# Patient Record
Sex: Female | Born: 1965 | Race: Black or African American | Hispanic: No | Marital: Married | Smoking: Never smoker
Health system: Southern US, Community
[De-identification: ages and names within clinical notes are randomized; demographics above are authoritative.]

## PROBLEM LIST (undated history)

## (undated) DIAGNOSIS — R042 Hemoptysis: Secondary | ICD-10-CM

## (undated) DIAGNOSIS — M109 Gout, unspecified: Secondary | ICD-10-CM

## (undated) DIAGNOSIS — I10 Essential (primary) hypertension: Secondary | ICD-10-CM

## (undated) DIAGNOSIS — E785 Hyperlipidemia, unspecified: Secondary | ICD-10-CM

## (undated) DIAGNOSIS — N12 Tubulo-interstitial nephritis, not specified as acute or chronic: Secondary | ICD-10-CM

## (undated) DIAGNOSIS — A419 Sepsis, unspecified organism: Secondary | ICD-10-CM

## (undated) DIAGNOSIS — E119 Type 2 diabetes mellitus without complications: Secondary | ICD-10-CM

## (undated) DIAGNOSIS — E669 Obesity, unspecified: Secondary | ICD-10-CM

## (undated) DIAGNOSIS — D573 Sickle-cell trait: Secondary | ICD-10-CM

## (undated) HISTORY — DX: Gout, unspecified: M10.9

## (undated) HISTORY — DX: Essential (primary) hypertension: I10

## (undated) HISTORY — DX: Obesity, unspecified: E66.9

---

## 2000-10-15 ENCOUNTER — Encounter: Admission: RE | Admit: 2000-10-15 | Discharge: 2000-10-15 | Payer: Self-pay | Admitting: Obstetrics

## 2000-10-23 ENCOUNTER — Ambulatory Visit (HOSPITAL_COMMUNITY): Admission: RE | Admit: 2000-10-23 | Discharge: 2000-10-23 | Payer: Self-pay | Admitting: Obstetrics

## 2000-10-29 ENCOUNTER — Encounter: Admission: RE | Admit: 2000-10-29 | Discharge: 2000-10-29 | Payer: Self-pay | Admitting: Obstetrics

## 2000-11-12 ENCOUNTER — Inpatient Hospital Stay (HOSPITAL_COMMUNITY): Admission: AD | Admit: 2000-11-12 | Discharge: 2000-11-12 | Payer: Self-pay | Admitting: Obstetrics & Gynecology

## 2000-11-19 ENCOUNTER — Encounter: Admission: RE | Admit: 2000-11-19 | Discharge: 2000-11-19 | Payer: Self-pay | Admitting: Obstetrics

## 2000-12-17 ENCOUNTER — Encounter: Admission: RE | Admit: 2000-12-17 | Discharge: 2000-12-17 | Payer: Self-pay | Admitting: Obstetrics

## 2000-12-31 ENCOUNTER — Encounter: Admission: RE | Admit: 2000-12-31 | Discharge: 2000-12-31 | Payer: Self-pay | Admitting: Obstetrics

## 2001-01-12 ENCOUNTER — Ambulatory Visit (HOSPITAL_COMMUNITY): Admission: RE | Admit: 2001-01-12 | Discharge: 2001-01-12 | Payer: Self-pay | Admitting: Obstetrics & Gynecology

## 2001-01-14 ENCOUNTER — Encounter: Admission: RE | Admit: 2001-01-14 | Discharge: 2001-01-14 | Payer: Self-pay | Admitting: Obstetrics

## 2001-01-19 ENCOUNTER — Encounter: Admission: RE | Admit: 2001-01-19 | Discharge: 2001-01-19 | Payer: Self-pay | Admitting: Internal Medicine

## 2001-01-28 ENCOUNTER — Encounter: Admission: RE | Admit: 2001-01-28 | Discharge: 2001-04-28 | Payer: Self-pay | Admitting: Obstetrics & Gynecology

## 2001-01-28 ENCOUNTER — Encounter (HOSPITAL_COMMUNITY): Admission: RE | Admit: 2001-01-28 | Discharge: 2001-02-27 | Payer: Self-pay | Admitting: Obstetrics

## 2001-01-28 ENCOUNTER — Encounter: Admission: RE | Admit: 2001-01-28 | Discharge: 2001-01-28 | Payer: Self-pay | Admitting: Obstetrics

## 2001-02-03 ENCOUNTER — Encounter: Admission: RE | Admit: 2001-02-03 | Discharge: 2001-02-03 | Payer: Self-pay | Admitting: Obstetrics & Gynecology

## 2001-02-05 ENCOUNTER — Inpatient Hospital Stay (HOSPITAL_COMMUNITY): Admission: AD | Admit: 2001-02-05 | Discharge: 2001-02-08 | Payer: Self-pay | Admitting: *Deleted

## 2001-02-08 ENCOUNTER — Encounter: Payer: Self-pay | Admitting: *Deleted

## 2001-02-10 ENCOUNTER — Encounter: Admission: RE | Admit: 2001-02-10 | Discharge: 2001-02-10 | Payer: Self-pay | Admitting: Obstetrics & Gynecology

## 2001-02-17 ENCOUNTER — Encounter: Admission: RE | Admit: 2001-02-17 | Discharge: 2001-02-17 | Payer: Self-pay | Admitting: Obstetrics & Gynecology

## 2001-02-24 ENCOUNTER — Encounter: Admission: RE | Admit: 2001-02-24 | Discharge: 2001-02-24 | Payer: Self-pay | Admitting: Obstetrics & Gynecology

## 2001-03-03 ENCOUNTER — Encounter: Admission: RE | Admit: 2001-03-03 | Discharge: 2001-03-03 | Payer: Self-pay | Admitting: Obstetrics & Gynecology

## 2001-03-04 ENCOUNTER — Inpatient Hospital Stay (HOSPITAL_COMMUNITY): Admission: AD | Admit: 2001-03-04 | Discharge: 2001-03-07 | Payer: Self-pay | Admitting: *Deleted

## 2004-08-18 HISTORY — PX: TUBAL LIGATION: SHX77

## 2004-12-25 ENCOUNTER — Ambulatory Visit: Payer: Self-pay | Admitting: *Deleted

## 2005-01-01 ENCOUNTER — Ambulatory Visit: Payer: Self-pay | Admitting: Family Medicine

## 2005-01-03 ENCOUNTER — Ambulatory Visit: Payer: Self-pay | Admitting: *Deleted

## 2005-01-08 ENCOUNTER — Ambulatory Visit: Payer: Self-pay | Admitting: Family Medicine

## 2005-01-15 ENCOUNTER — Ambulatory Visit: Payer: Self-pay | Admitting: *Deleted

## 2005-01-22 ENCOUNTER — Ambulatory Visit: Payer: Self-pay | Admitting: *Deleted

## 2005-01-29 ENCOUNTER — Ambulatory Visit: Payer: Self-pay | Admitting: Obstetrics & Gynecology

## 2005-02-04 ENCOUNTER — Ambulatory Visit (HOSPITAL_COMMUNITY): Admission: RE | Admit: 2005-02-04 | Discharge: 2005-02-04 | Payer: Self-pay | Admitting: *Deleted

## 2005-02-05 ENCOUNTER — Ambulatory Visit: Payer: Self-pay | Admitting: *Deleted

## 2005-02-19 ENCOUNTER — Ambulatory Visit: Payer: Self-pay | Admitting: *Deleted

## 2005-03-05 ENCOUNTER — Ambulatory Visit: Payer: Self-pay | Admitting: *Deleted

## 2005-03-12 ENCOUNTER — Ambulatory Visit: Payer: Self-pay | Admitting: Obstetrics & Gynecology

## 2005-03-19 ENCOUNTER — Ambulatory Visit: Payer: Self-pay | Admitting: Obstetrics & Gynecology

## 2005-03-26 ENCOUNTER — Ambulatory Visit: Payer: Self-pay | Admitting: Obstetrics & Gynecology

## 2005-04-02 ENCOUNTER — Ambulatory Visit: Payer: Self-pay | Admitting: Family Medicine

## 2005-04-07 ENCOUNTER — Ambulatory Visit (HOSPITAL_COMMUNITY): Admission: RE | Admit: 2005-04-07 | Discharge: 2005-04-07 | Payer: Self-pay | Admitting: *Deleted

## 2005-04-09 ENCOUNTER — Ambulatory Visit: Payer: Self-pay | Admitting: *Deleted

## 2005-04-11 ENCOUNTER — Ambulatory Visit: Payer: Self-pay | Admitting: *Deleted

## 2005-04-14 ENCOUNTER — Ambulatory Visit (HOSPITAL_COMMUNITY): Admission: RE | Admit: 2005-04-14 | Discharge: 2005-04-14 | Payer: Self-pay | Admitting: *Deleted

## 2005-04-16 ENCOUNTER — Ambulatory Visit: Payer: Self-pay | Admitting: *Deleted

## 2005-04-18 ENCOUNTER — Ambulatory Visit: Payer: Self-pay | Admitting: *Deleted

## 2005-04-18 ENCOUNTER — Ambulatory Visit (HOSPITAL_COMMUNITY): Admission: RE | Admit: 2005-04-18 | Discharge: 2005-04-18 | Payer: Self-pay | Admitting: *Deleted

## 2005-04-23 ENCOUNTER — Ambulatory Visit: Payer: Self-pay | Admitting: *Deleted

## 2005-04-23 ENCOUNTER — Ambulatory Visit (HOSPITAL_COMMUNITY): Admission: RE | Admit: 2005-04-23 | Discharge: 2005-04-23 | Payer: Self-pay | Admitting: *Deleted

## 2005-04-25 ENCOUNTER — Ambulatory Visit (HOSPITAL_COMMUNITY): Admission: RE | Admit: 2005-04-25 | Discharge: 2005-04-25 | Payer: Self-pay | Admitting: *Deleted

## 2005-04-25 ENCOUNTER — Ambulatory Visit: Payer: Self-pay | Admitting: *Deleted

## 2005-04-30 ENCOUNTER — Ambulatory Visit: Payer: Self-pay | Admitting: *Deleted

## 2005-05-02 ENCOUNTER — Ambulatory Visit: Payer: Self-pay | Admitting: *Deleted

## 2005-05-02 ENCOUNTER — Ambulatory Visit (HOSPITAL_COMMUNITY): Admission: RE | Admit: 2005-05-02 | Discharge: 2005-05-02 | Payer: Self-pay | Admitting: *Deleted

## 2005-05-07 ENCOUNTER — Ambulatory Visit: Payer: Self-pay | Admitting: Family Medicine

## 2005-05-09 ENCOUNTER — Ambulatory Visit: Payer: Self-pay | Admitting: *Deleted

## 2005-05-09 ENCOUNTER — Ambulatory Visit (HOSPITAL_COMMUNITY): Admission: RE | Admit: 2005-05-09 | Discharge: 2005-05-09 | Payer: Self-pay | Admitting: *Deleted

## 2005-05-14 ENCOUNTER — Ambulatory Visit: Payer: Self-pay | Admitting: *Deleted

## 2005-05-16 ENCOUNTER — Ambulatory Visit: Payer: Self-pay | Admitting: *Deleted

## 2005-05-16 ENCOUNTER — Ambulatory Visit (HOSPITAL_COMMUNITY): Admission: RE | Admit: 2005-05-16 | Discharge: 2005-05-16 | Payer: Self-pay | Admitting: Family Medicine

## 2005-05-18 ENCOUNTER — Emergency Department (HOSPITAL_COMMUNITY): Admission: EM | Admit: 2005-05-18 | Discharge: 2005-05-18 | Payer: Self-pay | Admitting: *Deleted

## 2005-05-21 ENCOUNTER — Ambulatory Visit: Payer: Self-pay | Admitting: Family Medicine

## 2005-05-21 ENCOUNTER — Inpatient Hospital Stay (HOSPITAL_COMMUNITY): Admission: AD | Admit: 2005-05-21 | Discharge: 2005-05-25 | Payer: Self-pay | Admitting: Family Medicine

## 2005-05-21 ENCOUNTER — Ambulatory Visit: Payer: Self-pay | Admitting: *Deleted

## 2005-05-21 ENCOUNTER — Encounter: Payer: Self-pay | Admitting: Orthopedic Surgery

## 2005-05-24 ENCOUNTER — Encounter (INDEPENDENT_AMBULATORY_CARE_PROVIDER_SITE_OTHER): Payer: Self-pay | Admitting: Specialist
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
## 2007-07-05 ENCOUNTER — Emergency Department (HOSPITAL_COMMUNITY): Admission: EM | Admit: 2007-07-05 | Discharge: 2007-07-05 | Payer: Self-pay | Admitting: *Deleted

## 2007-07-05 ENCOUNTER — Other Ambulatory Visit: Payer: Self-pay | Admitting: Obstetrics and Gynecology

## 2010-06-28 ENCOUNTER — Emergency Department (HOSPITAL_COMMUNITY): Admission: EM | Admit: 2010-06-28 | Discharge: 2010-06-28 | Payer: Self-pay | Admitting: Emergency Medicine

## 2010-09-07 ENCOUNTER — Encounter: Payer: Self-pay | Admitting: *Deleted

## 2010-09-08 ENCOUNTER — Encounter: Payer: Self-pay | Admitting: *Deleted

## 2010-11-29 ENCOUNTER — Emergency Department (INDEPENDENT_AMBULATORY_CARE_PROVIDER_SITE_OTHER): Payer: Self-pay

## 2010-11-29 ENCOUNTER — Emergency Department (HOSPITAL_BASED_OUTPATIENT_CLINIC_OR_DEPARTMENT_OTHER)
Admission: EM | Admit: 2010-11-29 | Discharge: 2010-11-29 | Disposition: A | Discharge status: Home / Self Care | Payer: Self-pay | Attending: Emergency Medicine | Admitting: Emergency Medicine

## 2010-11-29 DIAGNOSIS — R05 Cough: Secondary | ICD-10-CM

## 2010-11-29 DIAGNOSIS — E119 Type 2 diabetes mellitus without complications: Secondary | ICD-10-CM | POA: Insufficient documentation

## 2010-11-29 DIAGNOSIS — R059 Cough, unspecified: Secondary | ICD-10-CM | POA: Insufficient documentation

## 2010-11-29 DIAGNOSIS — R0989 Other specified symptoms and signs involving the circulatory and respiratory systems: Secondary | ICD-10-CM

## 2010-11-29 DIAGNOSIS — R509 Fever, unspecified: Secondary | ICD-10-CM

## 2010-11-29 DIAGNOSIS — I10 Essential (primary) hypertension: Secondary | ICD-10-CM | POA: Insufficient documentation

## 2010-11-29 DIAGNOSIS — B9789 Other viral agents as the cause of diseases classified elsewhere: Secondary | ICD-10-CM | POA: Insufficient documentation

## 2010-11-29 LAB — GLUCOSE, CAPILLARY: Glucose-Capillary: 158 mg/dL — ABNORMAL HIGH (ref 70–99)

## 2010-11-29 LAB — URINALYSIS, ROUTINE W REFLEX MICROSCOPIC
Hgb urine dipstick: NEGATIVE
Nitrite: NEGATIVE

## 2010-12-01 LAB — URINE CULTURE
Colony Count: >=100000
Culture  Setup Time: 201204140603

## 2010-12-09 ENCOUNTER — Encounter: Payer: Self-pay | Admitting: Family Medicine

## 2010-12-09 ENCOUNTER — Ambulatory Visit (INDEPENDENT_AMBULATORY_CARE_PROVIDER_SITE_OTHER): Payer: Self-pay | Admitting: Family Medicine

## 2010-12-09 DIAGNOSIS — R05 Cough: Secondary | ICD-10-CM

## 2010-12-09 DIAGNOSIS — E119 Type 2 diabetes mellitus without complications: Secondary | ICD-10-CM | POA: Insufficient documentation

## 2010-12-09 DIAGNOSIS — E669 Obesity, unspecified: Secondary | ICD-10-CM

## 2010-12-09 HISTORY — DX: Obesity, unspecified: E66.9

## 2010-12-09 NOTE — Patient Instructions (Signed)
It was nice meeting you today.  I am checking your A1C to see how well your diabetes is controlled today.  I would like for you to check your sugars if you can at home.  I would like for you to make an appointment for a physical and pap smear within the next couple of months.  Once you have the orange card please return and have your labs done, we will review them at your physical.

## 2010-12-10 ENCOUNTER — Encounter: Payer: Self-pay | Admitting: Family Medicine

## 2010-12-12 ENCOUNTER — Encounter: Payer: Self-pay | Admitting: Family Medicine

## 2010-12-12 DIAGNOSIS — R05 Cough: Secondary | ICD-10-CM | POA: Insufficient documentation

## 2010-12-12 DIAGNOSIS — R059 Cough, unspecified: Secondary | ICD-10-CM | POA: Insufficient documentation

## 2010-12-12 NOTE — Progress Notes (Signed)
  Subjective:    Patient ID: Brandy Lynn, female    DOB: July 17, 1966, 45 y.o.   MRN: [id number]  HPI Here as new patient to be seen about her diabetes.  Her husband comes to this practice which is why she decided to come here.  Takes metformin for her diabetes.  Is supposed to be taking glipizide as well but states this makes her sick so she stopped it.  She does not check her blood sugars at home because she says that her strips are too expensive.  She is in the process of getting the orange card but needed to be seen here first.  Unsure when her last lab work was.   Also has had cough since last Friday, over the past two days she has had a small amount of blood that she coughs up in the morning. Blood is bright red when she has it. Denies chest pian, sob, n/v, night sweats.  Feels like she may have had fever for one day when cough first started.  Review of Systems See HPI    Objective:   Physical Exam  Constitutional: She appears well-developed and well-nourished. No distress.  HENT:  Head: Normocephalic and atraumatic.  Eyes: EOM are normal. Pupils are equal, round, and reactive to light. Left eye exhibits no discharge.  Neck: Neck supple. No thyromegaly present.  Cardiovascular: Normal rate, regular rhythm, normal heart sounds and intact distal pulses.   Pulmonary/Chest: Effort normal and breath sounds normal.  Abdominal: Soft. Bowel sounds are normal.  Skin: Skin is warm and dry.  Sensory exam of the foot is normal, tested with the monofilament. Good pulses, no lesions or ulcers, good peripheral pulses.         Assessment & Plan:

## 2010-12-12 NOTE — Assessment & Plan Note (Signed)
Encouraged to check glucose if she could.  WIll check A1C today, suspect will be high since she is not taking one of her medications.  Will need to come up with treatment plan for her if this high.  Could consider change to glimeperide to see if she tolerates this better.  Labs to look at kidney function while continuing metformin and lisinopril

## 2010-12-12 NOTE — Assessment & Plan Note (Signed)
Will check CMP and lipid panel once she has orange card, orders entered as future.  Counseled on importance of exercise and dietary changes.

## 2010-12-12 NOTE — Assessment & Plan Note (Signed)
Improving, coughs up small amount of blood in am.  May have some nasal or pharyngeal irritation.  This may be 2/2 to allergies.  No current signs of infection, afebrile, no chest pain, shortness of breath.  Patient does not want anything for this at this time.

## 2010-12-23 ENCOUNTER — Other Ambulatory Visit: Payer: Self-pay

## 2010-12-23 DIAGNOSIS — E119 Type 2 diabetes mellitus without complications: Secondary | ICD-10-CM

## 2010-12-23 DIAGNOSIS — E669 Obesity, unspecified: Secondary | ICD-10-CM

## 2010-12-23 LAB — COMPREHENSIVE METABOLIC PANEL
ALT: 14 U/L (ref 0–35)
AST: 12 U/L (ref 0–37)
Alkaline Phosphatase: 41 U/L (ref 39–117)
BUN: 8 mg/dL (ref 6–23)
Calcium: 9.1 mg/dL (ref 8.4–10.5)
Chloride: 103 mEq/L (ref 96–112)
Creat: 0.61 mg/dL (ref 0.40–1.20)
Potassium: 4.2 mEq/L (ref 3.5–5.3)

## 2010-12-23 LAB — LIPID PANEL
Triglycerides: 67 mg/dL (ref <150)
VLDL: 13 mg/dL (ref 0–40)

## 2010-12-23 NOTE — Progress Notes (Signed)
cmp and flp Hever Castilleja

## 2010-12-30 ENCOUNTER — Encounter: Payer: Self-pay | Admitting: Family Medicine

## 2010-12-30 ENCOUNTER — Other Ambulatory Visit (HOSPITAL_COMMUNITY): Payer: Self-pay | Admitting: Emergency Medicine

## 2010-12-30 ENCOUNTER — Ambulatory Visit (INDEPENDENT_AMBULATORY_CARE_PROVIDER_SITE_OTHER): Payer: Self-pay | Admitting: Family Medicine

## 2010-12-30 VITALS — BP 120/83 | HR 89 | Temp 98.2°F | Ht 64.0 in | Wt 194.0 lb

## 2010-12-30 DIAGNOSIS — E119 Type 2 diabetes mellitus without complications: Secondary | ICD-10-CM

## 2010-12-30 DIAGNOSIS — Z1231 Encounter for screening mammogram for malignant neoplasm of breast: Secondary | ICD-10-CM

## 2010-12-30 NOTE — Patient Instructions (Signed)
It was nice seeing you again today.  If you feel like your sugar is dropping in the morning with the glipizide you can try taking it with your lunch.  Also I would like for you to make an appointment for a complete physical with pap in the next couple of months.  [facility].  I would like to see you back in 3 months to see how your sugars are doing as well.
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
## 2011-01-03 NOTE — Op Note (Signed)
NAMEAVYNN, KLASSEN NO.:  [id number]   MEDICAL RECORD NO.:  [id number]          PATIENT TYPE:  INP   LOCATION:  103                           FACILITY:  [facility]   PHYSICIAN:  Conni Elliot, M.D.DATE OF BIRTH:  1966-04-11   DATE OF PROCEDURE:  05/24/2005  DATE OF DISCHARGE:                                 OPERATIVE REPORT   PREOPERATIVE DIAGNOSIS:  Desire for surgical sterilization.   POSTOPERATIVE DIAGNOSIS:  Desire for surgical sterilization.   OPERATION:  Modified bilateral Pomeroy tubal ligation.   SURGEON:  Conni Elliot, M.D.   FINDINGS:  Uterus and tubes were normal for post gravid state.   DESCRIPTION OF PROCEDURE:  After placing the patient under epidural  anesthetic, patient supine, abdomen was prepped and draped in a sterile  fashion. A paraumbilical incision was made. The incision was made through  the skin and fascia, peritoneal cavity entered. The fallopian tube was  identified, grasped with Babcock clamps, followed to the fimbriated end. A  segment of the tube was brought onto the operative field, doubly suture  ligated. Approximately a __________ segment was excised, hemostasis  adequate. A similar procedure was done on the opposite side, hemostasis  again adequate. The anterior peritoneum, fascia, and subcutaneous skin were  closed in an adequate fashion. Estimated blood loss minimal.           ______________________________  Conni Elliot, M.D.     ASG/MEDQ  D:  05/24/2005  T:  05/24/2005  Job:  [id number]

## 2011-01-09 ENCOUNTER — Ambulatory Visit (HOSPITAL_COMMUNITY)
Admission: RE | Admit: 2011-01-09 | Discharge: 2011-01-09 | Disposition: A | Discharge status: Home / Self Care | Payer: Self-pay | Source: Ambulatory Visit | Attending: Emergency Medicine | Admitting: Emergency Medicine

## 2011-01-09 DIAGNOSIS — Z1231 Encounter for screening mammogram for malignant neoplasm of breast: Secondary | ICD-10-CM | POA: Insufficient documentation

## 2011-01-12 NOTE — Assessment & Plan Note (Signed)
Hgb A1c of 8.4 shows fairly poor glycemic control.  Will have her restart glipizide.  I have doubts that she is truly becoming hypoglycemic especially given her A1c.  Encouraged to get a few strips so that she can at least check her blood glucose when she feel like her sugar is low.  Told her if she does not have a meal in the am can try taking her glipizide closer to lunch time, but may need to take her night time dose later as well.  Will have her follow up in 3 months, and plan to do CPE with pap in the near future.  Given referral slip to have mammogram done.

## 2011-01-12 NOTE — Progress Notes (Signed)
  Subjective:    Patient ID: Brandy Lynn, female    DOB: May 18, 1966, 45 y.o.   MRN: [id number]  HPI Here to discuss 1.Diabetes:  Last A1c showed glycemic control is not as good as it should be.  Currently only taking metformin but supposed to be taking glipizide as well.  Not taking because she feels like her blood sugar drops too low.  Not checking her blood sugar at home when she has what she feels are lows because she states she can not afford the testing strips.  Many times in the morning she was taking her glipizide without eating in the am.  When she feels the lows she says she feels shaky. 2.Recent Labs:  Wanted to know results of most recent labs.  Explained that her cholesterol looked ok and explained what goals she may want to consider being diabetic.    Review of Systems   Denies nausea, vomiting, diarrhea, chest pain, headache, cough, abdominal pain Objective:   Physical Exam  Constitutional: She appears well-developed and well-nourished. No distress.  Cardiovascular: Normal rate and normal heart sounds.   Skin: Skin is warm and dry.          Assessment & Plan:

## 2011-01-21 ENCOUNTER — Encounter: Payer: Self-pay | Admitting: Family Medicine

## 2011-04-02 ENCOUNTER — Encounter: Payer: Self-pay | Admitting: Family Medicine

## 2011-04-02 ENCOUNTER — Ambulatory Visit (INDEPENDENT_AMBULATORY_CARE_PROVIDER_SITE_OTHER): Payer: Self-pay | Admitting: Family Medicine

## 2011-04-02 DIAGNOSIS — E119 Type 2 diabetes mellitus without complications: Secondary | ICD-10-CM

## 2011-04-02 NOTE — Progress Notes (Signed)
  Subjective:    Patient ID: Brandy Lynn, female    DOB: 1966/03/20, 45 y.o.   MRN: [id number]  HPI 1. Diabetes:  Unsure what her blood glucose is running at home as she does not have a meter to check her blood sugars.  She has also not been taking her medications in the morning because she has currently been fasting due to Ramadan.  This is further complicated by her binge eating at night.  She is currently trying to find an eye doctor for he diabetic visio exam.  She denies chest pain, nausea, polyuria, polydipsia, vision change, headache.   Review of Systems Per HPI    Objective:   Physical Exam  Constitutional:       Obese female, NAD  Cardiovascular: Normal rate, regular rhythm and normal heart sounds.  Exam reveals no gallop and no friction rub.   No murmur heard. Pulmonary/Chest: Effort normal and breath sounds normal.          Assessment & Plan:

## 2011-04-02 NOTE — Assessment & Plan Note (Signed)
HgbA1C of 9.2 showing poor glycemic control.  Explained to her that she should at least try to take her metformin in the morning even though she is fasting.  She states that she could eat during the day if it was not healthy for her to fast, I explained to her that this would be better for her in controlling her diabetes and would allow for her to get all of her medicines in during the day.  Patient given meter today to monitor glucose.  Will f/u in 3 months.

## 2011-04-03 ENCOUNTER — Other Ambulatory Visit: Payer: Self-pay | Admitting: Family Medicine

## 2011-04-03 MED ORDER — GLIPIZIDE 5 MG PO TABS: 5.0000 mg | ORAL_TABLET | Freq: Two times a day (BID) | ORAL | Status: DC

## 2011-04-03 MED ORDER — LISINOPRIL 5 MG PO TABS: 5.0000 mg | ORAL_TABLET | Freq: Every day | ORAL | Status: DC

## 2011-04-03 MED ORDER — METFORMIN HCL 1000 MG PO TABS: 1000.0000 mg | ORAL_TABLET | Freq: Two times a day (BID) | ORAL | Status: DC

## 2011-04-03 NOTE — Telephone Encounter (Signed)
Completed.

## 2011-04-03 NOTE — Telephone Encounter (Signed)
Was in yesterday and Glipizide, Metformin and Lisinopril were to be sent to her Pharmacy, but they have not been received yet.  Please call her when this has been done.

## 2011-04-03 NOTE — Telephone Encounter (Signed)
Will forward to Dr Matthews 

## 2011-05-09 ENCOUNTER — Ambulatory Visit (INDEPENDENT_AMBULATORY_CARE_PROVIDER_SITE_OTHER): Payer: Self-pay | Admitting: Family Medicine

## 2011-05-09 ENCOUNTER — Encounter: Payer: Self-pay | Admitting: Family Medicine

## 2011-05-09 ENCOUNTER — Other Ambulatory Visit (HOSPITAL_COMMUNITY)
Admission: RE | Admit: 2011-05-09 | Discharge: 2011-05-09 | Disposition: A | Discharge status: Home / Self Care | Payer: Self-pay | Source: Ambulatory Visit | Attending: Family Medicine | Admitting: Family Medicine

## 2011-05-09 VITALS — BP 117/78 | HR 85 | Temp 98.1°F | Wt 195.0 lb

## 2011-05-09 DIAGNOSIS — Z124 Encounter for screening for malignant neoplasm of cervix: Secondary | ICD-10-CM

## 2011-05-09 DIAGNOSIS — Z01419 Encounter for gynecological examination (general) (routine) without abnormal findings: Secondary | ICD-10-CM | POA: Insufficient documentation

## 2011-05-09 NOTE — Patient Instructions (Signed)
It was good seeing you today.  I will let you know the results of your pap when it returns.  Please follow up in two months for your diabetes. I hope you have a nice day.

## 2011-05-11 NOTE — Progress Notes (Signed)
  Subjective:     Brandy Lynn is a 45 y.o. female and is here for a comprehensive physical exam. -Diabetes:  Sugars doing better since she has stopped fasting during the day.  She is still only taking her glipizide in the evening.  She also reports some bloating after eating and some nausea when she first wakes in the morning.  Her sugars were running in the 200's a few weeks ago, but most recently she has been getting more numbers in the 100's, unable to recall exact numbers however.   History   Social History  . Marital Status: Married    Spouse Name: N/A    Number of Children: N/A  . Years of Education: N/A   Occupational History  . Not on file.   Social History Main Topics  . Smoking status: Never Smoker   . Smokeless tobacco: Not on file  . Alcohol Use: No  . Drug Use: No  . Sexually Active: Not on file   Other Topics Concern  . Not on file   Social History Narrative   Originally from [place].  Married.  No substance abuse.  Does not exercise regularly. Employed as Producer, television/film/video.   Health Maintenance  Topic Date Due  . Tetanus/tdap  02/18/1985  . Pap Smear  12/11/2012    The following portions of the patient's history were reviewed and updated as appropriate: allergies, current medications, past family history, past medical history, past social history, past surgical history and problem list.  Review of Systems A comprehensive review of systems was negative.   Objective:    BP 117/78  Pulse 85  Temp(Src) 98.1 F (36.7 C) (Oral)  Wt 195 lb (88.451 kg)  LMP 04/08/2011 General appearance: alert, cooperative, no distress and moderately obese Head: Normocephalic, without obvious abnormality, atraumatic Eyes: conjunctivae/corneas clear. PERRL, EOM's intact. Fundi benign. Ears: normal TM's and external ear canals both ears Nose: Nares normal. Septum midline. Mucosa normal. No drainage or sinus tenderness. Throat: lips, mucosa, and tongue normal; teeth and gums  normal Neck: no adenopathy, no carotid bruit, no JVD, supple, symmetrical, trachea midline and thyroid not enlarged, symmetric, no tenderness/mass/nodules Lungs: clear to auscultation bilaterally Heart: regular rate and rhythm, S1, S2 normal, no murmur, click, rub or gallop Abdomen: normal findings: bowel sounds normal, liver span normal to percussion and no organomegaly and abnormal findings:  mild tenderness in the epigastrium Pelvic: cervix normal in appearance, external genitalia normal, no adnexal masses or tenderness, no cervical motion tenderness, rectovaginal septum normal, uterus normal size, shape, and consistency and vagina normal without discharge Extremities: extremities normal, atraumatic, no cyanosis or edema Pulses: 2+ and symmetric Skin: Skin color, texture, turgor normal. No rashes or lesions Lymph nodes: Cervical, supraclavicular, and axillary nodes normal. Neurologic: Grossly normal    Assessment:    Healthy female exam.      Plan:     See After Visit Summary for Counseling Recommendations  Pap done. Diabetic foot exam done Follow up in two months for her diabetes and HTN, BP looks good today.  Recheck A1C at that time.

## 2011-05-20 ENCOUNTER — Encounter: Payer: Self-pay | Admitting: Family Medicine

## 2011-05-27 LAB — URINALYSIS, ROUTINE W REFLEX MICROSCOPIC
Glucose, UA: >1000 — AB
Glucose, UA: >1000 — AB
Hgb urine dipstick: NEGATIVE
Ketones, ur: NEGATIVE
Leukocytes, UA: NEGATIVE
Protein, ur: NEGATIVE
Protein, ur: NEGATIVE
Specific Gravity, Urine: 1.01

## 2011-05-27 LAB — CBC
HCT: 38.3
Hemoglobin: 13.5
WBC: 7.4

## 2011-05-27 LAB — BASIC METABOLIC PANEL
GFR calc Af Amer: >60
GFR calc non Af Amer: >60
Potassium: 3.4 — ABNORMAL LOW

## 2011-05-27 LAB — DIFFERENTIAL
Eosinophils Relative: 2
Lymphocytes Relative: 35
Lymphs Abs: 2.6
Monocytes Absolute: 0.4

## 2011-05-27 LAB — URINE MICROSCOPIC-ADD ON

## 2011-05-27 LAB — PREGNANCY, URINE: Preg Test, Ur: NEGATIVE

## 2011-07-23 ENCOUNTER — Other Ambulatory Visit: Payer: Self-pay | Admitting: Family Medicine

## 2011-07-23 NOTE — Telephone Encounter (Signed)
Refill request

## 2011-08-07 ENCOUNTER — Other Ambulatory Visit: Payer: Self-pay | Admitting: Family Medicine

## 2011-08-07 NOTE — Telephone Encounter (Signed)
Refill request

## 2011-09-17 ENCOUNTER — Ambulatory Visit: Payer: Self-pay | Admitting: Family Medicine

## 2011-09-18 ENCOUNTER — Ambulatory Visit (INDEPENDENT_AMBULATORY_CARE_PROVIDER_SITE_OTHER): Payer: Self-pay | Admitting: Family Medicine

## 2011-09-18 ENCOUNTER — Encounter: Payer: Self-pay | Admitting: Family Medicine

## 2011-09-18 VITALS — BP 146/80 | HR 84 | Temp 97.8°F | Ht 64.0 in | Wt 197.4 lb

## 2011-09-18 DIAGNOSIS — E119 Type 2 diabetes mellitus without complications: Secondary | ICD-10-CM

## 2011-09-18 NOTE — Patient Instructions (Signed)
Thank you for coming in today, it was good to see you Your A1c is 8.8 today which is a little better than before I would like for you to take the glipzide two times per day with a meal I would like for you to record your blood sugars daily for the next two weeks and bring them in for [place] to review.  We still have some room to increase the glipizide if we need to. If we can't control your sugar with metformin/glipizide we may need to talk about starting insulin.

## 2011-09-29 NOTE — Assessment & Plan Note (Signed)
A1C mildly improved form previous.  Instructed to start taking glipizide twice daily.  Continue with diet and exercise changes, limit carbohydrates.

## 2011-09-29 NOTE — Progress Notes (Signed)
  Subjective:    Patient ID: Brandy Lynn, female    DOB: 09/27/1965, 46 y.o.   MRN: [id number]  HPI 1. DM:  Concerned that blood sugar had been running higher over the past few weeks.  Getting readings of >250 when she checks.  Has not checked CBG today  Typically <200.  She is taking metformin tiwce daily bu only taking glipizide at night.  She denies any increased thirst or urination.  Has had headache.  She has changed her diet and is trying to eat more salads but she still eats a lot of bread.  She has recently bought a Photographer and has went once so far, but plans to go more often   Review of Systems     Objective:   Physical Exam  Constitutional: She is oriented to person, place, and time. She appears well-nourished. No distress.  Cardiovascular: Normal rate, regular rhythm and normal heart sounds.   Pulmonary/Chest: Effort normal and breath sounds normal. No respiratory distress.  Musculoskeletal: She exhibits no edema.  Neurological: She is alert and oriented to person, place, and time.          Assessment & Plan:

## 2011-09-30 ENCOUNTER — Other Ambulatory Visit: Payer: Self-pay | Admitting: Family Medicine

## 2011-09-30 NOTE — Telephone Encounter (Signed)
Refill request

## 2011-10-01 ENCOUNTER — Ambulatory Visit (INDEPENDENT_AMBULATORY_CARE_PROVIDER_SITE_OTHER): Payer: Self-pay | Admitting: Family Medicine

## 2011-10-01 VITALS — BP 144/82 | HR 84 | Temp 99.0°F | Ht 64.0 in | Wt 198.0 lb

## 2011-10-01 DIAGNOSIS — J020 Streptococcal pharyngitis: Secondary | ICD-10-CM

## 2011-10-01 DIAGNOSIS — E119 Type 2 diabetes mellitus without complications: Secondary | ICD-10-CM

## 2011-10-01 DIAGNOSIS — J029 Acute pharyngitis, unspecified: Secondary | ICD-10-CM

## 2011-10-01 MED ORDER — IBUPROFEN 800 MG PO TABS: 800.0000 mg | ORAL_TABLET | Freq: Three times a day (TID) | ORAL | Status: DC | PRN

## 2011-10-01 MED ORDER — PENICILLIN G BENZATHINE 1200000 UNIT/2ML IM SUSP
1.2000 10*6.[IU] | Freq: Once | INTRAMUSCULAR | Status: AC
  Administered 2011-10-01: 1.2 10*6.[IU] via INTRAMUSCULAR

## 2011-10-06 ENCOUNTER — Encounter: Payer: Self-pay | Admitting: Family Medicine

## 2011-10-06 ENCOUNTER — Ambulatory Visit (INDEPENDENT_AMBULATORY_CARE_PROVIDER_SITE_OTHER): Payer: Self-pay | Admitting: Family Medicine

## 2011-10-06 VITALS — BP 147/84 | HR 101 | Temp 98.4°F | Ht 64.0 in | Wt 197.0 lb

## 2011-10-06 DIAGNOSIS — L03019 Cellulitis of unspecified finger: Secondary | ICD-10-CM

## 2011-10-06 DIAGNOSIS — IMO0001 Reserved for inherently not codable concepts without codable children: Secondary | ICD-10-CM

## 2011-10-06 MED ORDER — SULFAMETHOXAZOLE-TRIMETHOPRIM 800-160 MG PO TABS: 1.0000 | ORAL_TABLET | Freq: Two times a day (BID) | ORAL | Status: AC

## 2011-10-06 NOTE — Patient Instructions (Signed)
I am sorry that you were in so much pain today. I am sending you home with antibiotics. Please come back on Friday if your finger is not better.

## 2011-10-06 NOTE — Assessment & Plan Note (Signed)
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  Tried to drain with the 11 blade. No pus drained. Will give antibiotic x5 days. Patient to return on Friday if finger has swollen or if not improved.

## 2011-10-06 NOTE — Progress Notes (Signed)
  Subjective:    Patient ID: Brandy Lynn, female    DOB: Jan 13, 1966, 47 y.o.   MRN: [id number]  HPI Patient presents with 3-4 days of finger swelling and pain. 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 She does not bite her nails. She has relatively controlled diabetes last A1c of 8.8. She has not had any drainage from the finger. She denies any fevers or chills.   Review of Systems    see above Objective:   Physical Exam Vital signs reviewed General appearance - alert, well appearing, and in no distress and oriented to person, place, and time Finger- 2nd finger left hand is very painful to first knuckle.  Mildly swollen but not erythematous. Pt was consented for procedure including risks of bleeding and infection.  The area was cleaned with alcohol.  The I and D was done with an 11 blade needle. No pus is draining        Assessment & Plan:

## 2011-10-09 ENCOUNTER — Encounter (HOSPITAL_BASED_OUTPATIENT_CLINIC_OR_DEPARTMENT_OTHER): Payer: Self-pay | Admitting: *Deleted

## 2011-10-09 ENCOUNTER — Emergency Department (HOSPITAL_BASED_OUTPATIENT_CLINIC_OR_DEPARTMENT_OTHER)
Admission: EM | Admit: 2011-10-09 | Discharge: 2011-10-10 | Disposition: A | Discharge status: Home / Self Care | Payer: Self-pay | Attending: Emergency Medicine | Admitting: Emergency Medicine

## 2011-10-09 DIAGNOSIS — R1031 Right lower quadrant pain: Secondary | ICD-10-CM | POA: Insufficient documentation

## 2011-10-09 DIAGNOSIS — E119 Type 2 diabetes mellitus without complications: Secondary | ICD-10-CM | POA: Insufficient documentation

## 2011-10-09 DIAGNOSIS — Z9851 Tubal ligation status: Secondary | ICD-10-CM | POA: Insufficient documentation

## 2011-10-09 DIAGNOSIS — R10819 Abdominal tenderness, unspecified site: Secondary | ICD-10-CM | POA: Insufficient documentation

## 2011-10-09 LAB — CBC
Hemoglobin: 11 g/dL — ABNORMAL LOW (ref 12.0–15.0)
RBC: 3.78 MIL/uL — ABNORMAL LOW (ref 3.87–5.11)

## 2011-10-09 LAB — PREGNANCY, URINE: Preg Test, Ur: NEGATIVE

## 2011-10-09 NOTE — Assessment & Plan Note (Addendum)
Reports improved glucose.  Asked her to log sugars to bring with her to her next appointment.  F/u in 3 months.

## 2011-10-09 NOTE — Assessment & Plan Note (Signed)
Strep +, will treat with bicillin injection and ibuprofen for pain control

## 2011-10-09 NOTE — Progress Notes (Signed)
  Subjective:    Patient ID: Brandy Lynn, female    DOB: 11/17/1965, 46 y.o.   MRN: [id number]  HPI  1. Sore throat:  Has had sore throat x3 days.  Also has had fever and felt more tired than usual.  Endorses cough as well as bodyaches.  Her daugter was recently dx with strep throat.  She denies any headache, shortness of breath, nausea or vomiting  2. DM:  States sugars are better since taking glipizide twice per day.  She has not taken in two days however because she has not been eating much 2/2 to illness.  When she has checked her sugars on the medication it has been in the mid to low 100's.  Review of Systems     Objective:   Physical Exam  Constitutional: No distress.       Ill appearing  HENT:  Head: Normocephalic.  Right Ear: Tympanic membrane and ear canal normal.  Left Ear: Tympanic membrane and ear canal normal.  Nose: No rhinorrhea.  Mouth/Throat: Posterior oropharyngeal edema and posterior oropharyngeal erythema present. No oropharyngeal exudate.  Cardiovascular: Normal rate and normal heart sounds.   Pulmonary/Chest: Effort normal and breath sounds normal.  Lymphadenopathy:    She has cervical adenopathy.          Assessment & Plan:

## 2011-10-09 NOTE — ED Notes (Signed)
Pt with right LQ pain since last PM pt describes pain as sharp and stabbing denies urinary sx, fever or N/V  LNBM this am last MP last month

## 2011-10-10 ENCOUNTER — Emergency Department (INDEPENDENT_AMBULATORY_CARE_PROVIDER_SITE_OTHER): Payer: Self-pay

## 2011-10-10 DIAGNOSIS — K5730 Diverticulosis of large intestine without perforation or abscess without bleeding: Secondary | ICD-10-CM

## 2011-10-10 DIAGNOSIS — R112 Nausea with vomiting, unspecified: Secondary | ICD-10-CM

## 2011-10-10 DIAGNOSIS — R1031 Right lower quadrant pain: Secondary | ICD-10-CM

## 2011-10-10 LAB — URINALYSIS, ROUTINE W REFLEX MICROSCOPIC
Glucose, UA: >1000 mg/dL — AB
Ketones, ur: NEGATIVE mg/dL
Leukocytes, UA: NEGATIVE
pH: 6.5 (ref 5.0–8.0)

## 2011-10-10 LAB — COMPREHENSIVE METABOLIC PANEL
ALT: 17 U/L (ref 0–35)
AST: 14 U/L (ref 0–37)
CO2: 29 mEq/L (ref 19–32)
Calcium: 9.3 mg/dL (ref 8.4–10.5)
Chloride: 101 mEq/L (ref 96–112)
GFR calc non Af Amer: 88 mL/min — ABNORMAL LOW (ref >90)
Sodium: 138 mEq/L (ref 135–145)
Total Bilirubin: 0.1 mg/dL — ABNORMAL LOW (ref 0.3–1.2)

## 2011-10-10 LAB — DIFFERENTIAL
Basophils Relative: 0 % (ref 0–1)
Eosinophils Relative: 2 % (ref 0–5)
Lymphocytes Relative: 40 % (ref 12–46)
Neutrophils Relative %: 49 % (ref 43–77)

## 2011-10-10 LAB — URINE MICROSCOPIC-ADD ON: RBC / HPF: NONE SEEN RBC/hpf (ref <3)

## 2011-10-10 MED ORDER — SODIUM CHLORIDE 0.9 % IV BOLUS (SEPSIS)
1000.0000 mL | Freq: Once | INTRAVENOUS | Status: AC
  Administered 2011-10-10: 1000 mL via INTRAVENOUS

## 2011-10-10 MED ORDER — ONDANSETRON HCL 4 MG/2ML IJ SOLN
4.0000 mg | Freq: Once | INTRAMUSCULAR | Status: AC
  Administered 2011-10-10: 4 mg via INTRAVENOUS
  Filled 2011-10-10: qty 2

## 2011-10-10 MED ORDER — CEPHALEXIN 500 MG PO CAPS: 500.0000 mg | ORAL_CAPSULE | Freq: Four times a day (QID) | ORAL | Status: AC

## 2011-10-10 MED ORDER — MORPHINE SULFATE 4 MG/ML IJ SOLN
4.0000 mg | Freq: Once | INTRAMUSCULAR | Status: AC
  Administered 2011-10-10: 4 mg via INTRAVENOUS
  Filled 2011-10-10: qty 1

## 2011-10-10 MED ORDER — IOHEXOL 300 MG/ML  SOLN
100.0000 mL | Freq: Once | INTRAMUSCULAR | Status: AC | PRN
  Administered 2011-10-10: 100 mL via INTRAVENOUS

## 2011-10-10 MED ORDER — OXYCODONE-ACETAMINOPHEN 5-325 MG PO TABS: 1.0000 | ORAL_TABLET | ORAL | Status: AC | PRN

## 2011-10-10 MED ORDER — IOHEXOL 300 MG/ML  SOLN
20.0000 mL | Freq: Once | INTRAMUSCULAR | Status: AC | PRN
  Administered 2011-10-10: 20 mL via ORAL

## 2011-10-10 NOTE — ED Provider Notes (Signed)
History     CSN: [id number]  Arrival date & time 10/09/11  2307   First MD Initiated Contact with Patient 10/10/11 0053      Chief Complaint  Patient presents with  . Abdominal Pain    (Consider location/radiation/quality/duration/timing/severity/associated sxs/prior treatment) HPI  C/o RLQ stabbing pain x 2 days. Took motrin without relief. Worse with movement of RLE. Denies N/V/constip/diarrhea/bloody stool. Denies fever. Denies hematuria/dysuria/freq/urgency. Denies vaginal discharge. Normal appetite. Tolerating PO without difficulty. Pain 8/10 at this time. No h/o STI.  No h/o abdominal surgeries except BTL   Past Medical History  Diagnosis Date  . T2DM (type 2 diabetes mellitus)     Past Surgical History  Procedure Date  . Tubal ligation     Family History  Problem Relation Age of Onset  . Diabetes Father   . Diabetes Sister   . Diabetes Brother   . Breast cancer      History  Substance Use Topics  . Smoking status: Never Smoker   . Smokeless tobacco: Not on file  . Alcohol Use: No    OB History    Grav Para Term Preterm Abortions TAB SAB Ect Mult Living                  Review of Systems  All other systems reviewed and are negative.   except as noted HPI   Allergies  Review of patient's allergies indicates no known allergies.  Home Medications   Current Outpatient Rx  Name Route Sig Dispense Refill  . CEPHALEXIN 500 MG PO CAPS Oral Take 1 capsule (500 mg total) by mouth 4 (four) times daily. 40 capsule 0  . VITAMIN D3 1000 UNITS PO CAPS Oral Take 1,000 mg by mouth daily.      Marland Kitchen FERROUS GLUCONATE 325 MG PO TABS Oral Take 325 mg by mouth daily with breakfast.      . GLIPIZIDE 5 MG PO TABS  TAKE ONE TABLET BY MOUTH TWICE DAILY BEFORE MEALS 60 tablet 3  . IBUPROFEN 800 MG PO TABS Oral Take 1 tablet (800 mg total) by mouth every 8 (eight) hours as needed for pain. 20 tablet 0  . LISINOPRIL 5 MG PO TABS  TAKE ONE TABLET BY MOUTH EVERY DAY 30  tablet 3  . METFORMIN HCL 1000 MG PO TABS  TAKE ONE TABLET BY MOUTH TWICE DAILY WITH FOOD 60 tablet 2  . OXYCODONE-ACETAMINOPHEN 5-325 MG PO TABS Oral Take 1 tablet by mouth every 4 (four) hours as needed for pain. 15 tablet 0  . SULFAMETHOXAZOLE-TRIMETHOPRIM 800-160 MG PO TABS Oral Take 1 tablet by mouth 2 (two) times daily. 10 tablet 0    BP 136/83  Pulse 78  Temp(Src) 98.1 F (36.7 C) (Oral)  Resp 18  SpO2 100%  LMP 09/29/2011  Physical Exam  Nursing note and vitals reviewed. Constitutional: She is oriented to person, place, and time. She appears well-developed.  HENT:  Head: Atraumatic.  Mouth/Throat: Oropharynx is clear and moist.  Eyes: Conjunctivae and EOM are normal. Pupils are equal, round, and reactive to light.  Neck: Normal range of motion. Neck supple.  Cardiovascular: Normal rate, regular rhythm, normal heart sounds and intact distal pulses.   Pulmonary/Chest: Effort normal and breath sounds normal. No respiratory distress. She has no wheezes. She has no rales.  Abdominal: Soft. She exhibits no distension. There is tenderness. There is no rebound and no guarding.       +RLQ pain, r/g +r groin/R mons pubis  ttp, no induration or erythema Perineum unremarkable  Genitourinary: No vaginal discharge found.       No CMT No R/L adnexal ttp  Musculoskeletal: Normal range of motion.  Neurological: She is alert and oriented to person, place, and time.  Skin: Skin is warm and dry. No rash noted.  Psychiatric: She has a normal mood and affect.    ED Course  Procedures (including critical care time)  Labs Reviewed  URINALYSIS, ROUTINE W REFLEX MICROSCOPIC - Abnormal; Notable for the following:    Glucose, UA >1000 (*)    All other components within normal limits  CBC - Abnormal; Notable for the following:    RBC 3.78 (*)    Hemoglobin 11.0 (*)    HCT 31.7 (*)    All other components within normal limits  COMPREHENSIVE METABOLIC PANEL - Abnormal; Notable for the  following:    Glucose, Bld 238 (*)    Total Bilirubin 0.1 (*)    GFR calc non Af Amer 88 (*)    All other components within normal limits  URINE MICROSCOPIC-ADD ON - Abnormal; Notable for the following:    Squamous Epithelial / LPF FEW (*)    All other components within normal limits  PREGNANCY, URINE  DIFFERENTIAL   Ct Abdomen Pelvis W Contrast  10/10/2011  *RADIOLOGY REPORT*  Clinical Data: Right lower quadrant abdominal pain, nausea and vomiting.  CT ABDOMEN AND PELVIS WITH CONTRAST  Technique:  Multidetector CT imaging of the abdomen and pelvis was performed following the standard protocol during bolus administration of intravenous contrast.  Contrast: 100 mL of Omnipaque 300 IV contrast  Comparison: Pelvic ultrasound and MRI of the lumbar spine performed 05/21/2005  Findings: Minimal bibasilar atelectasis is noted.  The liver and spleen are unremarkable in appearance.  The gallbladder is within normal limits.  The pancreas and adrenal glands are unremarkable.  The kidneys are unremarkable in appearance.  There is no evidence of hydronephrosis.  No renal or ureteral stones are seen.  No perinephric stranding is appreciated.  No free fluid is identified.  The small bowel is unremarkable in appearance.  The stomach is within normal limits.  No acute vascular abnormalities are seen.  The appendix is normal in caliber and contains air, without evidence for appendicitis.  No significant soft tissue stranding is seen.  Scattered diverticulosis is noted along the distal descending and proximal sigmoid colon, without evidence of diverticulitis.  The colon is otherwise unremarkable in appearance.  The bladder is moderately distended and grossly unremarkable in appearance.  The uterus is within normal limits.  The ovaries are relatively symmetric; no suspicious adnexal masses are seen.  Numerous large calcifications within the flanks bilaterally may reflect injection granulomata.  Mild focal soft tissue  inflammation is noted at the right inguinal region, with a few scattered small right inguinal nodes, slightly larger than on the left.  No inguinal lymphadenopathy is seen.  No acute osseous abnormalities are identified.  IMPRESSION:  1.  No evidence of appendicitis. 2.  Mild focal soft tissue inflammation at the right inguinal region, with a few scattered small right inguinal nodes; no evidence of inguinal lymphadenopathy.  3.  Scattered diverticulosis along the distal descending and proximal sigmoid colon, without evidence of diverticulitis. 4.  Large calcifications within the flanks bilaterally may reflect injection granulomata.  Original Report Authenticated By: Tonia Ghent, M.D.     1. Abdominal pain     MDM  Small focus of inflammation noted on CT scan as  above. No obvious cellulitis by clinical exam. No fournier's gangrene noted. Pain controlled with morphine given during ED course. Feels comfortable with discharge home, Keflex, pain control,  f/u PMD 3 days (Monday), given strict precautions for return.         Forbes Cellar, MD 10/10/11 [phone]

## 2011-10-10 NOTE — Discharge Instructions (Signed)

## 2011-10-15 ENCOUNTER — Encounter: Payer: Self-pay | Admitting: Family Medicine

## 2011-10-15 ENCOUNTER — Ambulatory Visit (INDEPENDENT_AMBULATORY_CARE_PROVIDER_SITE_OTHER): Payer: Self-pay | Admitting: Family Medicine

## 2011-10-15 DIAGNOSIS — R109 Unspecified abdominal pain: Secondary | ICD-10-CM

## 2011-10-15 NOTE — Patient Instructions (Signed)
Thank you for coming in today, it was good to see you I would like for you to try a medication called omeprazole, you can get this over the counter for around $10-20 per month,  to see if that helps your stomach Complete your course of Keflex that was prescribed to you in the ED If you start to feel worse, have vomiting, increased pain, diarrhea, or blood in your stool come back in to be seen.

## 2011-10-22 DIAGNOSIS — R109 Unspecified abdominal pain: Secondary | ICD-10-CM | POA: Insufficient documentation

## 2011-10-22 NOTE — Assessment & Plan Note (Signed)
Seems to be resolved at this point, advised to complete antibiotics.  Unsure of initial etiology or cause of soft tissue inflammation in inguinal area.  Told to return if worsening or symptoms change.

## 2011-10-22 NOTE — Progress Notes (Signed)
  Subjective:    Patient ID: Brandy Lynn, female    DOB: 06-05-1966, 46 y.o.   MRN: [id number]  HPI 1. F/u Abd pain:  Went to ED on 2/21 with complain of LRQ abdominal pain.  CT scan at that time showed no appendicitis but did have some soft tissue inflammation in the R inguinal region.  There was no evidence of abscess or cellulitis on exam, however she was started on cephalexin at that time. Today her pain is much improved.  She denies nausea, vomiting, diarrhea, or fever.     Review of Systems     Objective:   Physical Exam  Constitutional: She appears well-developed and well-nourished. No distress.  Abdominal: Soft. Bowel sounds are normal. She exhibits no distension. There is no tenderness. There is no rebound and no guarding.  Skin:       No overlying cellulitis or abscess in inguinal region.  No tenderness to palpation in inguinal area.           Assessment & Plan:

## 2011-12-03 ENCOUNTER — Other Ambulatory Visit: Payer: Self-pay | Admitting: Family Medicine

## 2011-12-10 ENCOUNTER — Other Ambulatory Visit (HOSPITAL_COMMUNITY): Payer: Self-pay | Admitting: Emergency Medicine

## 2011-12-10 DIAGNOSIS — Z1231 Encounter for screening mammogram for malignant neoplasm of breast: Secondary | ICD-10-CM

## 2012-01-14 ENCOUNTER — Ambulatory Visit (HOSPITAL_COMMUNITY)
Admission: RE | Admit: 2012-01-14 | Discharge: 2012-01-14 | Disposition: A | Discharge status: Home / Self Care | Payer: Self-pay | Source: Ambulatory Visit | Attending: Emergency Medicine | Admitting: Emergency Medicine

## 2012-01-14 DIAGNOSIS — Z1231 Encounter for screening mammogram for malignant neoplasm of breast: Secondary | ICD-10-CM | POA: Insufficient documentation

## 2012-01-30 ENCOUNTER — Emergency Department (HOSPITAL_BASED_OUTPATIENT_CLINIC_OR_DEPARTMENT_OTHER): Payer: Self-pay

## 2012-01-30 ENCOUNTER — Emergency Department (HOSPITAL_BASED_OUTPATIENT_CLINIC_OR_DEPARTMENT_OTHER)
Admission: EM | Admit: 2012-01-30 | Discharge: 2012-01-30 | Disposition: A | Discharge status: Home / Self Care | Payer: Self-pay | Attending: Emergency Medicine | Admitting: Emergency Medicine

## 2012-01-30 ENCOUNTER — Other Ambulatory Visit: Payer: Self-pay

## 2012-01-30 ENCOUNTER — Ambulatory Visit: Payer: Self-pay | Admitting: Family Medicine

## 2012-01-30 ENCOUNTER — Encounter (HOSPITAL_BASED_OUTPATIENT_CLINIC_OR_DEPARTMENT_OTHER): Payer: Self-pay | Admitting: *Deleted

## 2012-01-30 DIAGNOSIS — E119 Type 2 diabetes mellitus without complications: Secondary | ICD-10-CM | POA: Insufficient documentation

## 2012-01-30 DIAGNOSIS — R079 Chest pain, unspecified: Secondary | ICD-10-CM | POA: Insufficient documentation

## 2012-01-30 LAB — CBC
Hemoglobin: 13.3 g/dL (ref 12.0–15.0)
MCH: 30 pg (ref 26.0–34.0)
RBC: 4.44 MIL/uL (ref 3.87–5.11)

## 2012-01-30 LAB — BASIC METABOLIC PANEL
BUN: 8 mg/dL (ref 6–23)
CO2: 27 mEq/L (ref 19–32)
Chloride: 96 mEq/L (ref 96–112)
GFR calc non Af Amer: >90 mL/min (ref >90)
Glucose, Bld: 178 mg/dL — ABNORMAL HIGH (ref 70–99)
Potassium: 3.8 mEq/L (ref 3.5–5.1)

## 2012-01-30 LAB — DIFFERENTIAL
Eosinophils Absolute: 0.1 10*3/uL (ref 0.0–0.7)
Lymphocytes Relative: 38 % (ref 12–46)
Lymphs Abs: 3.2 10*3/uL (ref 0.7–4.0)
Monocytes Relative: 8 % (ref 3–12)
Neutrophils Relative %: 53 % (ref 43–77)

## 2012-01-30 MED ORDER — ONDANSETRON HCL 4 MG/2ML IJ SOLN
4.0000 mg | Freq: Once | INTRAMUSCULAR | Status: AC
  Administered 2012-01-30: 4 mg via INTRAVENOUS
  Filled 2012-01-30: qty 2

## 2012-01-30 MED ORDER — MORPHINE SULFATE 4 MG/ML IJ SOLN
4.0000 mg | Freq: Once | INTRAMUSCULAR | Status: AC
  Administered 2012-01-30: 4 mg via INTRAVENOUS
  Filled 2012-01-30: qty 1

## 2012-01-30 MED ORDER — HYDROCODONE-ACETAMINOPHEN 5-500 MG PO TABS: 1.0000 | ORAL_TABLET | Freq: Four times a day (QID) | ORAL | Status: AC | PRN

## 2012-01-30 MED ORDER — IOHEXOL 350 MG/ML SOLN
100.0000 mL | Freq: Once | INTRAVENOUS | Status: AC | PRN
  Administered 2012-01-30: 100 mL via INTRAVENOUS

## 2012-01-30 NOTE — ED Notes (Signed)
Called [facility] --paged to [phone]

## 2012-01-30 NOTE — ED Notes (Signed)
Pt c/o SOB and chest pain x 3 days off and on

## 2012-01-30 NOTE — ED Provider Notes (Signed)
History     CSN: [id number]  Arrival date & time 01/30/12  1510   First MD Initiated Contact with Patient 01/30/12 1520      Chief Complaint  Patient presents with  . Chest Pain    (Consider location/radiation/quality/duration/timing/severity/associated sxs/prior treatment) HPI Comments: Pt states that she has been taking zantac and prilosec without relief  Patient is a 46 y.o. female presenting with chest pain. The history is provided by the patient. No language interpreter was used.  Chest Pain The chest pain began 3 - 5 days ago. Chest pain occurs intermittently. The pain is associated with breathing. The severity of the pain is severe. The quality of the pain is described as pleuritic. The pain radiates to the upper back. Chest pain is worsened by certain positions and deep breathing. Pertinent negatives for primary symptoms include no fever, no cough, no palpitations, no abdominal pain, no nausea, no vomiting and no dizziness.  Pertinent negatives for associated symptoms include no diaphoresis, no lower extremity edema, no near-syncope and no weakness. She tried NSAIDs for the symptoms.     Past Medical History  Diagnosis Date  . T2DM (type 2 diabetes mellitus)     Past Surgical History  Procedure Date  . Tubal ligation     Family History  Problem Relation Age of Onset  . Diabetes Father   . Diabetes Sister   . Diabetes Brother   . Breast cancer      History  Substance Use Topics  . Smoking status: Never Smoker   . Smokeless tobacco: Not on file  . Alcohol Use: No    OB History    Grav Para Term Preterm Abortions TAB SAB Ect Mult Living                  Review of Systems  Constitutional: Negative for fever and diaphoresis.  Respiratory: Negative for cough.   Cardiovascular: Positive for chest pain. Negative for palpitations and near-syncope.  Gastrointestinal: Negative for nausea, vomiting and abdominal pain.  Neurological: Negative for dizziness and  weakness.    Allergies  Review of patient's allergies indicates no known allergies.  Home Medications   Current Outpatient Rx  Name Route Sig Dispense Refill  . VITAMIN D3 1000 UNITS PO CAPS Oral Take 1,000 mg by mouth daily.      Marland Kitchen FERROUS GLUCONATE 325 MG PO TABS Oral Take 325 mg by mouth daily with breakfast.      . GLIPIZIDE 5 MG PO TABS  TAKE ONE TABLET BY MOUTH TWICE DAILY BEFORE MEALS 60 tablet 3  . LISINOPRIL 5 MG PO TABS  TAKE ONE TABLET BY MOUTH EVERY DAY 30 tablet 3  . METFORMIN HCL 1000 MG PO TABS  TAKE ONE TABLET BY MOUTH TWICE DAILY WITH FOOD 60 tablet 3    BP 123/67  Pulse 100  Temp 98.2 F (36.8 C) (Oral)  Resp 16  Ht 5\' 3"  (1.6 m)  Wt 190 lb (86.183 kg)  BMI 33.66 kg/m2  SpO2 100%  LMP 12/22/2011  Physical Exam  Nursing note and vitals reviewed. Constitutional: She is oriented to person, place, and time. She appears well-developed and well-nourished.  HENT:  Head: Normocephalic and atraumatic.  Eyes: Conjunctivae and EOM are normal. Pupils are equal, round, and reactive to light.  Neck: Neck supple.  Cardiovascular: Normal rate and regular rhythm.   Pulmonary/Chest: Effort normal and breath sounds normal.       Pt tender to palpation in the substernal chest  and upper back  Abdominal: Soft. Bowel sounds are normal. There is no tenderness.  Musculoskeletal: Normal range of motion.  Neurological: She is alert and oriented to person, place, and time.  Skin: Skin is warm and dry.  Psychiatric: She has a normal mood and affect.    ED Course  Procedures (including critical care time)  Labs Reviewed  CBC - Abnormal; Notable for the following:    MCHC 36.7 (*)     All other components within normal limits  BASIC METABOLIC PANEL - Abnormal; Notable for the following:    Glucose, Bld 178 (*)     All other components within normal limits  TROPONIN I  DIFFERENTIAL   Dg Chest 2 View  01/30/2012  *RADIOLOGY REPORT*  Clinical Data: Mid chest pain, shortness  of breath, diabetes  CHEST - 2 VIEW  Comparison: 11/29/2010  Findings: Normal heart size, mediastinal contours, and pulmonary vascularity. Lungs clear. No pleural effusion or pneumothorax. Multilevel endplate spur formation thoracic spine.  IMPRESSION: No acute abnormalities.  Original Report Authenticated By: Lollie Marrow, M.D.   Ct Angio Chest W/cm &/or Wo Cm  01/30/2012  *RADIOLOGY REPORT*  Clinical Data: Back/chest pain, shortness of breath  CT ANGIOGRAPHY CHEST  Technique:  Multidetector CT imaging of the chest using the standard protocol during bolus administration of intravenous contrast. Multiplanar reconstructed images including MIPs were obtained and reviewed to evaluate the vascular anatomy.  Contrast: OMNIPAQUE IOHEXOL 350 MG/ML SOLN  Comparison: Chest radiographs dated 01/30/2012  Findings: No evidence of pulmonary embolism.  No evidence of thoracic aortic aneurysm or dissection.  The lungs are clear.  No suspicious pulmonary nodules.  No pleural effusion or pneumothorax.  Visualized thyroid is unremarkable.  The heart is normal size.  No pericardial effusion.  Mild atherosclerotic calcifications of the aortic arch.  No suspicious mediastinal, hilar, or axillary lymphadenopathy.  The visualized upper abdomen is unremarkable.  Degenerative changes of the visualized thoracolumbar spine.  IMPRESSION: No evidence of pulmonary embolism.  No evidence of acute cardiopulmonary disease.  Original Report Authenticated By: Charline Bills, M.D.    Date: 01/30/2012  Rate: 101  Rhythm: sinus tachycardia  QRS Axis: normal  Intervals: normal  ST/T Wave abnormalities: normal  Conduction Disutrbances:none  Narrative Interpretation:   Old EKG Reviewed: none available    1. Chest pain       MDM  Pt is having pleuritic cp in nature:pt ct is negative:cardiac enzyme negative:pt has had pain for 3 days and is pain free a this time:spoke with fp and pt will be seen in 2 days for out pt workup  because of diabetes        Teressa Lower, NP 01/30/12 1725  Teressa Lower, NP 01/30/12 1726

## 2012-01-31 NOTE — ED Provider Notes (Signed)
Medical screening examination/treatment/procedure(s) were performed by non-physician practitioner and as supervising physician I was immediately available for consultation/collaboration.    Mikala Podoll L Gerron Guidotti, MD 01/31/12 1542 

## 2012-02-02 ENCOUNTER — Ambulatory Visit: Payer: Self-pay

## 2012-02-04 ENCOUNTER — Other Ambulatory Visit: Payer: Self-pay | Admitting: *Deleted

## 2012-02-05 NOTE — Telephone Encounter (Signed)
Patient is calling because she needs new Rx's for Glipizide and Lisinopril sent to [facility] on [address].  She is completely out of her meds.  She sent the request the day before yesterday, and they told her she needed an new Rx.

## 2012-02-06 MED ORDER — GLIPIZIDE 5 MG PO TABS: 5.0000 mg | ORAL_TABLET | Freq: Two times a day (BID) | ORAL | Status: DC

## 2012-02-06 MED ORDER — LISINOPRIL 5 MG PO TABS: 5.0000 mg | ORAL_TABLET | Freq: Every day | ORAL | Status: DC

## 2012-02-06 NOTE — Telephone Encounter (Signed)
Refilled for one month, needs appt. Prior to additional refills.

## 2012-02-27 ENCOUNTER — Other Ambulatory Visit: Payer: Self-pay | Admitting: *Deleted

## 2012-02-27 MED ORDER — GLIPIZIDE 5 MG PO TABS: 5.0000 mg | ORAL_TABLET | Freq: Two times a day (BID) | ORAL | Status: DC

## 2012-04-06 ENCOUNTER — Encounter: Payer: Self-pay | Admitting: Family Medicine

## 2012-04-06 ENCOUNTER — Ambulatory Visit (INDEPENDENT_AMBULATORY_CARE_PROVIDER_SITE_OTHER): Payer: Self-pay | Admitting: Family Medicine

## 2012-04-06 VITALS — BP 152/94 | HR 79 | Ht 63.0 in | Wt 192.0 lb

## 2012-04-06 DIAGNOSIS — E119 Type 2 diabetes mellitus without complications: Secondary | ICD-10-CM

## 2012-04-06 MED ORDER — LISINOPRIL 5 MG PO TABS: 5.0000 mg | ORAL_TABLET | Freq: Every day | ORAL | Status: DC

## 2012-04-06 MED ORDER — SITAGLIPTIN PHOS-METFORMIN HCL 50-1000 MG PO TABS: 1.0000 | ORAL_TABLET | Freq: Two times a day (BID) | ORAL | Status: DC

## 2012-04-06 NOTE — Patient Instructions (Addendum)
Thank you for coming in today, it was good to see you I have sent in a new medication called janumet, if this is too expensive you can ask the pharmacist how much the two separately would cost Also be sure to use your glipizide two times per day.  If you feel like your sugar is too low check your sugar. Be sure to check your sugar at least once daily, I will see you back in two weeks to see what your glucose is doing.  Marland KitchenJaneece Agee

## 2012-04-14 ENCOUNTER — Other Ambulatory Visit: Payer: Self-pay | Admitting: Family Medicine

## 2012-04-14 MED ORDER — GLIPIZIDE 10 MG PO TABS: 10.0000 mg | ORAL_TABLET | Freq: Two times a day (BID) | ORAL | Status: DC

## 2012-04-14 MED ORDER — METFORMIN HCL 1000 MG PO TABS: 1000.0000 mg | ORAL_TABLET | Freq: Two times a day (BID) | ORAL | Status: DC

## 2012-04-19 NOTE — Progress Notes (Signed)
  Subjective:    Patient ID: Brandy Lynn, female    DOB: 1966/04/30, 46 y.o.   MRN: [id number]  HPI 1.  DM:  Here for f/u on DM.  States that she is mostly compliant with her medications, although she does miss some doses.  She is using glipizide only once per day.  She is not using lisinopril. She does not monitor glucose at home. She states that she feels like her glucose drops low sometimes,she does not check her glucose when she feels this way.  She exercises sometimes, but typically does not have time.  She would like a referral to an eye doctor.  She denies excess thirst or urination, chest pain, vision changes     Review of Systems Per HPI    Objective:   Physical Exam  Constitutional: No distress.       Obese female, nad   Eyes: Conjunctivae are normal.  Cardiovascular: Normal rate and regular rhythm.   Pulmonary/Chest: Effort normal and breath sounds normal.  Musculoskeletal: She exhibits no edema.  Neurological: She is alert.          Assessment & Plan:

## 2012-04-19 NOTE — Assessment & Plan Note (Signed)
Poorly compliant with treatment regimen.  She is resistant to switching to insulin.  A1c is 9.5 today, I doubt she is having lows with this A1c. I asked her to monitor her glucose especially if she thinks it is low.  I instructed her to increase glipizide to bid and will add [place] as she is resistant to changing over to insulin at this point.

## 2012-04-21 ENCOUNTER — Encounter: Payer: Self-pay | Admitting: Family Medicine

## 2012-04-21 ENCOUNTER — Ambulatory Visit (INDEPENDENT_AMBULATORY_CARE_PROVIDER_SITE_OTHER): Payer: Self-pay | Admitting: Family Medicine

## 2012-04-21 VITALS — BP 134/81 | HR 82 | Ht 63.0 in | Wt 192.4 lb

## 2012-04-21 DIAGNOSIS — E119 Type 2 diabetes mellitus without complications: Secondary | ICD-10-CM

## 2012-04-26 NOTE — Assessment & Plan Note (Signed)
Poorly controlled previously. Glipizide increased and changed diet which has helped improved glucose readings.  She is resistant to starting insulin.  Recommended continued dietary changes and exercise.

## 2012-04-26 NOTE — Progress Notes (Signed)
  Subjective:    Patient ID: Brandy Lynn, female    DOB: 03/17/1966, 46 y.o.   MRN: [id number]  HPI  1. DM:  Here for interim visit to f/u on diabetes.  She has been checking her glucose more regularly.  She was unable to afford [place] but decided she would increase glipizide to 10mg  BID.  Her glucose has been in the 120-140 range fasting, with a couple of outliers >200.  She hs found what foods consistently raise her glucose and she has decreased her intake of rice and breads. She denies increased thirst or urination.  She would like a referral to eye doctor to check for diabetic eye changes.   Review of Systems Per HPI    Objective:   Physical Exam  Constitutional: She appears well-nourished. No distress.  HENT:  Head: Normocephalic and atraumatic.  Cardiovascular: Normal rate and regular rhythm.   Pulmonary/Chest: Effort normal and breath sounds normal.  Musculoskeletal: She exhibits no edema.          Assessment & Plan:

## 2012-06-22 ENCOUNTER — Telehealth: Payer: Self-pay | Admitting: Family Medicine

## 2012-06-22 NOTE — Telephone Encounter (Signed)
Called patient, she is requesting referral to ophthalmologist for diabetic eye exam and has the orange card. I told her she has an appointment in a couple weeks with her PCP and could discuss her referral options at that visit.Antwane Grose, Rodena Medin

## 2012-06-22 NOTE — Telephone Encounter (Signed)
Patient has been waiting to hear from someone regarding a referral to an eye doctor.

## 2012-06-28 ENCOUNTER — Encounter: Payer: Self-pay | Admitting: Home Health Services

## 2012-06-29 ENCOUNTER — Encounter: Payer: Self-pay | Admitting: Home Health Services

## 2012-07-06 ENCOUNTER — Ambulatory Visit (INDEPENDENT_AMBULATORY_CARE_PROVIDER_SITE_OTHER): Payer: Self-pay | Admitting: Family Medicine

## 2012-07-06 VITALS — BP 133/87 | HR 88 | Temp 98.7°F | Ht 63.0 in | Wt 192.0 lb

## 2012-07-06 DIAGNOSIS — R0789 Other chest pain: Secondary | ICD-10-CM

## 2012-07-06 DIAGNOSIS — M549 Dorsalgia, unspecified: Secondary | ICD-10-CM

## 2012-07-06 DIAGNOSIS — J020 Streptococcal pharyngitis: Secondary | ICD-10-CM

## 2012-07-06 MED ORDER — CYCLOBENZAPRINE HCL 10 MG PO TABS: 10.0000 mg | ORAL_TABLET | Freq: Three times a day (TID) | ORAL | Status: DC | PRN

## 2012-07-06 MED ORDER — IBUPROFEN 800 MG PO TABS: 800.0000 mg | ORAL_TABLET | Freq: Three times a day (TID) | ORAL | Status: AC | PRN

## 2012-07-06 NOTE — Patient Instructions (Addendum)
Thank you for coming in today, it was good to see you Return in 10-14 days to recheck the area Reschedule your physical with me.

## 2012-07-07 ENCOUNTER — Other Ambulatory Visit: Payer: Self-pay | Admitting: Family Medicine

## 2012-07-11 DIAGNOSIS — R0789 Other chest pain: Secondary | ICD-10-CM | POA: Insufficient documentation

## 2012-07-11 DIAGNOSIS — M549 Dorsalgia, unspecified: Secondary | ICD-10-CM | POA: Insufficient documentation

## 2012-07-11 NOTE — Progress Notes (Signed)
  Subjective:    Patient ID: Brandy Lynn, female    DOB: 10-03-1965, 46 y.o.   MRN: [id number]  HPI 1. Back pain:  Here with complaint of lower back pain x3 days.  Pain described as sharp pain and worsens with different positions.  Has been using ibuprofen 800mg  for the pain and this has helped but it still feels like it "catches" often.  She denies any radiation of pain down the back of her legs, weakness or bowel of bladder incontinence.   2. Chest pain:  C/o R upper chest pain.  Area she points to is more in her R shoulder area.  She states this also started around 3 days ago.  She denies any overuse or trauma.  States that moving her arm and shoulder makes the pain worse. She has some pain when lying on that side as well.  She denies any cough, fever, shortness of breath, abd. Pain of nausea.       Review of Systems Per HPI    Objective:   Physical Exam  Constitutional: She appears well-developed and well-nourished. No distress.  HENT:  Head: Normocephalic and atraumatic.  Neck: Normal range of motion. Neck supple.  Musculoskeletal:       Pain is recreated with palpation of pectoralis, following to insertion into humerus.  She has negative impingement signs and has some pain with resisted adduction.  Low back with limited ROM 2/2 to pain.  She has negative SLR and FABER test.  She does have some spasm of lower lumbar muscles on the L.          Assessment & Plan:

## 2012-07-11 NOTE — Assessment & Plan Note (Signed)
Seems to be musculoskeletal in origin.  Ibuprofen helping, will continue for now. Also given flexeril.  No red flags in history of exam.  Advised weight loss and return if worsening or fails to improve.

## 2012-07-11 NOTE — Assessment & Plan Note (Signed)
Seems to be of MSK origin.  Certainly does not not sound cardiac in nature.  Pain is reproducible with palpation and testing of muscles.  Can use ibuprofen and use daily stretches and strengthening exercises.  Return if fails to improve or worsening.

## 2012-07-21 ENCOUNTER — Ambulatory Visit: Payer: Self-pay | Admitting: Family Medicine

## 2012-08-06 ENCOUNTER — Other Ambulatory Visit: Payer: Self-pay | Admitting: Family Medicine

## 2012-09-03 ENCOUNTER — Ambulatory Visit (INDEPENDENT_AMBULATORY_CARE_PROVIDER_SITE_OTHER): Payer: Self-pay | Admitting: Family Medicine

## 2012-09-03 ENCOUNTER — Encounter: Payer: Self-pay | Admitting: Family Medicine

## 2012-09-03 VITALS — BP 128/84 | HR 102 | Ht 63.0 in | Wt 186.0 lb

## 2012-09-03 DIAGNOSIS — IMO0001 Reserved for inherently not codable concepts without codable children: Secondary | ICD-10-CM

## 2012-09-03 DIAGNOSIS — R1013 Epigastric pain: Secondary | ICD-10-CM

## 2012-09-03 DIAGNOSIS — E1165 Type 2 diabetes mellitus with hyperglycemia: Secondary | ICD-10-CM

## 2012-09-03 NOTE — Patient Instructions (Addendum)
Your A1c is about the same Continue your current medications and be sure to check your blood sugars  Try Maalox for your Stomach symptoms, be sure to use omeprazole every day. I will see you back in 6-8 weeks to see how your sugars are doing, if still high the next step would be insulin

## 2012-09-05 DIAGNOSIS — R1013 Epigastric pain: Secondary | ICD-10-CM | POA: Insufficient documentation

## 2012-09-05 NOTE — Progress Notes (Signed)
  Subjective:    Patient ID: Brandy Lynn, female    DOB: [birth year], 47 y.o.   MRN: [id number]  HPI  1. CHRONIC DIABETES  Disease Monitoring  Blood Sugar Ranges: Does not check glucose  Polyuria: no   Visual problems: yes, blurring of vision often  Medication Compliance: no, skips frequent doses of glipizide  Medication Side Effects  Hypoglycemia: yes, occasionally into the 70s if she doesn't eat.    Preventitive Health Care  Eye Exam: Due  Diet pattern: Generally poor, high starch and rice diet  Exercise: Minimal  2.  Abdominal pain:  Reports abdominal pain in epigastric area and nausea whn waking in the morning.  She sometimes vomits afeter eating.  She is currently using omeprazole and started using ranitidine as well.  Some relief but only minimal.  She denies any blood in her vomit or dark stools.     Review of Systems Per HPI    Objective:   Physical Exam  Constitutional:       Obese female, nad   HENT:  Head: Normocephalic and atraumatic.  Eyes: No scleral icterus.  Neck: Neck supple.  Cardiovascular: Regular rhythm.   Pulmonary/Chest: Effort normal and breath sounds normal.  Abdominal: Bowel sounds are normal. She exhibits no distension. There is tenderness (Mild epigastric tenderness).  Musculoskeletal: She exhibits no edema.  Neurological: She is alert.          Assessment & Plan:

## 2012-09-05 NOTE — Assessment & Plan Note (Signed)
Not well controlled.  Discussed with her the importance of taking medication regularly and checking sugars.  She is very resistant to starting insulin and can not afford other injectables.  She will start checking glucose and follow up in one month.

## 2012-09-05 NOTE — Assessment & Plan Note (Signed)
Likely related to reflux but could also be gastroparesis from poorly controlled DM. Discussed trial of reglan which she declined.  Advised to continue PPI.  Using H2 blocker with PPI not likely to add any additional benefit.  She may get some relief from maalox to neutralize acid.

## 2012-09-07 NOTE — Addendum Note (Signed)
Addended by: Everrett Coombe on: 09/07/2012 08:45 AM   Modules accepted: Orders

## 2012-09-08 ENCOUNTER — Telehealth: Payer: Self-pay | Admitting: *Deleted

## 2012-09-08 NOTE — Telephone Encounter (Signed)
Called and told patient she will need to renew her orange card before we can send her referral through [facility] for ophthalmology. Patient is to call us once she gets the card renewed.Busick, Rodena Medin

## 2012-09-24 ENCOUNTER — Encounter: Payer: Self-pay | Admitting: Family Medicine

## 2012-09-24 ENCOUNTER — Ambulatory Visit (INDEPENDENT_AMBULATORY_CARE_PROVIDER_SITE_OTHER): Payer: Self-pay | Admitting: Family Medicine

## 2012-09-24 VITALS — BP 141/84 | HR 76 | Ht 63.0 in | Wt 187.0 lb

## 2012-09-24 DIAGNOSIS — M109 Gout, unspecified: Secondary | ICD-10-CM

## 2012-09-24 DIAGNOSIS — M79609 Pain in unspecified limb: Secondary | ICD-10-CM

## 2012-09-24 DIAGNOSIS — M79673 Pain in unspecified foot: Secondary | ICD-10-CM

## 2012-09-24 HISTORY — DX: Gout, unspecified: M10.9

## 2012-09-24 LAB — BASIC METABOLIC PANEL
BUN: 9 mg/dL (ref 6–23)
CO2: 31 mEq/L (ref 19–32)
Chloride: 102 mEq/L (ref 96–112)
Creat: 0.59 mg/dL (ref 0.50–1.10)

## 2012-09-24 LAB — CBC WITH DIFFERENTIAL/PLATELET
Basophils Absolute: 0 10*3/uL (ref 0.0–0.1)
Eosinophils Relative: 2 % (ref 0–5)
HCT: 35.4 % — ABNORMAL LOW (ref 36.0–46.0)
Lymphocytes Relative: 38 % (ref 12–46)
MCHC: 34.2 g/dL (ref 30.0–36.0)
MCV: 82.7 fL (ref 78.0–100.0)
Monocytes Absolute: 0.4 10*3/uL (ref 0.1–1.0)
RDW: 14.7 % (ref 11.5–15.5)
WBC: 5.9 10*3/uL (ref 4.0–10.5)

## 2012-09-24 MED ORDER — KETOROLAC TROMETHAMINE 30 MG/ML IJ SOLN
30.0000 mg | Freq: Once | INTRAMUSCULAR | Status: AC
  Administered 2012-09-24: 30 mg via INTRAMUSCULAR

## 2012-09-24 MED ORDER — IBUPROFEN 800 MG PO TABS: 800.0000 mg | ORAL_TABLET | Freq: Three times a day (TID) | ORAL | Status: DC | PRN

## 2012-09-24 NOTE — Patient Instructions (Addendum)
It was a pleasure to meet you today.  I believe your right great toe pain is from gout.  We are giving you an injection of an antiinflammatory medicine called Toradol in the office.   Please fill the prescription for ibuprofen 800mg , but do not begin taking until this evening (same family as the injection you are getting in the office).    Low-protein diets can help reduce flare-ups.   PLEASE CALL BACK for re-evaluation if the pain does not improve or gets worse, if you have fevers, if you cannot bear weight on the right foot, if the pain/redness extends beyond the base of the right great toe, or with other questions or concerns.   Gout Gout is an inflammatory condition (arthritis) caused by a buildup of uric acid crystals in the joints. Uric acid is a chemical that is normally present in the blood. Under some circumstances, uric acid can form into crystals in your joints. This causes joint redness, soreness, and swelling (inflammation). Repeat attacks are common. Over time, uric acid crystals can form into masses (tophi) near a joint, causing disfigurement. Gout is treatable and often preventable. CAUSES  The disease begins with elevated levels of uric acid in the blood. Uric acid is produced by your body when it breaks down a naturally found substance called purines. This also happens when you eat certain foods such as meats and fish. Causes of an elevated uric acid level include:  Being passed down from parent to child (heredity).  Diseases that cause increased uric acid production (obesity, psoriasis, some cancers).  Excessive alcohol use.  Diet, especially diets rich in meat and seafood.  Medicines, including certain cancer-fighting drugs (chemotherapy), diuretics, and aspirin.  Chronic kidney disease. The kidneys are no longer able to remove uric acid well.  Problems with metabolism. Conditions strongly associated with gout include:  Obesity.  High blood pressure.  High  cholesterol.  Diabetes. Not everyone with elevated uric acid levels gets gout. It is not understood why some people get gout and others do not. Surgery, joint injury, and eating too much of certain foods are some of the factors that can lead to gout. SYMPTOMS   An attack of gout comes on quickly. It causes intense pain with redness, swelling, and warmth in a joint.  Fever can occur.  Often, only one joint is involved. Certain joints are more commonly involved:  Base of the big toe.  Knee.  Ankle.  Wrist.  Finger. Without treatment, an attack usually goes away in a few days to weeks. Between attacks, you usually will not have symptoms, which is different from many other forms of arthritis. DIAGNOSIS  Your caregiver will suspect gout based on your symptoms and exam. Removal of fluid from the joint (arthrocentesis) is done to check for uric acid crystals. Your caregiver will give you a medicine that numbs the area (local anesthetic) and use a needle to remove joint fluid for exam. Gout is confirmed when uric acid crystals are seen in joint fluid, using a special microscope. Sometimes, blood, urine, and X-ray tests are also used. TREATMENT  There are 2 phases to gout treatment: treating the sudden onset (acute) attack and preventing attacks (prophylaxis). Treatment of an Acute Attack  Medicines are used. These include anti-inflammatory medicines or steroid medicines.  An injection of steroid medicine into the affected joint is sometimes necessary.  The painful joint is rested. Movement can worsen the arthritis.  You may use warm or cold treatments on painful joints,  depending which works best for you.  Discuss the use of coffee, vitamin C, or cherries with your caregiver. These may be helpful treatment options. Treatment to Prevent Attacks After the acute attack subsides, your caregiver may advise prophylactic medicine. These medicines either help your kidneys eliminate uric acid  from your body or decrease your uric acid production. You may need to stay on these medicines for a very long time. The early phase of treatment with prophylactic medicine can be associated with an increase in acute gout attacks. For this reason, during the first few months of treatment, your caregiver may also advise you to take medicines usually used for acute gout treatment. Be sure you understand your caregiver's directions. You should also discuss dietary treatment with your caregiver. Certain foods such as meats and fish can increase uric acid levels. Other foods such as dairy can decrease levels. Your caregiver can give you a list of foods to avoid. HOME CARE INSTRUCTIONS   Do not take aspirin to relieve pain. This raises uric acid levels.  Only take over-the-counter or prescription medicines for pain, discomfort, or fever as directed by your caregiver.  Rest the joint as much as possible. When in bed, keep sheets and blankets off painful areas.  Keep the affected joint raised (elevated).  Use crutches if the painful joint is in your leg.  Drink enough water and fluids to keep your urine clear or pale yellow. This helps your body get rid of uric acid. Do not drink alcoholic beverages. They slow the passage of uric acid.  Follow your caregiver's dietary instructions. Pay careful attention to the amount of protein you eat. Your daily diet should emphasize fruits, vegetables, whole grains, and fat-free or low-fat milk products.  Maintain a healthy body weight. SEEK MEDICAL CARE IF:   You have an oral temperature above 102 F (38.9 C).  You develop diarrhea, vomiting, or any side effects from medicines.  You do not feel better in 24 hours, or you are getting worse. SEEK IMMEDIATE MEDICAL CARE IF:   Your joint becomes suddenly more tender and you have:  Chills.  An oral temperature above 102 F (38.9 C), not controlled by medicine. MAKE SURE YOU:   Understand these  instructions.  Will watch your condition.  Will get help right away if you are not doing well or get worse. Document Released: 08/01/2000 Document Revised: 10/27/2011 Document Reviewed: 11/12/2009 [facility] Patient Information 2013 [place], [place].

## 2012-09-24 NOTE — Assessment & Plan Note (Signed)
Right great toe MTP joint tenderness and redness, no skin breakdown, exam most consistent with podagra. NSAID and low-protein diet. Uric acid and CBC today.

## 2012-09-24 NOTE — Progress Notes (Signed)
  Subjective:    Patient ID: Brandy Lynn, female    DOB: 12/10/1965, 47 y.o.   MRN: [id number]  HPI  Patient seen for same-day visit for pain acute onset to the R great toe MTP joint with warmth and swelling.  Has been able to bear weight but with pain.  Came on 2 days prior to presentation, not associated with trauma or injury.  No fevers or chills, no nausea or vomiting.  Is taking her prescribed medications.  Reports her diet to be fairly high in protein but does not drink alcohol.  Unknown family history of gout, patient is native of [place].    Reviewed medication list; patient taking as prescribed, including daily ASA treatment.  DM: last A1C 09/03/2012 was 9.8%.  SHe does not check daily CBGs.  On 01/30/12 her BUN/Cr were 8/0.70.  Review of Systems See above.     Objective:   Physical Exam Well appearing, no apparent distress. With flip-flops. FEET: Base of right great toe with tumor/rubor/dolor/calor.  No break in skin.  Able to move albeit with pain. Not affecting other MTP joints.  Sensation in distal toes is grossly unremarkable.  Palpable dp pulses in both feet. R ankle uninvolved, full active ROM against resistance.  Stable ankle joint.  L foot unremarkable, palpable dp pulse and grossly intact sensation, no skin breakdown.  No calf tenderness or redness/swelling.        Assessment & Plan:

## 2012-09-27 ENCOUNTER — Telehealth: Payer: Self-pay | Admitting: Family Medicine

## 2012-09-27 NOTE — Telephone Encounter (Signed)
Called patient back, she reports that her toe is much improved.  Still some mild redness but not painful.  Discussed lab results and the fact that her uric acid level in the normal range does not preclude the possibility of a gouty attack.  Plan for follow up with her primary care physician, Dr Ashley Royalty, in the coming 2 weeks.  Paula Compton, MD

## 2012-09-29 ENCOUNTER — Ambulatory Visit (INDEPENDENT_AMBULATORY_CARE_PROVIDER_SITE_OTHER): Payer: PRIVATE HEALTH INSURANCE | Admitting: Family Medicine

## 2012-09-29 ENCOUNTER — Encounter: Payer: Self-pay | Admitting: Family Medicine

## 2012-09-29 ENCOUNTER — Ambulatory Visit (HOSPITAL_COMMUNITY)
Admission: RE | Admit: 2012-09-29 | Discharge: 2012-09-29 | Disposition: A | Discharge status: Home / Self Care | Payer: PRIVATE HEALTH INSURANCE | Source: Ambulatory Visit | Attending: Family Medicine | Admitting: Family Medicine

## 2012-09-29 VITALS — BP 156/84 | HR 85 | Temp 98.3°F | Ht 63.0 in | Wt 188.5 lb

## 2012-09-29 DIAGNOSIS — M7989 Other specified soft tissue disorders: Secondary | ICD-10-CM | POA: Insufficient documentation

## 2012-09-29 DIAGNOSIS — M109 Gout, unspecified: Secondary | ICD-10-CM

## 2012-09-29 DIAGNOSIS — M7730 Calcaneal spur, unspecified foot: Secondary | ICD-10-CM | POA: Insufficient documentation

## 2012-09-29 DIAGNOSIS — M79609 Pain in unspecified limb: Secondary | ICD-10-CM | POA: Insufficient documentation

## 2012-09-29 MED ORDER — TRAMADOL HCL 50 MG PO TABS: 50.0000 mg | ORAL_TABLET | Freq: Three times a day (TID) | ORAL | Status: DC | PRN

## 2012-09-29 MED ORDER — AMOXICILLIN-POT CLAVULANATE 875-125 MG PO TABS: 1.0000 | ORAL_TABLET | Freq: Two times a day (BID) | ORAL | Status: DC

## 2012-09-29 NOTE — Assessment & Plan Note (Signed)
Puzzling.  By exam, I would have thought gout.  Uric acid rules out.  Lack of response to NSAIDs suggest not pseudogout.  Given at risk because of DM, will Rx with augmentin as if infection.  Also get x rays.  She should have felt trauma given no documented neuropathy, but feel needed due to continued symptoms and lack of dx.

## 2012-09-29 NOTE — Progress Notes (Signed)
  Subjective:    Patient ID: Brandy Lynn, female    DOB: 03-16-1966, 47 y.o.   MRN: [id number]  HPI Patient with continued right foot pain.  Slightly worse than when seen last Friday.  No fever.  No trauma.  Patient is diabetic.  States normal skin sensation.  Reviewed labs - Nl WBC and Uric acid less than five so gout very unlikely.    Review of Systems     Objective:   Physical Exam  Swelling/pain right foot worse at 1st mtp joint.  Swelling on dorsum, mild redness, no warmth.  No entrance wound.       Assessment & Plan:

## 2012-09-29 NOTE — Patient Instructions (Addendum)
I am honestly not sure why your foot is hurting. I am going to treat you for infection because as a diabetic, you are prone to foot infections.  The prescription was sent to [facility]  I will get x rays of the foot and call with results. I also sent pain medicine to [facility]

## 2012-10-11 ENCOUNTER — Other Ambulatory Visit: Payer: Self-pay | Admitting: *Deleted

## 2012-10-11 MED ORDER — GLIPIZIDE 10 MG PO TABS: 10.0000 mg | ORAL_TABLET | Freq: Two times a day (BID) | ORAL | Status: DC

## 2012-11-02 ENCOUNTER — Encounter: Payer: Self-pay | Admitting: Home Health Services

## 2012-11-12 ENCOUNTER — Ambulatory Visit (INDEPENDENT_AMBULATORY_CARE_PROVIDER_SITE_OTHER): Payer: Self-pay | Admitting: Family Medicine

## 2012-11-12 ENCOUNTER — Encounter: Payer: Self-pay | Admitting: Family Medicine

## 2012-11-12 VITALS — BP 164/97 | HR 83 | Ht 63.5 in | Wt 188.0 lb

## 2012-11-12 DIAGNOSIS — I10 Essential (primary) hypertension: Secondary | ICD-10-CM | POA: Insufficient documentation

## 2012-11-12 DIAGNOSIS — E1165 Type 2 diabetes mellitus with hyperglycemia: Secondary | ICD-10-CM

## 2012-11-12 DIAGNOSIS — R030 Elevated blood-pressure reading, without diagnosis of hypertension: Secondary | ICD-10-CM

## 2012-11-12 DIAGNOSIS — Z789 Other specified health status: Secondary | ICD-10-CM | POA: Insufficient documentation

## 2012-11-12 HISTORY — DX: Essential (primary) hypertension: I10

## 2012-11-12 MED ORDER — LISINOPRIL 5 MG PO TABS: 5.0000 mg | ORAL_TABLET | Freq: Every day | ORAL | Status: DC

## 2012-11-12 MED ORDER — GLIPIZIDE 10 MG PO TABS: 10.0000 mg | ORAL_TABLET | Freq: Two times a day (BID) | ORAL | Status: DC

## 2012-11-12 MED ORDER — METFORMIN HCL 1000 MG PO TABS: 1000.0000 mg | ORAL_TABLET | Freq: Two times a day (BID) | ORAL | Status: DC

## 2012-11-12 MED ORDER — DOXYCYCLINE HYCLATE 100 MG PO TABS: 100.0000 mg | ORAL_TABLET | Freq: Every day | ORAL | Status: DC

## 2012-11-12 NOTE — Progress Notes (Signed)
  Subjective:    Patient ID: Brandy Lynn, female    DOB: 08/04/1966, 47 y.o.   MRN: [id number]  Diabetes   Here for follow up before traveling to [place] for the next month. She is leaving tomorrow for her trip.    1. CHRONIC DIABETES  Disease Monitoring  Blood Sugar Ranges: Does not check at home  Polyuria: no   Visual problems: yes   Medication Compliance: yes  Medication Side Effects  Hypoglycemia: no   Preventitive Health Care  Eye Exam: Was referred previously but now has no insurance coverage.  Still needs to have this done  Diet pattern: Reports that this has improved, eating less rice and starchy foods  Exercise: minimal   2.  Elevated blood pressure:  BP elevated today, has not been too much of an issue for her in the past.  She is using her 5mg  of lisinopril each night.  She has had increased stress in preparing to travel to [place]  She does not monitor her blood pressure at home.  She denies headache, chest pain, palpitations.      Review of Systems Per HPI    Objective:   Physical Exam  Constitutional:  Obese female, nad   Cardiovascular: Normal rate and regular rhythm.   Pulmonary/Chest: Effort normal and breath sounds normal. No respiratory distress.  Musculoskeletal: She exhibits no edema.  Neurological: She is alert.          Assessment & Plan:

## 2012-11-12 NOTE — Assessment & Plan Note (Signed)
Traveling to [place] tomorrow.  Desire malaria ppx, will start doxycycline 100mg  daily.

## 2012-11-12 NOTE — Assessment & Plan Note (Signed)
DM remains poorly controlled.  She is not monitoring her blood glucose so she unable to tell me when she may be running high.  I explained to her that I think she will need to be started on a long acting insulin to help control her diabetes better.  I will not start this now with her leaving the country tomorrow because I want to follow her fairly closely initially.  I instructed her to make an appointment with our pharmacy clinic.

## 2012-11-12 NOTE — Patient Instructions (Addendum)
  General Instructions: Thank you for coming in today, it was good to see you Your A1c is 9.1  You should check your sugars at least once daily and record them I would like for you to make an appointment with our pharamacy clinic when you return from [place] to discuss starting insulin Also follow up with me when you return so that we can recheck your blood pressure.    Treatment Goals:  Goals (1 Years of Data) as of 11/12/12         As of Today 09/29/12 09/24/12 09/03/12 07/06/12     Blood Pressure    . Blood Pressure < 130/80  164/97 156/84 141/84 128/84 133/87     Result Component    . HEMOGLOBIN A1C < 8.0  9.1   9.8     . LDL CALC < 100            Progress Toward Treatment Goals:  Treatment Goal 11/12/2012  Hemoglobin A1C improved    Self Care Goals & Plans:  Self Care Goal 11/12/2012  Manage my medications take my medicines as prescribed; bring my medications to every visit  Monitor my health keep track of my blood glucose; bring my glucose meter and log to each visit  Eat healthy foods drink diet soda or water instead of juice or soda; eat more vegetables; eat fruit for snacks and desserts  Be physically active take a walk every day; find an activity I enjoy    Home Blood Glucose Monitoring 11/12/2012  Check my blood sugar once a day  When to check my blood sugar before breakfast     Care Management & Community Referrals:  Referral 11/12/2012  Referrals made for care management support pharmacy clinic

## 2012-11-12 NOTE — Assessment & Plan Note (Signed)
BP elevated, if remains elevated at follow up would consider this hypertension and continue to titrate up lisinopril or potentially add HCTZ to her current regimen.

## 2012-12-30 ENCOUNTER — Encounter (HOSPITAL_BASED_OUTPATIENT_CLINIC_OR_DEPARTMENT_OTHER): Payer: Self-pay | Admitting: *Deleted

## 2012-12-30 ENCOUNTER — Inpatient Hospital Stay (HOSPITAL_BASED_OUTPATIENT_CLINIC_OR_DEPARTMENT_OTHER)
Admission: EM | Admit: 2012-12-30 | Discharge: 2012-12-31 | DRG: 195 | Disposition: A | Discharge status: Home / Self Care | Payer: Medicaid Other | Attending: Family Medicine | Admitting: Family Medicine

## 2012-12-30 ENCOUNTER — Emergency Department (HOSPITAL_BASED_OUTPATIENT_CLINIC_OR_DEPARTMENT_OTHER): Payer: Medicaid Other

## 2012-12-30 DIAGNOSIS — IMO0001 Reserved for inherently not codable concepts without codable children: Secondary | ICD-10-CM

## 2012-12-30 DIAGNOSIS — IMO0002 Reserved for concepts with insufficient information to code with codable children: Secondary | ICD-10-CM

## 2012-12-30 DIAGNOSIS — E669 Obesity, unspecified: Secondary | ICD-10-CM | POA: Diagnosis present

## 2012-12-30 DIAGNOSIS — M109 Gout, unspecified: Secondary | ICD-10-CM | POA: Diagnosis present

## 2012-12-30 DIAGNOSIS — Z6830 Body mass index (BMI) 30.0-30.9, adult: Secondary | ICD-10-CM

## 2012-12-30 DIAGNOSIS — Z7982 Long term (current) use of aspirin: Secondary | ICD-10-CM

## 2012-12-30 DIAGNOSIS — E119 Type 2 diabetes mellitus without complications: Secondary | ICD-10-CM | POA: Diagnosis present

## 2012-12-30 DIAGNOSIS — E1165 Type 2 diabetes mellitus with hyperglycemia: Secondary | ICD-10-CM

## 2012-12-30 DIAGNOSIS — J189 Pneumonia, unspecified organism: Principal | ICD-10-CM

## 2012-12-30 DIAGNOSIS — Z789 Other specified health status: Secondary | ICD-10-CM

## 2012-12-30 DIAGNOSIS — Z9119 Patient's noncompliance with other medical treatment and regimen: Secondary | ICD-10-CM

## 2012-12-30 DIAGNOSIS — Z794 Long term (current) use of insulin: Secondary | ICD-10-CM

## 2012-12-30 DIAGNOSIS — I10 Essential (primary) hypertension: Secondary | ICD-10-CM | POA: Diagnosis present

## 2012-12-30 DIAGNOSIS — R059 Cough, unspecified: Secondary | ICD-10-CM

## 2012-12-30 DIAGNOSIS — R030 Elevated blood-pressure reading, without diagnosis of hypertension: Secondary | ICD-10-CM

## 2012-12-30 DIAGNOSIS — Z91199 Patient's noncompliance with other medical treatment and regimen due to unspecified reason: Secondary | ICD-10-CM

## 2012-12-30 DIAGNOSIS — R05 Cough: Secondary | ICD-10-CM

## 2012-12-30 DIAGNOSIS — R0902 Hypoxemia: Secondary | ICD-10-CM

## 2012-12-30 LAB — CBC WITH DIFFERENTIAL/PLATELET
Basophils Absolute: 0 10*3/uL (ref 0.0–0.1)
Eosinophils Absolute: 0.2 10*3/uL (ref 0.0–0.7)
Eosinophils Relative: 3 % (ref 0–5)
MCH: 28.4 pg (ref 26.0–34.0)
MCHC: 34.8 g/dL (ref 30.0–36.0)
MCV: 81.7 fL (ref 78.0–100.0)
Platelets: 212 10*3/uL (ref 150–400)
RDW: 13.2 % (ref 11.5–15.5)

## 2012-12-30 LAB — CBC
HCT: 33.6 % — ABNORMAL LOW (ref 36.0–46.0)
Hemoglobin: 11.6 g/dL — ABNORMAL LOW (ref 12.0–15.0)
MCH: 27.8 pg (ref 26.0–34.0)
MCHC: 34.5 g/dL (ref 30.0–36.0)
RBC: 4.17 MIL/uL (ref 3.87–5.11)

## 2012-12-30 LAB — PREGNANCY, URINE: Preg Test, Ur: NEGATIVE

## 2012-12-30 LAB — GLUCOSE, CAPILLARY
Glucose-Capillary: 182 mg/dL — ABNORMAL HIGH (ref 70–99)
Glucose-Capillary: 145 mg/dL — ABNORMAL HIGH (ref 70–99)

## 2012-12-30 LAB — URINALYSIS, ROUTINE W REFLEX MICROSCOPIC
Bilirubin Urine: NEGATIVE
Hgb urine dipstick: NEGATIVE
Specific Gravity, Urine: 1.017 (ref 1.005–1.030)
Urobilinogen, UA: 0.2 mg/dL (ref 0.0–1.0)
pH: 5 (ref 5.0–8.0)

## 2012-12-30 LAB — BASIC METABOLIC PANEL
BUN: 10 mg/dL (ref 6–23)
Calcium: 9.9 mg/dL (ref 8.4–10.5)
Creatinine, Ser: 0.6 mg/dL (ref 0.50–1.10)
GFR calc non Af Amer: >90 mL/min (ref >90)
Glucose, Bld: 238 mg/dL — ABNORMAL HIGH (ref 70–99)
Sodium: 141 mEq/L (ref 135–145)

## 2012-12-30 LAB — HIV ANTIBODY (ROUTINE TESTING W REFLEX): HIV: NONREACTIVE

## 2012-12-30 LAB — URINE MICROSCOPIC-ADD ON

## 2012-12-30 LAB — PROTIME-INR: INR: 0.92 (ref 0.00–1.49)

## 2012-12-30 LAB — HEMOGLOBIN A1C: Mean Plasma Glucose: 180 mg/dL — ABNORMAL HIGH (ref <117)

## 2012-12-30 LAB — CREATININE, SERUM
Creatinine, Ser: 0.54 mg/dL (ref 0.50–1.10)
GFR calc non Af Amer: >90 mL/min (ref >90)

## 2012-12-30 MED ORDER — LISINOPRIL 5 MG PO TABS
5.0000 mg | ORAL_TABLET | Freq: Every day | ORAL | Status: DC
  Administered 2012-12-30 – 2012-12-31 (×2): 5 mg via ORAL
  Filled 2012-12-30 (×3): qty 1

## 2012-12-30 MED ORDER — DEXTROSE 5 % IV SOLN
2.0000 g | Freq: Once | INTRAVENOUS | Status: AC
  Administered 2012-12-30: 2 g via INTRAVENOUS
  Filled 2012-12-30: qty 2

## 2012-12-30 MED ORDER — GUAIFENESIN-CODEINE 100-10 MG/5ML PO SOLN
5.0000 mL | Freq: Once | ORAL | Status: AC
  Administered 2012-12-30: 5 mL via ORAL
  Filled 2012-12-30: qty 5

## 2012-12-30 MED ORDER — BENZONATATE 100 MG PO CAPS
100.0000 mg | ORAL_CAPSULE | Freq: Once | ORAL | Status: AC
  Administered 2012-12-30: 100 mg via ORAL
  Filled 2012-12-30: qty 1

## 2012-12-30 MED ORDER — ALBUTEROL SULFATE (5 MG/ML) 0.5% IN NEBU
2.5000 mg | INHALATION_SOLUTION | RESPIRATORY_TRACT | Status: DC
  Administered 2012-12-30 (×3): 2.5 mg via RESPIRATORY_TRACT
  Filled 2012-12-30 (×5): qty 0.5

## 2012-12-30 MED ORDER — DEXTROSE 5 % IV SOLN: 1.0000 g | INTRAVENOUS | Status: DC

## 2012-12-30 MED ORDER — ASPIRIN 81 MG PO CHEW
81.0000 mg | CHEWABLE_TABLET | Freq: Every day | ORAL | Status: DC
  Administered 2012-12-30 – 2012-12-31 (×2): 81 mg via ORAL
  Filled 2012-12-30 (×2): qty 1

## 2012-12-30 MED ORDER — IPRATROPIUM BROMIDE 0.02 % IN SOLN
0.5000 mg | RESPIRATORY_TRACT | Status: DC
  Administered 2012-12-30 (×3): 0.5 mg via RESPIRATORY_TRACT
  Filled 2012-12-30 (×5): qty 2.5

## 2012-12-30 MED ORDER — DEXTROSE 5 % IV SOLN
500.0000 mg | INTRAVENOUS | Status: DC
  Filled 2012-12-30: qty 500

## 2012-12-30 MED ORDER — IBUPROFEN 800 MG PO TABS
800.0000 mg | ORAL_TABLET | Freq: Once | ORAL | Status: AC
  Administered 2012-12-30: 800 mg via ORAL
  Filled 2012-12-30: qty 1

## 2012-12-30 MED ORDER — AZITHROMYCIN 500 MG PO TABS
500.0000 mg | ORAL_TABLET | Freq: Every day | ORAL | Status: DC
  Administered 2012-12-30: 500 mg via ORAL
  Filled 2012-12-30 (×2): qty 1

## 2012-12-30 MED ORDER — HEPARIN SODIUM (PORCINE) 5000 UNIT/ML IJ SOLN: 5000.0000 [IU] | Freq: Three times a day (TID) | INTRAMUSCULAR | Status: DC

## 2012-12-30 MED ORDER — INSULIN ASPART 100 UNIT/ML ~~LOC~~ SOLN
0.0000 [IU] | Freq: Three times a day (TID) | SUBCUTANEOUS | Status: DC
  Administered 2012-12-30: 2 [IU] via SUBCUTANEOUS
  Administered 2012-12-30 (×2): 3 [IU] via SUBCUTANEOUS
  Administered 2012-12-31: 5 [IU] via SUBCUTANEOUS
  Administered 2012-12-31: 3 [IU] via SUBCUTANEOUS

## 2012-12-30 MED ORDER — ASPIRIN 81 MG PO TABS: 81.0000 mg | ORAL_TABLET | Freq: Every day | ORAL | Status: DC

## 2012-12-30 MED ORDER — HYDROCODONE-ACETAMINOPHEN 5-325 MG PO TABS
1.0000 | ORAL_TABLET | Freq: Once | ORAL | Status: AC
  Administered 2012-12-30: 1 via ORAL
  Filled 2012-12-30: qty 1

## 2012-12-30 MED ORDER — DEXTROSE 5 % IV SOLN
1.0000 g | INTRAVENOUS | Status: DC
  Administered 2012-12-30: 1 g via INTRAVENOUS
  Filled 2012-12-30 (×2): qty 10

## 2012-12-30 MED ORDER — DEXTROSE 5 % IV SOLN: 500.0000 mg | INTRAVENOUS | Status: DC

## 2012-12-30 MED ORDER — AZITHROMYCIN 250 MG PO TABS
500.0000 mg | ORAL_TABLET | Freq: Once | ORAL | Status: AC
  Administered 2012-12-30: 500 mg via ORAL
  Filled 2012-12-30: qty 2

## 2012-12-30 MED ORDER — HEPARIN SODIUM (PORCINE) 5000 UNIT/ML IJ SOLN
5000.0000 [IU] | Freq: Three times a day (TID) | INTRAMUSCULAR | Status: DC
  Administered 2012-12-30 – 2012-12-31 (×3): 5000 [IU] via SUBCUTANEOUS
  Filled 2012-12-30 (×6): qty 1

## 2012-12-30 MED ORDER — SODIUM CHLORIDE 0.9 % IV SOLN
INTRAVENOUS | Status: DC
  Administered 2012-12-30 – 2012-12-31 (×3): via INTRAVENOUS

## 2012-12-30 MED ORDER — PNEUMOCOCCAL VAC POLYVALENT 25 MCG/0.5ML IJ INJ: 0.5000 mL | INJECTION | INTRAMUSCULAR | Status: DC | PRN

## 2012-12-30 NOTE — ED Notes (Signed)
Pt c/o chest pain and SOB with bloody sputum x 30 mins

## 2012-12-30 NOTE — H&P (Addendum)
Family Medicine Teaching [facility] Admission History and Physical Service Pager: [phone]  Patient name: Brandy Lynn  Medical record number: [id number]  Date of birth: 1965/10/30 Age: 47 y.o.    Primary Care Provider: MATTHEWS,CODY, DO  Chief Complaint: Cough and shortness of breath  History of Present Illness: Brandy Lynn is a 47 y.o.  AAF that re-visited [place] and just returned last week ([place], [place], [place]) . Starting yesterday, she started  Experiencing chest pain with breathing, cough that was initially bloody and now pink tinged. She endorses fatigue and back pain. She denies headache or vision changes, fever, chills, nausea, vomit, diarrhea, or rash. She was prescribed doxycycline for preventive measures against Malaria, however she reports it was too expensive and she did not purchase it. She reports she waited until she arrived at the village and then took a medication that is used to treat malaria, it was a one time dose, and she is uncertain of the medication. Of note her daughter is sick, but it is with "a virus."  Patient Active Problem List   Diagnosis Date Noted  . Elevated blood pressure 11/12/2012  . Foreign travel 11/12/2012  . 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 Epigastric pain 09/05/2012  . Back pain 07/11/2012  . DM (diabetes mellitus), type 2, uncontrolled 12/09/2010  . Obesity 12/09/2010    Past Medical History  Diagnosis Date  . T2DM (type 2 diabetes mellitus)     Past Surgical History  Procedure Laterality Date  . Tubal ligation      History   Social History  . Marital Status: Married    Spouse Name: N/A    Number of Children: N/A  . Years of Education: N/A   Occupational History  . Not on file.   Social History Main Topics  . Smoking status: Never Smoker   . Smokeless tobacco: Not on file  . Alcohol Use: No  . Drug Use: No  . Sexually Active: No   Other Topics Concern  . Not on file   Social History Narrative   Originally  from [place].  Married.  No substance abuse.  Does not exercise regularly. Employed as Producer, television/film/video.    Family History  Problem Relation Age of Onset  . Diabetes Father   . Diabetes Sister   . Diabetes Brother   . Breast cancer      Review Of Systems: Per HPI  Otherwise 12 point review of systems was performed and was unremarkable.  Physical Exam: BP 147/79  Pulse 80  Temp(Src) 98 F (36.7 C) (Oral)  Resp 18  Ht 5\' 7"  (1.702 m)  Wt 196 lb (88.905 kg)  BMI 30.69 kg/m2  SpO2 100%  Exam: General: Appear ill and weak. NAD. Lying in bed.  HEENT: AT.[place]. PERLA. EOMi. Bilateral eyes without icterus or injections, No drainage from eyes or nares. No exudates or erythema of the throat. Mildly dry mucous membranes. Cardiovascular: RRR.1/6 SM. No rubs.  Respiratory: CTAB.  Abdomen: Soft. NTND. BS present. No organomegaly. Extremities: No erythema or edema. Full ROM. 5/5 muscle strength.  Skin: No rashes noted.  Neuro: PERLA. EOMi. CN Grossly intact, no focal deficits.   Labs and Imaging: Dg Chest 2 View  12/30/2012   *RADIOLOGY REPORT*  Clinical Data: Shortness of breath, chest pain  CHEST - 2 VIEW  Comparison: 01/30/2012  Findings: Mild increased perihilar markings. No pleural effusion or pneumothorax. The cardiomediastinal contours are within normal limits. The visualized bones and soft tissues are without significant appreciable abnormality. Multilevel  degenerative changes.  IMPRESSION: Increased perihilar markings may reflect bronchitis or developing infiltrate.   Original Report Authenticated By: Jearld Lesch, M.D.    Results for orders placed during the hospital encounter of 12/30/12 (from the past 24 hour(s))  PREGNANCY, URINE     Status: None   Collection Time    12/30/12 12:40 AM      Result Value Range   Preg Test, Ur NEGATIVE  NEGATIVE  BASIC METABOLIC PANEL     Status: Abnormal   Collection Time    12/30/12  1:20 AM      Result Value Range   Sodium 141  135 - 145  mEq/L   Potassium 4.2  3.5 - 5.1 mEq/L   Chloride 101  96 - 112 mEq/L   CO2 31  19 - 32 mEq/L   Glucose, Bld 238 (*) 70 - 99 mg/dL   BUN 10  6 - 23 mg/dL   Creatinine, Ser 1.61  0.50 - 1.10 mg/dL   Calcium 9.9  8.4 - 09.6 mg/dL   GFR calc non Af Amer >90  >90 mL/min   GFR calc Af Amer >90  >90 mL/min  D-DIMER, QUANTITATIVE     Status: None   Collection Time    12/30/12  1:20 AM      Result Value Range   D-Dimer, Quant <0.27  0.00 - 0.48 ug/mL-FEU  CBC WITH DIFFERENTIAL     Status: Abnormal   Collection Time    12/30/12  1:20 AM      Result Value Range   WBC 6.2  4.0 - 10.5 K/uL   RBC 4.26  3.87 - 5.11 MIL/uL   Hemoglobin 12.1  12.0 - 15.0 g/dL   HCT 04.5 (*) 40.9 - 81.1 %   MCV 81.7  78.0 - 100.0 fL   MCH 28.4  26.0 - 34.0 pg   MCHC 34.8  30.0 - 36.0 g/dL   RDW 91.4  78.2 - 95.6 %   Platelets 212  150 - 400 K/uL   Neutrophils Relative % 47  43 - 77 %   Neutro Abs 2.9  1.7 - 7.7 K/uL   Lymphocytes Relative 43  12 - 46 %   Lymphs Abs 2.6  0.7 - 4.0 K/uL   Monocytes Relative 8  3 - 12 %   Monocytes Absolute 0.5  0.1 - 1.0 K/uL   Eosinophils Relative 3  0 - 5 %   Eosinophils Absolute 0.2  0.0 - 0.7 K/uL   Basophils Relative 0  0 - 1 %   Basophils Absolute 0.0  0.0 - 0.1 K/uL  TROPONIN I     Status: None   Collection Time    12/30/12  1:20 AM      Result Value Range   Troponin I <0.30  <0.30 ng/mL     Assessment and Plan: Brandy Lynn is 47 y.o. AAF that re-visited [place] and just returned last week ([place], [place], [place]) with a one day history of chest pain with breathing, cough that was initially bloody and now pink tinged and fatigue. D-Dimer was 0.27 on admission ruling out PE, which was a concern.   PNA: Treating for CAP with azith/ceftriaxone. CXR suggestive of possible infiltrate. CBC normal, not likely Malaria without WBC elevation or fever. - Will continue to monitor and treat as CAP.  - Currently requiring 2LNC to maintain 02 >90% - Blood culture and  sputum cultures pending.  - Legionella, strep pneumo pending. - HIV  pending - albuterol Q4PRN - trop cycled and negative x1 - EKG: NSR  HTN: Continued home medications, lisinopril DM: History of non-compliant diabetic. Last A1c 9.1 in March. Patient is on metformin and glipizide at home.  - Will SSI-moderate here. Will likely add Lantus prior to discharge  FEN/GI: Carb modified diet Prophylaxis: Hep sq Disposition: Pending clinical improvement and lab results.  Code Status: Full   PGY-3 Addendum/PCP note  I have seen and evaluated the patient along with Dr. Claiborne Billings and agree with above note with the following additions.  Recently returned from trip to her home country of [place].  Also traveled to [place] and [place].  Did not take doxycycline malaria ppx prior to travel because she could not afford.  She did take one dose of some medication upon arriving in [place].  CXR suggestive of developing PNA but reported hemoptysis is concerning.  Will culture sputum, check strep pneumo and legionella.  Malaria smear obtained, will await results no other systemic symptoms however.  D-Dimer negative, so PE very unlikely.  Other issue is her diabetes, she has been poorly controlled and resistant to starting insulin, which I think she needs to be on. Will d/c oral hypoglycemics for now and switch to sliding scale and add on lantus.  I think she should be d/c on lantus. With close f/u with me.    Everrett Coombe, DO.    FMTS Attending Admission Note: Renold Don MD Personal pager:  [phone] FPTS Service Pager:  [phone]  I  have seen and examined this patient, reviewed their chart. I have discussed this patient with the resident. I agree with the resident's findings, assessment and care plan.  Additionally:  As above.  47 yo female born in [place] but who has spent much of her adult life in [place].  Returned to various parts of [place] for past month, returned on Monday.  Began having cough  productive of blood-tinged sputum, fevers, chills, pleuritic chest pain on Wednesday night.  As this persisted, she presented to ED and was admitted with concern for PNA.  Did not take malaria prophylaxis.  History of night sweats for months (prior to trip) but no fluctuating fevers, weight loss.    Exam: Gen:  Female lying in bed.  NAD.  Not toxic appearing. HEENT:  No scleral icterus noted.  No conjunctival injection nor pallor. Neck:  No LAD Heart:  Grade II/VI SEM noted.  Lungs clear Abd:  Soft/nondistended/nontender.  Good bowel sounds throughout all four quadrants.  No masses noted.  Ext:  No edema or redness:  Imp/Plan: 1.  Cough with fever: - Concern is for CAP.   - Treating appropriately - Much less likely malaria as not anemic and without any current fevers. - Viral disease also possibility.     2.  DM2: - Not well controlled. - Agree with current management.   Tobey Grim, MD 12/30/2012 9:33 AM

## 2012-12-30 NOTE — ED Provider Notes (Signed)
History     CSN: [id number]  Arrival date & time 12/30/12  0019   None     Chief Complaint  Patient presents with  . Shortness of Breath  . Chest Pain    HPI Brandy Lynn is a 47 y.o. female with a history only of poorly controlled diabetes mellitus who presents with centralized moderate sharp chest pain, shortness of breath with some frothy bloody sputum this started about 30 minutes ago. Patient is to lay down to go to bed when she started having coughing, she is coughing up some pink frothy material associated with shortness of breath and cough is associated with some sharp chest pain. No pain without coughing. Patient denies any fevers or chills. Patient recently returned from [place], [place] and [place] where she was for one month. Patient says she was taking doxycycline and was compliant with her malaria medications. She says she was bitten by insects while abroad. No nausea vomiting, diarrhea, stiff neck, headache, abdominal pain.  No gross hemoptysis.   Past Medical History  Diagnosis Date  . T2DM (type 2 diabetes mellitus)     Past Surgical History  Procedure Laterality Date  . Tubal ligation      Family History  Problem Relation Age of Onset  . Diabetes Father   . Diabetes Sister   . Diabetes Brother   . Breast cancer      History  Substance Use Topics  . Smoking status: Never Smoker   . Smokeless tobacco: Not on file  . Alcohol Use: No    OB History   Grav Para Term Preterm Abortions TAB SAB Ect Mult Living                  Review of Systems At least 10pt or greater review of systems completed and are negative except where specified in the HPI.  Allergies  Review of patient's allergies indicates no known allergies.  Home Medications   Current Outpatient Rx  Name  Route  Sig  Dispense  Refill  . aspirin 81 MG tablet   Oral   Take 81 mg by mouth daily.         Marland Kitchen doxycycline (VIBRA-TABS) 100 MG tablet   Oral   Take 1 tablet (100 mg total)  by mouth daily.   45 tablet   0   . glipiZIDE (GLUCOTROL) 10 MG tablet   Oral   Take 1 tablet (10 mg total) by mouth 2 (two) times daily before a meal.   60 tablet   3   . lisinopril (PRINIVIL,ZESTRIL) 5 MG tablet   Oral   Take 1 tablet (5 mg total) by mouth daily.   90 tablet   2   . metFORMIN (GLUCOPHAGE) 1000 MG tablet   Oral   Take 1 tablet (1,000 mg total) by mouth 2 (two) times daily with a meal.   180 tablet   3   . Multiple Vitamin (MULTIVITAMIN) tablet   Oral   Take 1 tablet by mouth daily.           BP 164/86  Pulse 93  Resp 16  Ht 5\' 7"  (1.702 m)  Wt 196 lb (88.905 kg)  BMI 30.69 kg/m2  SpO2 91%  Physical Exam  Nursing notes reviewed.  Electronic medical record reviewed. VITAL SIGNS:   Filed Vitals:   12/30/12 0043 12/30/12 0045 12/30/12 0202 12/30/12 0259  BP:    155/84  Pulse:    82  Temp:  98.6  F (37 C)  98 F (36.7 C)  TempSrc:  Oral  Oral  Resp:    18  Height:      Weight:      SpO2: 94%  94% 100%   CONSTITUTIONAL: Awake, oriented, appears non-toxic HENT: Atraumatic, normocephalic, oral mucosa pink and moist, airway patent. Nares patent without drainage. External ears normal. EYES: Conjunctiva clear, EOMI, PERRLA NECK: Trachea midline, non-tender, supple CARDIOVASCULAR: Normal heart rate, Normal rhythm, No murmurs, rubs, gallops PULMONARY/CHEST: Faint coarse breath sounds especially at the bases. Symmetrical breath sounds. Non-tender. ABDOMINAL: Non-distended, obese, soft, non-tender - no rebound or guarding.  BS normal. NEUROLOGIC: Non-focal, moving all four extremities, no gross sensory or motor deficits. EXTREMITIES: No clubbing, cyanosis, or edema SKIN: Warm, Dry, No erythema, No rash  ED Course  Procedures (including critical care time)  Date: 12/30/2012  Rate: 99  Rhythm: normal sinus rhythm  QRS Axis: Left axis deviation  Intervals: normal  ST/T Wave abnormalities: normal  Conduction Disutrbances: none  Narrative  Interpretation: unremarkable -   Labs Reviewed  BASIC METABOLIC PANEL - Abnormal; Notable for the following:    Glucose, Bld 238 (*)    All other components within normal limits  CBC WITH DIFFERENTIAL - Abnormal; Notable for the following:    HCT 34.8 (*)    All other components within normal limits  CULTURE, BLOOD (ROUTINE X 2)  MALARIA SMEAR  D-DIMER, QUANTITATIVE  TROPONIN I  PREGNANCY, URINE   Dg Chest 2 View  12/30/2012   *RADIOLOGY REPORT*  Clinical Data: Shortness of breath, chest pain  CHEST - 2 VIEW  Comparison: 01/30/2012  Findings: Mild increased perihilar markings. No pleural effusion or pneumothorax. The cardiomediastinal contours are within normal limits. The visualized bones and soft tissues are without significant appreciable abnormality. Multilevel degenerative changes.  IMPRESSION: Increased perihilar markings may reflect bronchitis or developing infiltrate.   Original Report Authenticated By: Jearld Lesch, M.D.     1. Community acquired pneumonia   2. Hypoxia   3. Cough   4. Foreign travel       MDM  Brandy Lynn is a 47 y.o. female presenting with cough, and hypoxia-patient desaturated to the high 80s in the emergency department was placed on 2 L nasal cannula.  Patient was abroad for one month, she says she was compliant with malarial medications. Patient's chest x-ray does show some increased perihilar markings on her some fluid in the fissures, combined with patient's history of coughing up bloody frothy sputum, which I have seen, I'm concerned for some noncardiogenic pulmonary edema. Patient has no history of heart failure only of diabetes. I do not think this is an MI, EKG is unremarkable, patient has no history of heart disease, heart is of normal size on chest x-ray.  Would consider community-acquired pneumonia as well as noncardiogenic pulmonary edema secondary to infectious etiology on differential, potentially malaria.  Obtained labs and blood  cultures times one. Patient has been treated with ceftriaxone, azithromycin.    Patient's labs are unremarkable, BMP is unremarkable, d-dimer is negative, troponin is unremarkable. There is no severe hemolysis, there is no leukocytosis.  Discussed with family practice resident Dr. Claiborne Billings for admission, patient to be transferred to [facility] to admission         Jones Skene, MD 12/30/12 [phone]

## 2012-12-30 NOTE — ED Notes (Signed)
Patient transported to X-ray 

## 2012-12-30 NOTE — Progress Notes (Signed)
Pt received from [facility] to 6N11. Physician made aware of patient's arrival. Call bell explained. No questions at this time. Pt stable. Will continue to monitor.

## 2012-12-31 LAB — LEGIONELLA ANTIGEN, URINE: Legionella Antigen, Urine: NEGATIVE

## 2012-12-31 LAB — GLUCOSE, CAPILLARY: Glucose-Capillary: 191 mg/dL — ABNORMAL HIGH (ref 70–99)

## 2012-12-31 LAB — MALARIA SMEAR

## 2012-12-31 LAB — URINE CULTURE

## 2012-12-31 NOTE — Progress Notes (Signed)
FMTS Attending Daily Note:  Renold Don MD  [phone] pager  Family Practice pager:  [phone] I have seen and examined this patient and have reviewed their chart. I have discussed this patient with the resident. I agree with the resident's findings, assessment and care plan.  Additionally:  Patient much improved, no further with O2 requirement, cough almost completely gone.  Eating, drinking, and ambulating well.  Can DC home.  A1C much better today, defer to PCP regarding initiating insulin.  If so, will be cheaper through clinic than hospital.    Tobey Grim, MD 12/31/2012 12:20 PM

## 2012-12-31 NOTE — Progress Notes (Signed)
Patient has been given discharge instructions and educational handout on pneumonia and antibiotic. Follow up appointment is already in place. Patient wasn't given any prescriptions. Patient verbalizes understanding of the discharge instructions.

## 2012-12-31 NOTE — Progress Notes (Signed)
Family Medicine Teaching Service Daily Progress Note Service Page: [phone]  Patient Assessment: Brandy Lynn is 47 y.o. AAF that re-visited [place] and just returned last week ([place], [place], [place]) with a one day history of chest pain with breathing, cough that was initially bloody and now pink tinged and fatigue.  Subjective: Pt reports feeling much better today. Has not been producing sputum. Amenable to possibly going home today.  Objective: Temp:  [98 F (36.7 C)-98.6 F (37 C)] 98 F (36.7 C) (05/16 0520) Pulse Rate:  [74-85] 75 (05/16 0520) Resp:  [16-18] 16 (05/16 0520) BP: (139-144)/(71-87) 139/76 mmHg (05/16 0520) SpO2:  [100 %] 100 % (05/16 0520) Exam: General: NAD Cardiovascular: RRR Respiratory: normal respiratory effort, off oxygen. Lungs clear to auscultation bilaterally but some diminished breath sounds in R base.  Abdomen: soft, nontender to palpation Extremities: LE nontender to palpation Neuro: grossly nonfocal. Speech intact.  I have reviewed the patient's medications, labs, imaging, and diagnostic testing.  Notable results are summarized below.  Medications:  Scheduled Meds: . ipratropium  0.5 mg Nebulization Q4H   And  . albuterol  2.5 mg Nebulization Q4H  . aspirin  81 mg Oral Daily  . azithromycin  500 mg Oral QHS  . cefTRIAXone (ROCEPHIN)  IV  1 g Intravenous Q24H  . heparin  5,000 Units Subcutaneous Q8H  . insulin aspart  0-15 Units Subcutaneous TID WC  . lisinopril  5 mg Oral Daily   Continuous Infusions: . sodium chloride 100 mL/hr at 12/31/12 0616   PRN Meds:.pneumococcal 23 valent vaccine  Labs:  CBC  Recent Labs Lab 12/30/12 0120 12/30/12 0821  WBC 6.2 5.6  HGB 12.1 11.6*  HCT 34.8* 33.6*  PLT 212 188    BMET  Recent Labs Lab 12/30/12 0120 12/30/12 0821  NA 141  --   K 4.2  --   CL 101  --   CO2 31  --   BUN 10  --   CREATININE 0.60 0.54  GLUCOSE 238*  --   CALCIUM 9.9  --     A1c 7.9 Urine strep  pneumo negative Legionella pending HIV negative  Blood cx NGTD Urine cx pending Malaria smear - preliminary negative on thin smear  Imaging/Diagnostic Tests: CXR 5/15: Increased perihilar markings may reflect bronchitis or developing infiltrate.  Assessment/Plan:  Brandy Lynn is 47 y.o. AAF that re-visited [place] and just returned last week ([place], [place], [place]) with a one day history of chest pain with breathing, cough that was initially bloody and now pink tinged and fatigue. D-Dimer was 0.27 on admission ruling out PE, which was a concern.   PNA: Treating for CAP with azith/ceftriaxone. CXR suggestive of possible infiltrate. CBC normal, not likely Malaria without WBC elevation or fever.  - symptomatically improved, now without oxygen requirement, afebrile. Will switch to PO antibiotics (azithro and PO levaquin) - Blood culture NGTD.  - sputum culture ordered, but unclear if it has been collected (likely because pt has not continued to produce sputum) - Legionella still pending - strep pneumo urine negative, Malaria smear preliminarily negative, HIV negative - albuterol Q4PRN - pt has refused since yesterday morning as she says she has been feeling better - trop negative x1  - EKG: NSR   HTN: Continued home medications: lisinopril   DM: History of non-compliant diabetic. Last A1c 9.1 in March, but is 7.9 now. Patient is on metformin and glipizide at home.  - on SSI (moderate scale) here. CBG's have ranged from 145-196 and  pt has gotten 8 units of SSI in last 24 hours - Will discuss with team today whether to start Lantus upon discharge. Will need low dose if we do start it, based on SSI requirements while hospitalized.  FEN/GI: Carb modified diet  Prophylaxis: Hep sq  Disposition: Likely discharge home today. Code Status: Full   Levert Feinstein, MD [facility] Medicine PGY-1 Service Pager [phone]

## 2013-01-02 NOTE — Discharge Summary (Signed)
Family Medicine Teaching [facility] Discharge Summary  Patient name: Brandy Lynn Medical record number: [id number] Date of birth: 11-24-1965 Age: 47 y.o. Gender: female Date of Admission: 12/30/2012  Date of Discharge: 12/31/12 Admitting Physician: Tobey Grim, MD  Primary Care Provider: MATTHEWS,CODY, DO  Indication for Hospitalization: hemoptysis, shortness of breath  Discharge Diagnoses:  1. Community acquired pneumonia 2. Hypertension 3. Diabetes  Brief Hospital Course:  Brandy Lynn is a 47 y.o. female who was admitted to the hospital after presenting with chest pain and hemoptysis, and had an oxygen requirement. Pt had recently traveled to [place] and had not taken Malaria prophylaxis.  # hemoptysis/chest pain: troponin negative x 1. CXR showed potential pneumonia, so patient was given azithromycin and IV ceftriaxone for CAP. CBC was normal so Malaria was felt to be unlikely without leukocytosis, and patient was afebrile. Blood cultures were obtained and showed NGTD at time of discharge. Urine strep pneumo was negative. Malaria smear was negative preliminarily. HIV was negative. D dimer on admission was 0.27, making PE very unlikely. Pt was given albuterol as needed for shortness of breath. On the day of discharge she reported significant symptomatic improvement, had no oxygen requirement, and was thus deemed stable for discharge. She was discharged with a prescription for azithromycin as it was felt that her pneumonia was most likely viral or atypical in etiology.  # hypertension: continued home lisinopril  # diabetes: A1c was checked and was 7.9. Pt was on metformin and glipizide at home, but was given moderate sliding scale insulin here. Of note we did not discharge patient on insulin, given the relative improvement in her A1c (had previously noted to be 9.1 in March 2014). Will defer starting insulin to PCP.  Consultations: none  Procedures: none  Significant  Labs and Imaging:   CBC  Lab  12/30/12 0120  12/30/12 0821   WBC  6.2  5.6   HGB  12.1  11.6*   HCT  34.8*  33.6*   PLT  212  188    BMET  Lab  12/30/12 0120  12/30/12 0821   NA  141  --   K  4.2  --   CL  101  --   CO2  31  --   BUN  10  --   CREATININE  0.60  0.54   GLUCOSE  238*  --   CALCIUM  9.9  --    A1c 7.9  Urine strep pneumo negative  Legionella pending  HIV negative  Blood cx NGTD  Urine cx pending  Malaria smear - preliminary negative on thin smear   CXR 5/15:  Increased perihilar markings may reflect bronchitis or developing infiltrate.  Discharge Medications:    Medication List    TAKE these medications       aspirin 81 MG tablet  Take 81 mg by mouth daily.     glipiZIDE 10 MG tablet  Commonly known as:  GLUCOTROL  Take 1 tablet (10 mg total) by mouth 2 (two) times daily before a meal.     lisinopril 5 MG tablet  Commonly known as:  PRINIVIL,ZESTRIL  Take 1 tablet (5 mg total) by mouth daily.     metFORMIN 1000 MG tablet  Commonly known as:  GLUCOPHAGE  Take 1 tablet (1,000 mg total) by mouth 2 (two) times daily with a meal.     multivitamin tablet  Take 1 tablet by mouth daily.        Issues for Follow  Up:  -ensure continued symptomatic improvement -we deferred starting insulin as outpatient to pt's PCP -if has continued hemoptysis and/or fever recommend testing for TB  Outstanding Results: urine legionella, blood cultures, malaria thick smear      Follow-up Information   Follow up with MATTHEWS,CODY, DO On 01/07/2013. (at 2:30 pm for hospital follow up)    Contact information:   [address] [place] [address] [phone]       Discharge Condition: stable  Discharge Instructions: Please refer to Patient Instructions section of EMR for full details.  Patient was counseled important signs and symptoms that should prompt return to medical care, changes in medications, dietary instructions, activity  restrictions, and follow up appointments.    Levert Feinstein, MD Family Medicine PGY-1

## 2013-01-03 NOTE — Discharge Summary (Signed)
Family Medicine Teaching Service  Discharge Note : Attending Jeff Juelle Dickmann MD Pager [phone] Inpatient Team Pager:  [phone]  I have reviewed this patient and the patient's chart and have discussed discharge planning with the resident at the time of discharge. I agree with the discharge plan as above.    

## 2013-01-05 LAB — CULTURE, BLOOD (ROUTINE X 2)

## 2013-01-07 ENCOUNTER — Inpatient Hospital Stay: Payer: Self-pay | Admitting: Family Medicine

## 2013-01-13 ENCOUNTER — Ambulatory Visit (INDEPENDENT_AMBULATORY_CARE_PROVIDER_SITE_OTHER): Payer: Self-pay | Admitting: Family Medicine

## 2013-01-13 ENCOUNTER — Encounter: Payer: Self-pay | Admitting: Family Medicine

## 2013-01-13 VITALS — BP 114/62 | HR 84 | Ht 63.0 in | Wt 189.0 lb

## 2013-01-13 DIAGNOSIS — IMO0001 Reserved for inherently not codable concepts without codable children: Secondary | ICD-10-CM

## 2013-01-13 DIAGNOSIS — R030 Elevated blood-pressure reading, without diagnosis of hypertension: Secondary | ICD-10-CM

## 2013-01-13 DIAGNOSIS — E1165 Type 2 diabetes mellitus with hyperglycemia: Secondary | ICD-10-CM

## 2013-01-13 DIAGNOSIS — IMO0002 Reserved for concepts with insufficient information to code with codable children: Secondary | ICD-10-CM

## 2013-01-13 DIAGNOSIS — J189 Pneumonia, unspecified organism: Secondary | ICD-10-CM

## 2013-01-13 MED ORDER — GLIPIZIDE 10 MG PO TABS: 10.0000 mg | ORAL_TABLET | Freq: Two times a day (BID) | ORAL | Status: DC

## 2013-01-13 MED ORDER — METFORMIN HCL 1000 MG PO TABS: 1000.0000 mg | ORAL_TABLET | Freq: Two times a day (BID) | ORAL | Status: DC

## 2013-01-13 NOTE — Assessment & Plan Note (Signed)
Completed antibiotics, symptoms resolved.

## 2013-01-13 NOTE — Assessment & Plan Note (Signed)
Recent A1c indicates improved glycemic control. She is compliant with medications but not with monitoring glucose.  Will hold off on starting insulin given interval improvement in A1c.  Discussed that she check her sugars to have at her return visit.  Increase activity .

## 2013-01-13 NOTE — Assessment & Plan Note (Signed)
Well-controlled, continue current medications

## 2013-01-13 NOTE — Patient Instructions (Addendum)
Thank you for coming in today, it was good to see you I am glad you are feeling better Check your blood sugar daily so that we can get an idea of how your sugars are controlled.  Follow up in three months or sooner as needed

## 2013-01-13 NOTE — Progress Notes (Signed)
  Subjective:    Patient ID: Brandy Lynn, female    DOB: [birth year], 47 y.o.   MRN: [id number]  HPI 1. Hospital f/u: Recently admitted for CAP after travel to [place].  Has completed antibiotics and reports she is doing well.  She has not had any further fevers, cough, hemoptysis or shortness of breath.  2. DM:  Compliant with medications but has not been checking glucose.  Has been resistant to starting insulin and most recent A1c in the hospital shows better control.  She denies polyuria or polydipsia.    3. HTN:  BP has been well controlled.  She is compliant with medications.  She does not monitor BP at home. She denies chest pain, shortness of breath, headache, vision changes.    Past Medical History  Diagnosis Date  . T2DM (type 2 diabetes mellitus)   . HTN (hypertension) 11/12/2012  . Obesity 12/09/2010  . 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 NSAID and low-protein diet. Uric acid and CBC today.      Review of Systems Per HPI    Objective:   Physical Exam  Constitutional: No distress.  HENT:  Head: Atraumatic.  Neck: Neck supple.  Cardiovascular: Normal rate and regular rhythm.   Pulmonary/Chest: Effort normal and breath sounds normal. No respiratory distress. She has no wheezes.  Musculoskeletal: She exhibits no edema.  Neurological: She is alert.          Assessment & Plan:

## 2013-05-11 ENCOUNTER — Ambulatory Visit (INDEPENDENT_AMBULATORY_CARE_PROVIDER_SITE_OTHER): Payer: Self-pay | Admitting: Family Medicine

## 2013-05-11 ENCOUNTER — Encounter: Payer: Self-pay | Admitting: Family Medicine

## 2013-05-11 VITALS — BP 141/83 | HR 69 | Temp 98.5°F | Ht 63.0 in | Wt 189.0 lb

## 2013-05-11 DIAGNOSIS — E1165 Type 2 diabetes mellitus with hyperglycemia: Secondary | ICD-10-CM

## 2013-05-11 DIAGNOSIS — R1906 Epigastric swelling, mass or lump: Secondary | ICD-10-CM

## 2013-05-11 DIAGNOSIS — I10 Essential (primary) hypertension: Secondary | ICD-10-CM

## 2013-05-11 LAB — POCT GLYCOSYLATED HEMOGLOBIN (HGB A1C): Hemoglobin A1C: 9.3

## 2013-05-11 NOTE — Patient Instructions (Addendum)
It was great to see you again today!  For diabetes, I want you to come see Dr. Raymondo Band to discuss starting insulin therapy. You can make this appointment on the way out today.  On your way out, schedule an appointment one morning to come back for fasting labs. Do not eat or drink anything other than water the morning of your lab appointment until after your labs are drawn.   I am ordering an ultrasound of your abdomen for the fullness you are experiencing.   See the handout on how to schedule your mammogram. This is an important test to screen for breast cancer.   I recommend you have a flu shot because I feel it will help prevent the flu and other complications.   If you decide to have it please call to schedule a time.     Come back to see me in 2-3 weeks to recheck your blood pressure and follow up on how your abdomen is doing.  Be well, Dr. Pollie Meyer

## 2013-05-11 NOTE — Progress Notes (Signed)
Patient ID: Brandy Lynn, female   DOB: 21-Dec-1965, 47 y.o.   MRN: [id number]  HPI:  Hypertension: Patient denies taking her blood pressure at home. He is currently taking lisinopril for renal protection, however is not on any other antihypertensives.  Diabetes: Patient is currently taking metformin 1000 mg twice a day and glipizide 10 mg twice daily. She has never been on insulin. She denies chest pain, shortness of breath, polyuria, polydipsia, vision changes, headache, or lower extremity swelling. She does not check her blood sugar at home. She is willing to start insulin if it is necessary. She has not had a night exam recently.  Abdominal fullness: Patient reports feeling intermittently as though her abdomen is swollen. It is not necessarily painful but just feels full. She otherwise feels well. She is not able to provide much more history.  ROS: See HPI  PMFSH: History of uncontrolled diabetes  PHYSICAL EXAM: BP 141/83  Pulse 69  Temp(Src) 98.5 F (36.9 C) (Oral)  Ht 5\' 3"  (1.6 m)  Wt 189 lb (85.73 kg)  BMI 33.49 kg/m2 Gen: NAD, pleasant and cooperative Heart: RRR Lungs: CTAB, NWOB Abd: soft, nontender to palpation. There is fullness in the epigastric area but no discrete palpable mass. Neuro: grossly nonfocal, speech intact Ext: diabetic foot exam WNL (2+ DP pulses bilaterally, normal monofilament test)  ASSESSMENT/PLAN:  # Health maintenance:  -Offered flu shot to pt today, she prefers to wait on getting flu shot. -Given handout on how to schedule mammogram -Patient will return for fasting lipids  # Epigastric fullness: Endorse on history, and appreciated on exam. Will start gentle workup with abdominal ultrasound. Precepted with Dr. Lum Babe who agrees with this plan.  See problem based charting for additional assessment/plan.  FOLLOW UP: F/u in pharmacy clinic with Dr. Raymondo Band to discuss starting insulin.  Also followup with me in 2-3 weeks for blood pressure recheck  and to followup on abdominal symptoms  Latrelle Dodrill, MD

## 2013-05-12 ENCOUNTER — Ambulatory Visit (HOSPITAL_COMMUNITY)
Admission: RE | Admit: 2013-05-12 | Discharge: 2013-05-12 | Disposition: A | Discharge status: Home / Self Care | Payer: Self-pay | Source: Ambulatory Visit | Attending: Family Medicine | Admitting: Family Medicine

## 2013-05-12 ENCOUNTER — Telehealth: Payer: Self-pay | Admitting: Family Medicine

## 2013-05-12 ENCOUNTER — Other Ambulatory Visit: Payer: Self-pay | Admitting: Family Medicine

## 2013-05-12 DIAGNOSIS — R1906 Epigastric swelling, mass or lump: Secondary | ICD-10-CM | POA: Insufficient documentation

## 2013-05-12 DIAGNOSIS — K760 Fatty (change of) liver, not elsewhere classified: Secondary | ICD-10-CM

## 2013-05-12 DIAGNOSIS — K7689 Other specified diseases of liver: Secondary | ICD-10-CM | POA: Insufficient documentation

## 2013-05-12 NOTE — Telephone Encounter (Signed)
Called pt to discuss results of ultrasound, which showed no abnormalities except diffuse hepatic steatosis. She denies drinking any alcohol at all. Advised her that we should check some blood work due to this finding. She is already planning on returning for a lipid panel so I told her I would add these orders on. Will order iron studies, hepatitis panel, and LFT's to evaluate for other potential causes of steatosis (hemochromotisis, viral hepatitis, etc), although most likely diagnosis is nonalcoholic fatty liver disease. Pt will come in for lab appointment to have these drawn.  Also advised that she call & schedule appointment with Dr. Raymondo Band to discuss starting insulin therapy. Pt acknowledged this recommendation and will call.  Latrelle Dodrill, MD

## 2013-05-18 ENCOUNTER — Other Ambulatory Visit: Payer: Self-pay

## 2013-05-18 DIAGNOSIS — K760 Fatty (change of) liver, not elsewhere classified: Secondary | ICD-10-CM

## 2013-05-18 DIAGNOSIS — E1165 Type 2 diabetes mellitus with hyperglycemia: Secondary | ICD-10-CM

## 2013-05-18 LAB — COMPREHENSIVE METABOLIC PANEL
Alkaline Phosphatase: 56 U/L (ref 39–117)
BUN: 6 mg/dL (ref 6–23)
Creat: 0.61 mg/dL (ref 0.50–1.10)
Glucose, Bld: 220 mg/dL — ABNORMAL HIGH (ref 70–99)
Sodium: 139 mEq/L (ref 135–145)
Total Bilirubin: 0.4 mg/dL (ref 0.3–1.2)
Total Protein: 6.3 g/dL (ref 6.0–8.3)

## 2013-05-18 LAB — LIPID PANEL
Cholesterol: 136 mg/dL (ref 0–200)
Total CHOL/HDL Ratio: 3.2 Ratio
Triglycerides: 94 mg/dL (ref <150)
VLDL: 19 mg/dL (ref 0–40)

## 2013-05-18 LAB — IRON AND TIBC
Iron: 60 ug/dL (ref 42–145)
TIBC: 358 ug/dL (ref 250–470)
UIBC: 298 ug/dL (ref 125–400)

## 2013-05-18 NOTE — Progress Notes (Signed)
Drew:  CMET, LIPID, acute Hepatitis panel, IRON & IBC, Ferritin   BAJORDAN, MLS

## 2013-05-19 LAB — HEPATITIS PANEL, ACUTE: Hep A IgM: NEGATIVE

## 2013-05-23 NOTE — Assessment & Plan Note (Addendum)
Blood pressure remains elevated, even on recheck. She has typically not run this high in her 2 most recent clinic appointments. Will have her return for a visit in 2-3 weeks to recheck the blood pressure. If it is still elevated at that time we will need to increase lisinopril or add on a second medication.

## 2013-05-23 NOTE — Assessment & Plan Note (Signed)
A1c elevated today. She is maxed out on oral medications. I recommended to her that she start insulin. She is willing to do this, and will schedule an appointment with Dr. Raymondo Band in pharmacy clinic to discuss initiating insulin therapy.  Cardiac: on aspirin, not on statin. Will return for fasting lipids Renal: On lisinopril Eye: Recommended eye exam Foot: Foot exam done today, normal

## 2013-05-31 ENCOUNTER — Telehealth: Payer: Self-pay | Admitting: Family Medicine

## 2013-05-31 NOTE — Telephone Encounter (Signed)
Will fwd. To PCP. .Happy Begeman  

## 2013-05-31 NOTE — Telephone Encounter (Signed)
Patient would like labwork results from 10/1. Please inform patient.

## 2013-06-02 NOTE — Telephone Encounter (Signed)
Lab results were normal other than elevated glucose.

## 2013-06-06 ENCOUNTER — Other Ambulatory Visit: Payer: Self-pay | Admitting: Family Medicine

## 2013-06-08 ENCOUNTER — Telehealth: Payer: Self-pay | Admitting: Family Medicine

## 2013-06-08 NOTE — Telephone Encounter (Signed)
To [facility] red team - please call pt and let her know I have refilled one month's worth of her lisinopril but she needs to schedule an appointment to follow up on her blood pressure, and also so we can discuss her cholesterol.   She also was supposed to make an appointment with Dr. Raymondo Band to discuss starting insulin, which she hasn't yet done. Please ask her to do this as well.  Thanks!   Latrelle Dodrill, MD

## 2013-06-09 NOTE — Telephone Encounter (Signed)
Pt notified and pt is agreeable.   Sheyli Horwitz, Darlyne Russian, CMA

## 2013-06-27 ENCOUNTER — Ambulatory Visit: Payer: Self-pay | Admitting: Family Medicine

## 2013-06-28 ENCOUNTER — Encounter: Payer: Self-pay | Admitting: Family Medicine

## 2013-06-28 ENCOUNTER — Ambulatory Visit (INDEPENDENT_AMBULATORY_CARE_PROVIDER_SITE_OTHER): Payer: Self-pay | Admitting: Family Medicine

## 2013-06-28 VITALS — BP 145/78 | HR 83 | Temp 98.2°F | Ht 63.0 in | Wt 192.3 lb

## 2013-06-28 DIAGNOSIS — H409 Unspecified glaucoma: Secondary | ICD-10-CM

## 2013-06-28 DIAGNOSIS — H40219 Acute angle-closure glaucoma, unspecified eye: Secondary | ICD-10-CM

## 2013-06-28 DIAGNOSIS — H5710 Ocular pain, unspecified eye: Secondary | ICD-10-CM | POA: Insufficient documentation

## 2013-06-28 DIAGNOSIS — H5711 Ocular pain, right eye: Secondary | ICD-10-CM

## 2013-06-28 NOTE — Patient Instructions (Signed)
You have an appointment this afternoon with the Ophthalmologist.

## 2013-06-28 NOTE — Assessment & Plan Note (Signed)
Four day history of right eye pressure/pain with decreased vision. High suspicion for glaucoma especially given history of diabetes and family history. -Referral to ambulatory ophthalmology made (she is to be seen this afternoon).

## 2013-06-28 NOTE — Progress Notes (Signed)
  Subjective:    Patient ID: Brandy Lynn, female    DOB: 04/17/1966, 47 y.o.   MRN: [id number]  HPI 47 y/o female presents for evaluation of right eye pain that she describes as a pressure sensation, has been present for four day, has associated blurred vision of the right eye, no headache, no temple pain, no redness to the eye, no watering of the eye, no pain with eye movement, no foreign body exposure, no associated fevers/chills, no rhinorrhea, no nasal congestion, no ear pressure, no sore throat  Father has a history of Glaucoma   Review of Systems  Constitutional: Negative for fever, chills and fatigue.  HENT: Negative for congestion, postnasal drip, rhinorrhea and sinus pressure.   Eyes: Positive for pain and visual disturbance. Negative for redness.  Respiratory: Negative for cough and shortness of breath.   Cardiovascular: Negative for chest pain.       Objective:   Physical Exam Vitals: reviewed Gen: pleasant AAF, NAD HEENT: normocephalic, bilateral TM's obscurred by cerumen, PERRL, EOMI, no scleral icterus, no drainage from the eyes, fundoscopic examination attempted however unable to fully visualize the fundus due to non-dilated eye exam, MMM, no pharyngeal erythema or exudate       Assessment & Plan:  Please see problem specific assessment and plan.

## 2013-08-01 ENCOUNTER — Encounter: Payer: Self-pay | Admitting: Family Medicine

## 2013-08-01 ENCOUNTER — Telehealth: Payer: Self-pay | Admitting: Family Medicine

## 2013-08-01 ENCOUNTER — Ambulatory Visit (INDEPENDENT_AMBULATORY_CARE_PROVIDER_SITE_OTHER): Payer: Self-pay | Admitting: Family Medicine

## 2013-08-01 VITALS — BP 140/78 | HR 80 | Ht 63.0 in | Wt 193.4 lb

## 2013-08-01 DIAGNOSIS — I10 Essential (primary) hypertension: Secondary | ICD-10-CM

## 2013-08-01 DIAGNOSIS — E785 Hyperlipidemia, unspecified: Secondary | ICD-10-CM

## 2013-08-01 DIAGNOSIS — H5711 Ocular pain, right eye: Secondary | ICD-10-CM

## 2013-08-01 DIAGNOSIS — H5710 Ocular pain, unspecified eye: Secondary | ICD-10-CM

## 2013-08-01 DIAGNOSIS — E1165 Type 2 diabetes mellitus with hyperglycemia: Secondary | ICD-10-CM

## 2013-08-01 LAB — POCT GLYCOSYLATED HEMOGLOBIN (HGB A1C): Hemoglobin A1C: 8.5

## 2013-08-01 MED ORDER — GLIPIZIDE 10 MG PO TABS: 10.0000 mg | ORAL_TABLET | Freq: Two times a day (BID) | ORAL | Status: DC

## 2013-08-01 MED ORDER — METFORMIN HCL 1000 MG PO TABS: 1000.0000 mg | ORAL_TABLET | Freq: Two times a day (BID) | ORAL | Status: DC

## 2013-08-01 MED ORDER — LISINOPRIL 5 MG PO TABS: ORAL_TABLET | ORAL | Status: DC

## 2013-08-01 MED ORDER — ATORVASTATIN CALCIUM 40 MG PO TABS: 40.0000 mg | ORAL_TABLET | Freq: Every day | ORAL | Status: DC

## 2013-08-01 MED ORDER — SIMVASTATIN 20 MG PO TABS: 20.0000 mg | ORAL_TABLET | Freq: Every day | ORAL | Status: DC

## 2013-08-01 NOTE — Patient Instructions (Addendum)
It was great to see you again today!  We did your diabetic eye exam today. I refilled your medicines as well. I sent in a new prescription for a cholesterol medicine for you. Let me know if it is too expensive. Please schedule an appointment with Dr. Raymondo Band to discuss starting insulin. Please schedule a separate visit for a complete physical. Also schedule a separate visit to talk about your knee pain. In the meantime, you can take motrin over the counter. See Elenore Rota to get set up with the orange card.  Be well, Dr. Pollie Meyer

## 2013-08-01 NOTE — Progress Notes (Signed)
Patient ID: Brandy Lynn, female   DOB: [birth year], 47 y.o.   MRN: [id number]  HPI:  Diabetes: Currently taking glipizide 10mg  BID and metformin 1000mg  BID. Does not check her CBG's at home. She is due for a diabetic eye exam. She denies any chest pain or shortness of breath. Has not gotten in to see Dr. Raymondo Band yet to talk about starting insulin.  Hypertension: Currently taking just lisinopril for renal protection at 5mg  daily. She does not check her blood pressure at home. Denies CP, SOB, headache, or swelling.  Hyperlipidemia: she is not currently on any lipid modifying agents. She is willing to start a statin today to reduce her cardiovascular risk.  Hx of eye pain: pt was seen over 1 month ago here at [facility] by Dr. 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 She was scheduled that same day to see an ophthalmologist, but states that she was unable to go to that appointment because they required she pay $125 at the time of the appointment and she did not have the money. She denies any current pain in her eye, pressure, or difficulty seeing.   ROS: See HPI  PMFSH: hx DM  PHYSICAL EXAM: BP 140/78  Pulse 80  Ht 5\' 3"  (1.6 m)  Wt 193 lb 6.4 oz (87.726 kg)  BMI 34.27 kg/m2  LMP 06/18/2013 Gen: NAD HEENT: NCAT, PERRL, no scleral injection or erythema, vision grossly normal Heart: RRR Lungs: CTAB Neuro: grossly nonfocal, speech intact  ASSESSMENT/PLAN:  # Health maintenance:  -Advised pt needs to schedule an appointment for a complete physical - she wanted to discuss need for pap smear etc today but advised this would need to be a separate visit -Offered flu shot to patient today, but pt declined.   See problem based charting for additional assessment/plan.  Note: patient wanted to discuss knee pain as well, which was brought up at the end of her visit. Instructed her to schedule a separate visit to discuss this as we did not have time to address this  problem today. Advised that in the meantime she could try OTC motrin for the pain.  FOLLOW UP: Schedule separate visit for a physical and to address health maintenance. F/u in 3 months for chronic medical problems Also needs to schedule appointment with Dr. Raymondo Band to discuss starting insulin.  [place] J. Pollie Meyer, MD [facility]

## 2013-08-01 NOTE — Telephone Encounter (Signed)
Will change lipitor to simvastatin 20mg  daily. This is not as effective a cholesterol medicine as the lipitor, but should be more expensive. Please notify patient.  Latrelle Dodrill, MD

## 2013-08-01 NOTE — Telephone Encounter (Signed)
Meds prescribed today were too expensive. Needs something cheaper.

## 2013-08-01 NOTE — Telephone Encounter (Signed)
Called pt. Informed. .Brandy Lynn  

## 2013-08-03 DIAGNOSIS — E782 Mixed hyperlipidemia: Secondary | ICD-10-CM | POA: Insufficient documentation

## 2013-08-03 NOTE — Assessment & Plan Note (Signed)
Pain resolved without ever being seen by ophthalmology. No pain or problems with vision now. Will continue to monitor for recurrence in the future. Retinal imaging done today for routine diabetic health maintenance.

## 2013-08-03 NOTE — Assessment & Plan Note (Signed)
Will initiate atorvastatin 40mg  daily based on ASCVD risk. May need to back down to simvastatin if patient is unable to afford lipitor.

## 2013-08-03 NOTE — Assessment & Plan Note (Signed)
BP 140/78 today, so technically is at goal. Will continue to monitor at future visits.

## 2013-08-03 NOTE — Assessment & Plan Note (Signed)
A1c slightly improved today to 8.5 (down from 9.3). Patient has not yet gotten in with Dr. Raymondo Band to discuss starting insulin. I have again encouraged her to schedule this appointment.  Cardiac: starting statin today, on aspirin Renal: on ACE Eye: retinal photography done today in clinic, await read by ophthalmologist Foot: due September 2015

## 2013-08-05 ENCOUNTER — Telehealth: Payer: Self-pay | Admitting: Family Medicine

## 2013-08-05 DIAGNOSIS — H3530 Unspecified macular degeneration: Secondary | ICD-10-CM

## 2013-08-05 NOTE — Telephone Encounter (Signed)
[facility] red team, please call patient to let her know that her recent retinal photography that was done here at the family medicine center did NOT show diabetic retinopathy, but DID show macular degeneration. Because of this, I am referring her to an eye specialist. She should get a call about scheduling this appointment. Thanks! Latrelle Dodrill, MD

## 2013-08-08 NOTE — Telephone Encounter (Signed)
Pt had appt with Dr.Shapiro on 06/28/13, but was not able to afford the visit ($125). She reports, that her husband is in the process of getting [employer]BS. She will make an appt as soon as she has insurance. Fwd. To PCP for info. Lorenda Hatchet, Renato Battles

## 2013-08-23 ENCOUNTER — Ambulatory Visit: Payer: Self-pay | Admitting: Pharmacist

## 2013-08-31 ENCOUNTER — Encounter: Payer: Self-pay | Admitting: Family Medicine

## 2013-09-08 ENCOUNTER — Ambulatory Visit (INDEPENDENT_AMBULATORY_CARE_PROVIDER_SITE_OTHER): Payer: BC Managed Care – PPO | Admitting: Family Medicine

## 2013-09-08 ENCOUNTER — Ambulatory Visit (INDEPENDENT_AMBULATORY_CARE_PROVIDER_SITE_OTHER): Payer: BC Managed Care – PPO | Admitting: Pharmacist

## 2013-09-08 VITALS — BP 135/71 | HR 94 | Temp 97.9°F | Ht 66.0 in | Wt 194.0 lb

## 2013-09-08 VITALS — BP 135/71 | HR 94 | Ht 66.0 in | Wt 194.0 lb

## 2013-09-08 DIAGNOSIS — E1165 Type 2 diabetes mellitus with hyperglycemia: Secondary | ICD-10-CM

## 2013-09-08 DIAGNOSIS — IMO0001 Reserved for inherently not codable concepts without codable children: Secondary | ICD-10-CM

## 2013-09-08 DIAGNOSIS — IMO0002 Reserved for concepts with insufficient information to code with codable children: Secondary | ICD-10-CM

## 2013-09-08 DIAGNOSIS — M549 Dorsalgia, unspecified: Secondary | ICD-10-CM

## 2013-09-08 DIAGNOSIS — M545 Low back pain, unspecified: Secondary | ICD-10-CM

## 2013-09-08 DIAGNOSIS — M62838 Other muscle spasm: Secondary | ICD-10-CM

## 2013-09-08 MED ORDER — GLIPIZIDE 10 MG PO TABS: 10.0000 mg | ORAL_TABLET | Freq: Two times a day (BID) | ORAL | Status: DC

## 2013-09-08 MED ORDER — DICLOFENAC SODIUM 75 MG PO TBEC: 75.0000 mg | DELAYED_RELEASE_TABLET | Freq: Two times a day (BID) | ORAL | Status: DC

## 2013-09-08 MED ORDER — SITAGLIPTIN PHOSPHATE 100 MG PO TABS: 100.0000 mg | ORAL_TABLET | Freq: Every day | ORAL | Status: DC

## 2013-09-08 MED ORDER — METFORMIN HCL ER 750 MG PO TB24: 750.0000 mg | ORAL_TABLET | Freq: Two times a day (BID) | ORAL | Status: DC

## 2013-09-08 MED ORDER — CYCLOBENZAPRINE HCL 5 MG PO TABS: 5.0000 mg | ORAL_TABLET | Freq: Three times a day (TID) | ORAL | Status: DC | PRN

## 2013-09-08 NOTE — Progress Notes (Signed)
Brandy Reddish, MD Phone: [phone]  Subjective:  Chief complaint-noted  Back Pain  Pain rating, quality, Location-mid to lower back in paraspinous muscles, sometimes goes to upper back. 9/10 pain. Stabbing pain  Started, duration-1 week ago. Fell 2 weeks ago and started with some low level pain at that time. Tripped in front of her kitchen over an object.  Worse with (sitting-related to herniated disc compression)-movement Relieved by-some with massage or advil. Also leaning backwards in chair helps.  Laying down. Icy-hot helped some.  Previous Treatment-massage, advil  Previous imaging-Lumbar films 2006-normal after a fall.   ROS-No saddle anesthesia, bladder incontinence, fecal incontinence, weakness in extremity, numbness or tingling in extremity. History negative for  history of cancer, fever, chills, unintentional weight loss, recent bacterial infection, recent IV drug use, HIV, pain worse at night or while supine. No neck stiffness.   Past Medical History-HLD, obesity, HTN, DM   Medications- reviewed and updated Current Outpatient Prescriptions  Medication Sig Dispense Refill  . aspirin 81 MG tablet Take 81 mg by mouth daily.      Marland Kitchen glipiZIDE (GLUCOTROL) 10 MG tablet Take 1 tablet (10 mg total) by mouth 2 (two) times daily before a meal.  60 tablet  3  . lisinopril (PRINIVIL,ZESTRIL) 5 MG tablet TAKE ONE TABLET BY MOUTH ONCE DAILY  30 tablet  3  . metFORMIN (GLUCOPHAGE-XR) 750 MG 24 hr tablet Take 1 tablet (750 mg total) by mouth 2 (two) times daily.  60 tablet  2  . Multiple Vitamin (MULTIVITAMIN) tablet Take 1 tablet by mouth daily.      . simvastatin (ZOCOR) 20 MG tablet Take 1 tablet (20 mg total) by mouth at bedtime.  30 tablet  3  . cyclobenzaprine (FLEXERIL) 5 MG tablet Take 1 tablet (5 mg total) by mouth 3 (three) times daily as needed for muscle spasms.  30 tablet  1  . diclofenac (VOLTAREN) 75 MG EC tablet Take 1 tablet (75 mg total) by mouth 2 (two) times daily. Do  not take ibuprofen with this.  30 tablet  0  . sitaGLIPtin (JANUVIA) 100 MG tablet Take 1 tablet (100 mg total) by mouth daily.  30 tablet  2   No current facility-administered medications for this visit.    Objective: BP 135/71  Pulse 94  Temp(Src) 97.9 F (36.6 C) (Oral)  Ht 5\' 6"  (1.676 m)  Wt 194 lb (87.998 kg)  BMI 31.33 kg/m2 Gen:  moves slowly, uncomfortable  CV: RRR no murmurs rubs or gallops Lungs: CTAB no crackles, wheeze, rhonchi  Back - Normal skin, Spine with normal alignment and no deformity.  No tenderness to vertebral process palpation.  Paraspinous muscles are very tender from lower thoracic spine down to lumbar region. Left mid back in Paraspinous region with large area of spasm.  Range of motion is full at neck and lumbar sacral regions. No pain with IR or ER of hip. Negative straight leg raise.  Neuro: 5/5 strength lower extremities. Intact sensation.   Assessment/Plan:  Back pain Likely MSK in origin. Patient likely with muscle spasm after a fall at home a few weeks ago. Has tried massage and advil with only minimal help. No ref flags on my exam or history today. Will give patient NSAIDs and flexeril and have her follow up if not improved.     No orders of the defined types were placed in this encounter.    Meds ordered this encounter  Medications  . diclofenac (VOLTAREN) 75 MG EC tablet  Sig: Take 1 tablet (75 mg total) by mouth 2 (two) times daily. Do not take ibuprofen with this.    Dispense:  30 tablet    Refill:  0  . cyclobenzaprine (FLEXERIL) 5 MG tablet    Sig: Take 1 tablet (5 mg total) by mouth 3 (three) times daily as needed for muscle spasms.    Dispense:  30 tablet    Refill:  1

## 2013-09-08 NOTE — Assessment & Plan Note (Signed)
Diabetes diagnosed about 7 years ago and is currently uncontrolled.   Patient reports some hypoglycemic events but is able to verbalize appropriate hypoglycemia management plan, and is adherence with her medications.  Patient is not meeting goals for blood glucose control at this time. She denies burning or tingling sensations in her feet or legs.  She will be getting her eyes checked at a specialist once she gets her insurance.  Control is suboptimal due to lack of exercise and diet control.  Patient does not regularly exercise but would like to start with walking about 30 minutes daily.  She reports rice and bread as part of her regular diet, however she does not eat a lot of desserts, and drinks mostly water.  She also discussed substituting with splenda as part of her diet. Patient has NOT tried Janumet in the past for diabetes management, but due to being unable to get medication secondary to cost issues.  With her new insurance, will start Januvia 100 mg once daily and continue on glipizide and metformin.  She did report some GI upset with metformin, therefore will switch to metformin XR and monitor for further GI upset.  Patient has also agreed to try and start checking her blood glucose regularly with her meter.

## 2013-09-08 NOTE — Patient Instructions (Addendum)
Thanks for coming to see Brandy Lynn today!  We would like to you start Januvia 100 mg daily once you get your insurance.  We will also send a prescription of Metformin ER for you to pick up once you finish your supply of the regular metformin.  Please try not to miss your meals/snacks to help avoid having low blood sugars.  Once you get your meter, we would like you to try to check your blood sugars about three times a week.  We would also like you to try to walk more often and daily as possible.  Please come back to see Brandy Lynn in Pharmacy Clinic in about a month.

## 2013-09-08 NOTE — Progress Notes (Signed)
S:    Patient arrives in good spirits for diabetes management.  Patient reports having a history of Diabetes for about 6-7 years, and is currently taking glipizide and metformin.  She has also tried insulin in the past during her pregnancy.  She would like to avoid insulin injections for now.  O:  . Lab Results  Component Value Date   HGBA1C 8.5 08/01/2013     Patient does not check her blood glucose at home.  A/P: Diabetes diagnosed about 7 years ago and is currently uncontrolled.   Patient reports some hypoglycemic events but is able to verbalize appropriate hypoglycemia management plan, and is adherence with her medications.  Patient is not meeting goals for blood glucose control at this time. She denies burning or tingling sensations in her feet or legs.  She will be getting her eyes checked at a specialist once she gets her insurance.  Control is suboptimal due to lack of exercise and diet control.  Patient does not regularly exercise but would like to start with walking about 30 minutes daily.  She reports rice and bread as part of her regular diet, however she does not eat a lot of desserts, and drinks mostly water.  She also discussed substituting with splenda as part of her diet. Patient has NOT tried Janumet in the past for diabetes management, but due to being unable to get medication secondary to cost issues.  With her new insurance, will start Januvia 100 mg once daily and continue on glipizide and metformin.  She did report some GI upset with metformin, therefore will switch to metformin XR and monitor for further GI upset.  Patient has also agreed to try and start checking her blood glucose regularly with her meter.  Written patient instructions provided.  Follow up in Pharmacist Clinic Visit in a month.   Total time in face to face counseling 45 minutes.  Patient seen with Kathi Der, PharmD Candidate and Jeronimo Norma, PhamD - Resident.

## 2013-09-08 NOTE — Patient Instructions (Signed)
I wonder if your fall caused your back to tighten up. Your muscles on the left side of your back are very tight. I want you to try diclofenac for pain (do not use ibuprofen) and you can use flexeril to help relieve your muscles. If you develop peeing or pooping on yourself or leg weakness or fevers, we would want to see you immediately. Otherwise, I would follow up with [place] if not improving in the next 2-3 weeks.   Thanks,  Dr. Yong Channel  I would still see Dr. Ardelia Mems as previously planned.  Health Maintenance Due  Topic Date Due  . Pneumococcal Polysaccharide Vaccine (##1) 02/19/1968  . Foot Exam  02/19/1976  . Influenza Vaccine  03/18/2013

## 2013-09-09 ENCOUNTER — Encounter: Payer: Self-pay | Admitting: Family Medicine

## 2013-09-09 ENCOUNTER — Encounter: Payer: Self-pay | Admitting: Pharmacist

## 2013-09-09 NOTE — Progress Notes (Signed)
Patient ID: Brandy Lynn, female   DOB: 09-08-1965, 48 y.o.   MRN: [id number] Reviewed: Agree with Dr. Graylin Shiver documentation and management.

## 2013-09-09 NOTE — Assessment & Plan Note (Signed)
Likely MSK in origin. Patient likely with muscle spasm after a fall at home a few weeks ago. Has tried massage and advil with only minimal help. No ref flags on my exam or history today. Will give patient NSAIDs and flexeril and have her follow up if not improved.

## 2013-10-04 ENCOUNTER — Ambulatory Visit: Payer: Self-pay | Admitting: Pharmacist

## 2013-10-18 ENCOUNTER — Ambulatory Visit (INDEPENDENT_AMBULATORY_CARE_PROVIDER_SITE_OTHER): Payer: BC Managed Care – PPO | Admitting: Family Medicine

## 2013-10-18 ENCOUNTER — Encounter: Payer: Self-pay | Admitting: Family Medicine

## 2013-10-18 VITALS — BP 140/80 | HR 98 | Temp 99.8°F | Ht 66.0 in | Wt 183.6 lb

## 2013-10-18 DIAGNOSIS — J209 Acute bronchitis, unspecified: Secondary | ICD-10-CM

## 2013-10-18 DIAGNOSIS — J208 Acute bronchitis due to other specified organisms: Secondary | ICD-10-CM

## 2013-10-18 MED ORDER — E-Z SPACER DEVI: Status: DC

## 2013-10-18 MED ORDER — ALBUTEROL SULFATE HFA 108 (90 BASE) MCG/ACT IN AERS
2.0000 | INHALATION_SPRAY | Freq: Four times a day (QID) | RESPIRATORY_TRACT | Status: DC | PRN
Start: 2013-10-18 — End: 2014-04-18

## 2013-10-18 NOTE — Assessment & Plan Note (Signed)
Patient presents with signs/symtpoms consistent with viral bronchitis. -Prescribed albuterol inhaler to diminish respirator irritation -Patient counseled to continue otc cough and cold medications -Return precautions provided

## 2013-10-18 NOTE — Progress Notes (Signed)
   Subjective:    Patient ID: Brandy Lynn, female    DOB: October 03, 1965, 48 y.o.   MRN: [id number]  HPI 48 y/o female presents with cough, fevers, chills, rhinorrhea since 10/14/13. She was seen in an Urgent Care 2 days ago and prescribed Levaquin. Patient is unclear what she was diagnosed with. She has not been able to tolerate the Levaquin due to nausea and "jittery" feeling, she denies sick contacts, cough is mildly productive of sputum, she reports pain under her ribs with coughing only, no nausea, no emesis, no abdominal pain, no diarrhea, she attempted otc cough and cold medications only for the first 24 hours of her illness which provided minimal relief of her symptoms.    Review of Systems  Constitutional: Negative for fever, chills and fatigue.  HENT: Positive for congestion, postnasal drip and rhinorrhea.   Respiratory: Positive for cough.        Objective:   Physical Exam Vitals: reviewed Gen: Pleasant AAF, NAD HEENT: PERRL, EOMI, no scleral icterus, rhinorrhea present, no frontal or maxillary sinus tenderness, MMM, uvula midline, no pharyngeal erythema or exudate noted, neck supple, no cervical adenopathy Cardiac: RRR, S1 and S2 present, no murmurs, no heaves/thrills Resp: CTAB, normal effort, intermittent bronchospastic cough       Assessment & Plan:  Please see problem specific assessment and plan.

## 2013-10-18 NOTE — Patient Instructions (Signed)
Viral Bronchitis - Use albuterol every 4-6 hours as needed, may also take over the counter nasal decongestants, may take tylenol for pain  Acute Bronchitis Bronchitis is inflammation of the airways that extend from the windpipe into the lungs (bronchi). The inflammation often causes mucus to develop. This leads to a cough, which is the most common symptom of bronchitis.  In acute bronchitis, the condition usually develops suddenly and goes away over time, usually in a couple weeks. Smoking, allergies, and asthma can make bronchitis worse. Repeated episodes of bronchitis may cause further lung problems.  CAUSES Acute bronchitis is most often caused by the same virus that causes a cold. The virus can spread from person to person (contagious).  SIGNS AND SYMPTOMS   Cough.   Fever.   Coughing up mucus.   Body aches.   Chest congestion.   Chills.   Shortness of breath.   Sore throat.  DIAGNOSIS  Acute bronchitis is usually diagnosed through a physical exam. Tests, such as chest X-rays, are sometimes done to rule out other conditions.  TREATMENT  Acute bronchitis usually goes away in a couple weeks. Often times, no medical treatment is necessary. Medicines are sometimes given for relief of fever or cough. Antibiotics are usually not needed but may be prescribed in certain situations. In some cases, an inhaler may be recommended to help reduce shortness of breath and control the cough. A cool mist vaporizer may also be used to help thin bronchial secretions and make it easier to clear the chest.  HOME CARE INSTRUCTIONS  Get plenty of rest.   Drink enough fluids to keep your urine clear or pale yellow (unless you have a medical condition that requires fluid restriction). Increasing fluids may help thin your secretions and will prevent dehydration.   Only take over-the-counter or prescription medicines as directed by your health care provider.   Avoid smoking and secondhand  smoke. Exposure to cigarette smoke or irritating chemicals will make bronchitis worse. If you are a smoker, consider using nicotine gum or skin patches to help control withdrawal symptoms. Quitting smoking will help your lungs heal faster.   Reduce the chances of another bout of acute bronchitis by washing your hands frequently, avoiding people with cold symptoms, and trying not to touch your hands to your mouth, nose, or eyes.   Follow up with your health care provider as directed.  SEEK MEDICAL CARE IF: Your symptoms do not improve after 1 week of treatment.  SEEK IMMEDIATE MEDICAL CARE IF:  You develop an increased fever or chills.   You have chest pain.   You have severe shortness of breath.  You have bloody sputum.   You develop dehydration.  You develop fainting.  You develop repeated vomiting.  You develop a severe headache. MAKE SURE YOU:   Understand these instructions.  Will watch your condition.  Will get help right away if you are not doing well or get worse. Document Released: 09/11/2004 Document Revised: 04/06/2013 Document Reviewed: 01/25/2013 [facility] Patient Information 2014 [place].

## 2013-12-01 ENCOUNTER — Telehealth: Payer: Self-pay | Admitting: Family Medicine

## 2013-12-01 ENCOUNTER — Other Ambulatory Visit: Payer: Self-pay | Admitting: Family Medicine

## 2013-12-01 NOTE — Telephone Encounter (Signed)
To [facility] red team - please call pt and let her know I have refilled one month's worth of her simvastatin but she needs to schedule an appointment to follow up on her medical problems. Thanks!  Leeanne Rio, MD

## 2013-12-02 NOTE — Telephone Encounter (Signed)
Spoke with pt and gave message from MD below.  [place]

## 2013-12-26 ENCOUNTER — Other Ambulatory Visit: Payer: Self-pay | Admitting: Family Medicine

## 2014-01-07 ENCOUNTER — Emergency Department (HOSPITAL_BASED_OUTPATIENT_CLINIC_OR_DEPARTMENT_OTHER): Payer: BC Managed Care – PPO

## 2014-01-07 ENCOUNTER — Encounter (HOSPITAL_BASED_OUTPATIENT_CLINIC_OR_DEPARTMENT_OTHER): Payer: Self-pay | Admitting: Emergency Medicine

## 2014-01-07 ENCOUNTER — Inpatient Hospital Stay (HOSPITAL_BASED_OUTPATIENT_CLINIC_OR_DEPARTMENT_OTHER)
Admission: EM | Admit: 2014-01-07 | Discharge: 2014-01-10 | DRG: 194 | Disposition: A | Discharge status: Home / Self Care | Payer: BC Managed Care – PPO | Attending: Internal Medicine | Admitting: Internal Medicine

## 2014-01-07 DIAGNOSIS — M549 Dorsalgia, unspecified: Secondary | ICD-10-CM

## 2014-01-07 DIAGNOSIS — Z23 Encounter for immunization: Secondary | ICD-10-CM

## 2014-01-07 DIAGNOSIS — N390 Urinary tract infection, site not specified: Secondary | ICD-10-CM | POA: Diagnosis present

## 2014-01-07 DIAGNOSIS — Z91013 Allergy to seafood: Secondary | ICD-10-CM

## 2014-01-07 DIAGNOSIS — H5710 Ocular pain, unspecified eye: Secondary | ICD-10-CM

## 2014-01-07 DIAGNOSIS — B951 Streptococcus, group B, as the cause of diseases classified elsewhere: Secondary | ICD-10-CM | POA: Diagnosis present

## 2014-01-07 DIAGNOSIS — E119 Type 2 diabetes mellitus without complications: Secondary | ICD-10-CM | POA: Diagnosis present

## 2014-01-07 DIAGNOSIS — J208 Acute bronchitis due to other specified organisms: Secondary | ICD-10-CM

## 2014-01-07 DIAGNOSIS — Z7982 Long term (current) use of aspirin: Secondary | ICD-10-CM

## 2014-01-07 DIAGNOSIS — E1165 Type 2 diabetes mellitus with hyperglycemia: Secondary | ICD-10-CM

## 2014-01-07 DIAGNOSIS — I10 Essential (primary) hypertension: Secondary | ICD-10-CM

## 2014-01-07 DIAGNOSIS — R0489 Hemorrhage from other sites in respiratory passages: Secondary | ICD-10-CM

## 2014-01-07 DIAGNOSIS — M109 Gout, unspecified: Secondary | ICD-10-CM

## 2014-01-07 DIAGNOSIS — E669 Obesity, unspecified: Secondary | ICD-10-CM

## 2014-01-07 DIAGNOSIS — R042 Hemoptysis: Secondary | ICD-10-CM

## 2014-01-07 DIAGNOSIS — Z833 Family history of diabetes mellitus: Secondary | ICD-10-CM

## 2014-01-07 DIAGNOSIS — R1013 Epigastric pain: Secondary | ICD-10-CM

## 2014-01-07 DIAGNOSIS — J189 Pneumonia, unspecified organism: Principal | ICD-10-CM

## 2014-01-07 DIAGNOSIS — IMO0001 Reserved for inherently not codable concepts without codable children: Secondary | ICD-10-CM | POA: Diagnosis present

## 2014-01-07 DIAGNOSIS — IMO0002 Reserved for concepts with insufficient information to code with codable children: Secondary | ICD-10-CM

## 2014-01-07 DIAGNOSIS — Z6832 Body mass index (BMI) 32.0-32.9, adult: Secondary | ICD-10-CM

## 2014-01-07 DIAGNOSIS — E785 Hyperlipidemia, unspecified: Secondary | ICD-10-CM

## 2014-01-07 HISTORY — DX: Hemoptysis: R04.2

## 2014-01-07 LAB — INFLUENZA PANEL BY PCR (TYPE A & B)
H1N1 flu by pcr: NOT DETECTED
INFLAPCR: NEGATIVE
INFLBPCR: NEGATIVE

## 2014-01-07 LAB — GLUCOSE, CAPILLARY
GLUCOSE-CAPILLARY: 137 mg/dL — AB (ref 70–99)
Glucose-Capillary: 185 mg/dL — ABNORMAL HIGH (ref 70–99)
Glucose-Capillary: 78 mg/dL (ref 70–99)

## 2014-01-07 LAB — CBC WITH DIFFERENTIAL/PLATELET
BASOS PCT: 0 % (ref 0–1)
Basophils Absolute: 0 10*3/uL (ref 0.0–0.1)
EOS ABS: 0.2 10*3/uL (ref 0.0–0.7)
EOS PCT: 3 % (ref 0–5)
HEMATOCRIT: 35.8 % — AB (ref 36.0–46.0)
HEMOGLOBIN: 12.4 g/dL (ref 12.0–15.0)
LYMPHS ABS: 2.3 10*3/uL (ref 0.7–4.0)
Lymphocytes Relative: 40 % (ref 12–46)
MCH: 29.8 pg (ref 26.0–34.0)
MCHC: 34.6 g/dL (ref 30.0–36.0)
MCV: 86.1 fL (ref 78.0–100.0)
MONO ABS: 0.6 10*3/uL (ref 0.1–1.0)
MONOS PCT: 10 % (ref 3–12)
Neutro Abs: 2.7 10*3/uL (ref 1.7–7.7)
Neutrophils Relative %: 47 % (ref 43–77)
Platelets: 243 10*3/uL (ref 150–400)
RBC: 4.16 MIL/uL (ref 3.87–5.11)
RDW: 13.7 % (ref 11.5–15.5)
WBC: 5.8 10*3/uL (ref 4.0–10.5)

## 2014-01-07 LAB — URINE MICROSCOPIC-ADD ON

## 2014-01-07 LAB — BASIC METABOLIC PANEL
BUN: 9 mg/dL (ref 6–23)
CO2: 29 mEq/L (ref 19–32)
CREATININE: 0.7 mg/dL (ref 0.50–1.10)
Calcium: 10.1 mg/dL (ref 8.4–10.5)
Chloride: 102 mEq/L (ref 96–112)
GLUCOSE: 184 mg/dL — AB (ref 70–99)
Potassium: 3.8 mEq/L (ref 3.7–5.3)
Sodium: 144 mEq/L (ref 137–147)

## 2014-01-07 LAB — URINALYSIS, ROUTINE W REFLEX MICROSCOPIC
Bilirubin Urine: NEGATIVE
Glucose, UA: NEGATIVE mg/dL
Ketones, ur: NEGATIVE mg/dL
LEUKOCYTES UA: NEGATIVE
NITRITE: NEGATIVE
PROTEIN: NEGATIVE mg/dL
Specific Gravity, Urine: 1.023 (ref 1.005–1.030)
UROBILINOGEN UA: 0.2 mg/dL (ref 0.0–1.0)
pH: 6 (ref 5.0–8.0)

## 2014-01-07 LAB — HEMOGLOBIN A1C
HEMOGLOBIN A1C: 7.8 % — AB (ref <5.7)
Mean Plasma Glucose: 177 mg/dL — ABNORMAL HIGH (ref <117)

## 2014-01-07 LAB — PRO B NATRIURETIC PEPTIDE: PRO B NATRI PEPTIDE: 84.7 pg/mL (ref 0–125)

## 2014-01-07 LAB — D-DIMER, QUANTITATIVE: D-Dimer, Quant: <0.27 ug/mL-FEU (ref 0.00–0.48)

## 2014-01-07 LAB — HIV ANTIBODY (ROUTINE TESTING W REFLEX): HIV 1&2 Ab, 4th Generation: NONREACTIVE

## 2014-01-07 LAB — TROPONIN I: Troponin I: <0.3 ng/mL (ref <0.30)

## 2014-01-07 LAB — SEDIMENTATION RATE: Sed Rate: 13 mm/hr (ref 0–22)

## 2014-01-07 LAB — STREP PNEUMONIAE URINARY ANTIGEN: STREP PNEUMO URINARY ANTIGEN: NEGATIVE

## 2014-01-07 MED ORDER — ACETAMINOPHEN 325 MG PO TABS
650.0000 mg | ORAL_TABLET | ORAL | Status: DC | PRN
  Administered 2014-01-07 – 2014-01-08 (×2): 650 mg via ORAL
  Filled 2014-01-07 (×2): qty 2

## 2014-01-07 MED ORDER — SIMVASTATIN 20 MG PO TABS
20.0000 mg | ORAL_TABLET | Freq: Every day | ORAL | Status: DC
  Administered 2014-01-07 – 2014-01-09 (×3): 20 mg via ORAL
  Filled 2014-01-07 (×4): qty 1

## 2014-01-07 MED ORDER — DEXTROSE 5 % IV SOLN
1.0000 g | INTRAVENOUS | Status: DC
  Administered 2014-01-07 – 2014-01-09 (×3): 1 g via INTRAVENOUS
  Filled 2014-01-07 (×4): qty 10

## 2014-01-07 MED ORDER — IOHEXOL 350 MG/ML SOLN
100.0000 mL | Freq: Once | INTRAVENOUS | Status: AC | PRN
  Administered 2014-01-07: 100 mL via INTRAVENOUS

## 2014-01-07 MED ORDER — ACETAMINOPHEN 500 MG PO TABS
ORAL_TABLET | ORAL | Status: AC
  Filled 2014-01-07: qty 2

## 2014-01-07 MED ORDER — DEXTROSE 5 % IV SOLN
500.0000 mg | INTRAVENOUS | Status: DC
  Administered 2014-01-07 – 2014-01-10 (×4): 500 mg via INTRAVENOUS
  Filled 2014-01-07 (×4): qty 500

## 2014-01-07 MED ORDER — VITAMIN D3 25 MCG (1000 UNIT) PO TABS
1000.0000 [IU] | ORAL_TABLET | Freq: Every day | ORAL | Status: DC
  Administered 2014-01-07 – 2014-01-10 (×4): 1000 [IU] via ORAL
  Filled 2014-01-07 (×4): qty 1

## 2014-01-07 MED ORDER — ACETAMINOPHEN 500 MG PO TABS
1000.0000 mg | ORAL_TABLET | Freq: Once | ORAL | Status: AC
  Administered 2014-01-07: 1000 mg via ORAL

## 2014-01-07 MED ORDER — LISINOPRIL 5 MG PO TABS
5.0000 mg | ORAL_TABLET | Freq: Every day | ORAL | Status: DC
  Administered 2014-01-07 – 2014-01-09 (×3): 5 mg via ORAL
  Filled 2014-01-07 (×3): qty 1

## 2014-01-07 MED ORDER — ALBUTEROL SULFATE (2.5 MG/3ML) 0.083% IN NEBU: 2.5000 mg | INHALATION_SOLUTION | Freq: Four times a day (QID) | RESPIRATORY_TRACT | Status: DC | PRN

## 2014-01-07 MED ORDER — INSULIN ASPART 100 UNIT/ML ~~LOC~~ SOLN
0.0000 [IU] | Freq: Three times a day (TID) | SUBCUTANEOUS | Status: DC
  Administered 2014-01-07 – 2014-01-08 (×4): 2 [IU] via SUBCUTANEOUS
  Administered 2014-01-09: 3 [IU] via SUBCUTANEOUS
  Administered 2014-01-09 – 2014-01-10 (×3): 2 [IU] via SUBCUTANEOUS

## 2014-01-07 MED ORDER — ONDANSETRON HCL 4 MG/2ML IJ SOLN
4.0000 mg | Freq: Four times a day (QID) | INTRAMUSCULAR | Status: DC | PRN
  Administered 2014-01-07: 4 mg via INTRAVENOUS
  Filled 2014-01-07: qty 2

## 2014-01-07 MED ORDER — ALBUTEROL SULFATE (2.5 MG/3ML) 0.083% IN NEBU: 2.5000 mg | INHALATION_SOLUTION | Freq: Four times a day (QID) | RESPIRATORY_TRACT | Status: DC

## 2014-01-07 MED ORDER — INSULIN ASPART 100 UNIT/ML ~~LOC~~ SOLN
0.0000 [IU] | Freq: Every day | SUBCUTANEOUS | Status: DC
  Administered 2014-01-09: 2 [IU] via SUBCUTANEOUS

## 2014-01-07 MED ORDER — SODIUM CHLORIDE 0.9 % IV SOLN
INTRAVENOUS | Status: DC
  Administered 2014-01-07: 11:00:00 via INTRAVENOUS

## 2014-01-07 NOTE — ED Notes (Signed)
Pt states that she began having a cough around 00:30.  She reports that she was coughing up bright red blood and that she also had some sob with this.  Pt denies being sick recently, no travel outside the country, no night sweats or recent weight loss

## 2014-01-07 NOTE — ED Provider Notes (Signed)
CSN: [id number]     Arrival date & time 01/07/14  0126 History   First MD Initiated Contact with Patient 01/07/14 0201     Chief Complaint  Patient presents with  . Hemoptysis     (Consider location/radiation/quality/duration/timing/severity/associated sxs/prior Treatment) Patient is a 48 y.o. female presenting with cough. The history is provided by the patient.  Cough Cough characteristics:  Productive Sputum characteristics:  Pink Severity:  Moderate Onset quality:  Sudden Timing:  Intermittent Progression:  Unchanged Chronicity:  Recurrent Smoker: no   Context: not fumes   Relieved by:  Nothing Worsened by:  Nothing tried Ineffective treatments:  None tried Associated symptoms: wheezing   Associated symptoms: no chest pain and no fever   Risk factors: no recent travel     Past Medical History  Diagnosis Date  . T2DM (type 2 diabetes mellitus)   . HTN (hypertension) 11/12/2012  . Obesity 12/09/2010  . 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 NSAID and low-protein diet. Uric acid and CBC today.     Past Surgical History  Procedure Laterality Date  . Tubal ligation     Family History  Problem Relation Age of Onset  . Diabetes Father   . Diabetes Sister   . Diabetes Brother   . Breast cancer     History  Substance Use Topics  . Smoking status: Never Smoker   . Smokeless tobacco: Not on file  . Alcohol Use: No   OB History   Grav Para Term Preterm Abortions TAB SAB Ect Mult Living                 Review of Systems  Constitutional: Negative for fever.  Respiratory: Positive for cough and wheezing.   Cardiovascular: Negative for chest pain, palpitations and leg swelling.  All other systems reviewed and are negative.     Allergies  Fish allergy  Home Medications   Prior to Admission medications   Medication Sig Start Date End Date Taking? Authorizing Provider   albuterol (PROVENTIL HFA;VENTOLIN HFA) 108 (90 BASE) MCG/ACT inhaler Inhale 2 puffs into the lungs every 6 (six) hours as needed for wheezing or shortness of breath. 10/18/13   Lupita Dawn, MD  aspirin 81 MG tablet Take 81 mg by mouth daily.    Historical Provider, MD  cholecalciferol (VITAMIN D) 1000 UNITS tablet Take 1,000 Units by mouth daily.    Historical Provider, MD  glipiZIDE (GLUCOTROL) 10 MG tablet Take 1 tablet (10 mg total) by mouth 2 (two) times daily before a meal. 09/08/13   Zigmund Gottron, MD  lisinopril (PRINIVIL,ZESTRIL) 5 MG tablet TAKE ONE TABLET BY MOUTH ONCE DAILY 12/26/13   Leeanne Rio, MD  metFORMIN (GLUCOPHAGE-XR) 750 MG 24 hr tablet Take 1 tablet (750 mg total) by mouth 2 (two) times daily. 09/08/13   Zigmund Gottron, MD  simvastatin (ZOCOR) 20 MG tablet TAKE ONE TABLET BY MOUTH AT BEDTIME 12/01/13   Leeanne Rio, MD  sitaGLIPtin (JANUVIA) 100 MG tablet Take 1 tablet (100 mg total) by mouth daily. 09/08/13   Zigmund Gottron, MD  Spacer/Aero-Holding Chambers (E-Z SPACER) inhaler Use as instructed 10/18/13   Lupita Dawn, MD   BP 169/86  Pulse 99  Temp(Src) 98.2 F (36.8 C) (Oral)  Resp 25  Wt 192 lb (87.091 kg)  SpO2 96%  LMP 12/30/2013 Physical Exam  Constitutional: She is oriented to person,  place, and time. She appears well-developed and well-nourished. No distress.  HENT:  Head: Normocephalic and atraumatic.  Mouth/Throat: Oropharynx is clear and moist.  Eyes: Conjunctivae are normal. Pupils are equal, round, and reactive to light.  Neck: Normal range of motion. Neck supple.  Cardiovascular: Normal rate, regular rhythm and intact distal pulses.   Pulmonary/Chest: She has wheezes. She has rales.  Abdominal: Soft. Bowel sounds are normal. There is no tenderness. There is no rebound and no guarding.  Musculoskeletal: Normal range of motion. She exhibits no edema and no tenderness.  Neurological: She is alert and oriented to person,  place, and time.  Skin: Skin is warm and dry.  Psychiatric: She has a normal mood and affect.    ED Course  Procedures (including critical care time) Labs Review Labs Reviewed  CBC WITH DIFFERENTIAL - Abnormal; Notable for the following:    HCT 35.8 (*)    All other components within normal limits  D-DIMER, QUANTITATIVE  TROPONIN I  BASIC METABOLIC PANEL    Imaging Review Dg Chest 2 View  01/07/2014   CLINICAL DATA:  Hemoptysis, cough, and shortness of breath.  EXAM: CHEST  2 VIEW  COMPARISON:  12/30/2012  FINDINGS: There small vague areas of increased density in both upper lobes, nonspecific. Heart size and vascularity are normal. No effusions. No acute osseous abnormality.  IMPRESSION: Subtle areas of increased density in both upper lobes which could represent small areas of infiltrate.   Electronically Signed   By: Rozetta Nunnery M.D.   On: 01/07/2014 02:25     EKG Interpretation   Date/Time:  Saturday Jan 07 2014 02:45:42 EDT Ventricular Rate:  89 PR Interval:  146 QRS Duration: 74 QT Interval:  360 QTC Calculation: 438 R Axis:   -22 Text Interpretation:  Normal sinus rhythm Confirmed by [facility]  MD,  Ein Rijo ([id number]) on 01/07/2014 2:57:04 AM      MDM   Final diagnoses:  None   Results for orders placed during the hospital encounter of 01/07/14  CBC WITH DIFFERENTIAL      Result Value Ref Range   WBC 5.8  4.0 - 10.5 K/uL   RBC 4.16  3.87 - 5.11 MIL/uL   Hemoglobin 12.4  12.0 - 15.0 g/dL   HCT 35.8 (*) 36.0 - 46.0 %   MCV 86.1  78.0 - 100.0 fL   MCH 29.8  26.0 - 34.0 pg   MCHC 34.6  30.0 - 36.0 g/dL   RDW 13.7  11.5 - 15.5 %   Platelets 243  150 - 400 K/uL   Neutrophils Relative % 47  43 - 77 %   Neutro Abs 2.7  1.7 - 7.7 K/uL   Lymphocytes Relative 40  12 - 46 %   Lymphs Abs 2.3  0.7 - 4.0 K/uL   Monocytes Relative 10  3 - 12 %   Monocytes Absolute 0.6  0.1 - 1.0 K/uL   Eosinophils Relative 3  0 - 5 %   Eosinophils Absolute 0.2  0.0 - 0.7 K/uL    Basophils Relative 0  0 - 1 %   Basophils Absolute 0.0  0.0 - 0.1 K/uL  BASIC METABOLIC PANEL      Result Value Ref Range   Sodium 144  137 - 147 mEq/L   Potassium 3.8  3.7 - 5.3 mEq/L   Chloride 102  96 - 112 mEq/L   CO2 29  19 - 32 mEq/L   Glucose, Bld 184 (*) 70 -  99 mg/dL   BUN 9  6 - 23 mg/dL   Creatinine, Ser 0.70  0.50 - 1.10 mg/dL   Calcium 10.1  8.4 - 10.5 mg/dL   GFR calc non Af Amer >90  >90 mL/min   GFR calc Af Amer >90  >90 mL/min  D-DIMER, QUANTITATIVE      Result Value Ref Range   D-Dimer, Quant <0.27  0.00 - 0.48 ug/mL-FEU  TROPONIN I      Result Value Ref Range   Troponin I <0.30  <0.30 ng/mL   Dg Chest 2 View  01/07/2014   CLINICAL DATA:  Hemoptysis, cough, and shortness of breath.  EXAM: CHEST  2 VIEW  COMPARISON:  12/30/2012  FINDINGS: There small vague areas of increased density in both upper lobes, nonspecific. Heart size and vascularity are normal. No effusions. No acute osseous abnormality.  IMPRESSION: Subtle areas of increased density in both upper lobes which could represent small areas of infiltrate.   Electronically Signed   By: Rozetta Nunnery M.D.   On: 01/07/2014 02:25   Ct Angio Chest Pe W/cm &/or Wo Cm  01/07/2014   CLINICAL DATA:  Cough.  Hemoptysis.  Shortness of breath.  EXAM: CT ANGIOGRAPHY CHEST WITH CONTRAST  TECHNIQUE: Multidetector CT imaging of the chest was performed using the standard protocol during bolus administration of intravenous contrast. Multiplanar CT image reconstructions and MIPs were obtained to evaluate the vascular anatomy.  CONTRAST:  141mL OMNIPAQUE IOHEXOL 350 MG/ML SOLN  COMPARISON:  Chest radiograph performed earlier today at 2 a.m., and CTA of the chest performed 01/30/2012  FINDINGS: There is no evidence of pulmonary embolus.  Fluffy airspace opacification is noted within both lungs, raising concern for mild diffuse pulmonary alveolar hemorrhage, given the patient's hemoptysis. There is no evidence of pleural effusion or  pneumothorax. No masses are identified; no abnormal focal contrast enhancement is seen.  The mediastinum is unremarkable in appearance. No mediastinal lymphadenopathy is seen. No pericardial effusion is identified. The great vessels are grossly unremarkable in appearance. No axillary lymphadenopathy is seen. The visualized portions of the thyroid gland are unremarkable in appearance.  The visualized portions of the liver and spleen are unremarkable.  No acute osseous abnormalities are seen.  Review of the MIP images confirms the above findings.  IMPRESSION: 1. No evidence of pulmonary embolus. 2. Fluffy bilateral airspace opacification, raising concern for mild diffuse pulmonary alveolar hemorrhage, given the patient's hemoptysis.   Electronically Signed   By: Garald Balding M.D.   On: 01/07/2014 03:47     Will admit to the hospital for further diagnosis and treatment    Tenessa Marsee K Aquarius Tremper-Rasch, MD 01/07/14 2297

## 2014-01-07 NOTE — Consult Note (Signed)
Name: Brandy Lynn MRN: [id number] DOB: [birth year]    ADMISSION DATE:  01/07/2014 CONSULTATION DATE:  01/09/14 REFERRING MD :  Dr Tana Coast PRIMARY SERVICE:  [facility]  CHIEF COMPLAINT:  Coughed blood  BRIEF PATIENT DESCRIPTION: 48 yoF never smoker, no ETOH, from [place] 18 years ago, diabetic. Otherwise in usual good health. Went to bed and woke smothering with bubbly sensation in upper chest, coughed up BRB. No fever or chest pain. CT [facility] neg for PE, showed diffuse fluffy infiltrates. Bleeding has slowed, less dyspnea. PCCM called for opinion. Similar episode May, 2014 resolved over 2 days, regarded as culture neg pneumonia.  SIGNIFICANT EVENTS / STUDIES:  CT chest 5/23  LINES / TUBES: PIV  CULTURES: Influenza antigen 5/23 Neg Urine 5/23> Blood 5/15> Malaria smear 5/15>   ANTIBIOTICS: Roceph 5/23>> Zith 5/23>>  HISTORY OF PRESENT ILLNESS:  48 yo AAF from [place] 8 years ago. Never smoke, ETOH, drugs. Works as Theme park manager. No unusual exposures. Worked and ate dinner usual day 5/22, then woke after midnight with bubbly sensation in upper chest and smothering. Coughed small amounts BRB, gradually more. [facility] where CT showed diffuse fluffy infiltrates c/w pneumonia, flash edema or alveolar hemorrhage. Nausea and retched/ vomited twice, no coffee grounds. No bruising, fever, nodes, weight loss or leg discomfort. Irregular menses (tubes tied) latest 5/15 about ended. Recent uneventful 3 day plane trip to [place]. Similar episode in May, 2014 was interpreted here as atypical pneumonia, Rx'd w/ zith- resolved over 2 days. At that time she was recently back from trip to [place]. Not aware of personal hx of parasite/ worm infection or of family with hemoptysis.  PAST MEDICAL HISTORY :  Past Medical History  Diagnosis Date  . T2DM (type 2 diabetes mellitus)   . HTN (hypertension) 11/12/2012  . Obesity 12/09/2010  . Podagra 09/24/2012    Right great toe MTP joint  tenderness and redness, no skin breakdown, exam most consistent with podagra. NSAID and low-protein diet. Uric acid and CBC today.     Past Surgical History  Procedure Laterality Date  . Tubal ligation     Prior to Admission medications   Medication Sig Start Date End Date Taking? Authorizing Provider  albuterol (PROVENTIL HFA;VENTOLIN HFA) 108 (90 BASE) MCG/ACT inhaler Inhale 2 puffs into the lungs every 6 (six) hours as needed for wheezing or shortness of breath. 10/18/13   Lupita Dawn, MD  aspirin 81 MG tablet Take 81 mg by mouth daily.    Historical Provider, MD  cholecalciferol (VITAMIN D) 1000 UNITS tablet Take 1,000 Units by mouth daily.    Historical Provider, MD  glipiZIDE (GLUCOTROL) 10 MG tablet Take 1 tablet (10 mg total) by mouth 2 (two) times daily before a meal. 09/08/13   Zigmund Gottron, MD  lisinopril (PRINIVIL,ZESTRIL) 5 MG tablet TAKE ONE TABLET BY MOUTH ONCE DAILY 12/26/13   Leeanne Rio, MD  metFORMIN (GLUCOPHAGE-XR) 750 MG 24 hr tablet Take 1 tablet (750 mg total) by mouth 2 (two) times daily. 09/08/13   Zigmund Gottron, MD  simvastatin (ZOCOR) 20 MG tablet TAKE ONE TABLET BY MOUTH AT BEDTIME 12/01/13   Leeanne Rio, MD  sitaGLIPtin (JANUVIA) 100 MG tablet Take 1 tablet (100 mg total) by mouth daily. 09/08/13   Zigmund Gottron, MD  Spacer/Aero-Holding Chambers (E-Z SPACER) inhaler Use as instructed 10/18/13   Lupita Dawn, MD   Allergies  Allergen Reactions  . Fish Allergy Other (See Comments)  Seafood, gets nausea/diarrhea    FAMILY HISTORY:  Family History  Problem Relation Age of Onset  . Diabetes Father   . Diabetes Sister   . Diabetes Brother   . Breast cancer     SOCIAL HISTORY:  reports that she has never smoked. She does not have any smokeless tobacco history on file. She reports that she does not drink alcohol or use illicit drugs.  REVIEW OF SYSTEMS:  + indicates active  Constitutional: Negative for fever, chills, weight  loss, malaise/fatigue and diaphoresis.  HENT: Negative for hearing loss, ear pain, nosebleeds, congestion, sore throat, neck pain, tinnitus and ear discharge.   Eyes: Negative for blurred vision, double vision, photophobia, pain, discharge and redness.  Respiratory: Negative for +cough, +hemoptysis, sputum production, +shortness of breath, wheezing and stridor.   Cardiovascular: Negative for chest pain, palpitations, orthopnea, claudication, leg swelling and PND.  Gastrointestinal: Negative for heartburn, nausea, +vomiting, abdominal pain, diarrhea, constipation, blood in stool and melena.  Genitourinary: Negative for dysuria, urgency, frequency, hematuria and flank pain.  Musculoskeletal: Negative for myalgias, back pain, joint pain and falls.  Skin: Negative for itching and rash.  Neurological: Negative for dizziness, tingling, tremors, sensory change, speech change, focal weakness, seizures, loss of consciousness, weakness and headaches.  Endo/Heme/Allergies: Negative for environmental allergies and polydipsia. Does not bruise/bleed easily.  SUBJECTIVE:   VITAL SIGNS: Temp:  [98.1 F (36.7 C)-98.4 F (36.9 C)] 98.4 F (36.9 C) (05/23 1423) Pulse Rate:  [86-103] 94 (05/23 1423) Resp:  [14-25] 18 (05/23 1423) BP: (143-169)/(65-89) 143/65 mmHg (05/23 1423) SpO2:  [95 %-100 %] 100 % (05/23 1423) Weight:  [192 lb (87.091 kg)-192 lb 12.8 oz (87.454 kg)] 192 lb 12.8 oz (87.454 kg) (05/23 0602)  PHYSICAL EXAMINATION: General:  Tired AAF, NAD, cooperative Neuro:  Non-focal, oriented x 4 HEENT:  Atraumatic, conjunctivae and mucosa clear, gross vision and hearing nl Neck:  No stridor, voice scratchy Cardiovascular:  RRR, [id number], no murmur or rub Lungs:  Quiet, unlabored, no cough, rales,rhonchi or rub Abdomen: soft, no HSM Musculoskeletal:  Nl muscle bulk and apparent strength Skin:  Clear Nodes- none enlarged   Recent Labs Lab 01/07/14 0204  NA 144  K 3.8  CL 102  CO2 29  BUN 9    CREATININE 0.70  GLUCOSE 184*    Recent Labs Lab 01/07/14 0204  HGB 12.4  HCT 35.8*  WBC 5.8  PLT 243   Dg Chest 2 View  01/07/2014   CLINICAL DATA:  Hemoptysis, cough, and shortness of breath.  EXAM: CHEST  2 VIEW  COMPARISON:  12/30/2012  FINDINGS: There small vague areas of increased density in both upper lobes, nonspecific. Heart size and vascularity are normal. No effusions. No acute osseous abnormality.  IMPRESSION: Subtle areas of increased density in both upper lobes which could represent small areas of infiltrate.   Electronically Signed   By: Rozetta Nunnery M.D.   On: 01/07/2014 02:25   Ct Angio Chest Pe W/cm &/or Wo Cm  01/07/2014   CLINICAL DATA:  Cough.  Hemoptysis.  Shortness of breath.  EXAM: CT ANGIOGRAPHY CHEST WITH CONTRAST  TECHNIQUE: Multidetector CT imaging of the chest was performed using the standard protocol during bolus administration of intravenous contrast. Multiplanar CT image reconstructions and MIPs were obtained to evaluate the vascular anatomy.  CONTRAST:  183mL OMNIPAQUE IOHEXOL 350 MG/ML SOLN  COMPARISON:  Chest radiograph performed earlier today at 2 a.m., and CTA of the chest performed 01/30/2012  FINDINGS: There is no evidence  of pulmonary embolus.  Fluffy airspace opacification is noted within both lungs, raising concern for mild diffuse pulmonary alveolar hemorrhage, given the patient's hemoptysis. There is no evidence of pleural effusion or pneumothorax. No masses are identified; no abnormal focal contrast enhancement is seen.  The mediastinum is unremarkable in appearance. No mediastinal lymphadenopathy is seen. No pericardial effusion is identified. The great vessels are grossly unremarkable in appearance. No axillary lymphadenopathy is seen. The visualized portions of the thyroid gland are unremarkable in appearance.  The visualized portions of the liver and spleen are unremarkable.  No acute osseous abnormalities are seen.  Review of the MIP images confirms  the above findings.  IMPRESSION: 1. No evidence of pulmonary embolus. 2. Fluffy bilateral airspace opacification, raising concern for mild diffuse pulmonary alveolar hemorrhage, given the patient's hemoptysis.   Electronically Signed   By: Garald Balding M.D.   On: 01/07/2014 03:47    ASSESSMENT / PLAN:  HEMOPTYSIS- Alveolar hemorrhage- Doubt endometriosis. Can't exclude Diphyllobothrium or similar migratory parasitic infection, given that she had a similar episode 1 year ago. Yield from bronchoscopy is likely to be low. This would be unusual result of common viral or bacterial pneumonias, without more of a prodrome. Labs for vasculitis-ANCA Otherwise follow conservatively, with CXR in 1-2 days. We will follow.  Recommend-  Stool for ova and parasites, Ok to continue current antibiotics empirically CXR 5/26 ANCA, sed rate, ANA, RF    Pulmonary and [place] Pager: [phone]  01/07/2014, 4:28 PM

## 2014-01-07 NOTE — H&P (Signed)
History and Physical       Hospital Admission Note Date: 01/07/2014  Patient name: Brandy Lynn Medical record number: [id number] Date of birth: [birth year] Age: 48 y.o. Gender: female  PCP: Nicola Girt    Chief Complaint:  Coughing with sudden hemoptysis today   HPI: Patient is a 48 year old female with history of type 2 diabetes mellitus, hypertension presented to med center [facility] ER with sudden hemoptysis this morning. History was obtained from the patient who reported that she was in her baseline health yesterday with no fevers, chills or any issues. This morning she woke up around midnight with coughing and moderate amount of hemoptysis, bright red blood and shortness of breath. Patient reported as a couple of episodes at home and then a couple in ER. She denied any chest pain, fevers chills. She had gone to [place] last week for 3 days otherwise no long-distance air flights. No recent sick contacts. Patient denied being sick recently, no travel outside the country, no night sweats or any weight loss. No rashes.    Review of Systems:  Constitutional: Denies fever, chills, diaphoresis, poor appetite and fatigue.  HEENT: Denies photophobia, eye pain, redness, hearing loss, ear pain, congestion, sore throat, rhinorrhea, sneezing, mouth sores, trouble swallowing, neck pain, neck stiffness and tinnitus.   Respiratory:  please see history of present illness Cardiovascular: Denies chest pain, palpitations and leg swelling.  Gastrointestinal: Denies nausea, vomiting, abdominal pain, diarrhea, constipation, blood in stool and abdominal distention.  Genitourinary: Denies dysuria, urgency, frequency, hematuria, flank pain and difficulty urinating.  Musculoskeletal: Denies myalgias, back pain, joint swelling, arthralgias and gait problem.  Skin: Denies pallor, rash and wound.  Neurological: Denies dizziness, seizures, syncope,  weakness, light-headedness, numbness and headaches.  Hematological: Denies adenopathy. Easy bruising, personal or family bleeding history  Psychiatric/Behavioral: Denies suicidal ideation, mood changes, confusion, nervousness, sleep disturbance and agitation  Past Medical History: Past Medical History  Diagnosis Date  . T2DM (type 2 diabetes mellitus)   . HTN (hypertension) 11/12/2012  . Obesity 12/09/2010  . 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 NSAID and low-protein diet. Uric acid and CBC today.     Past Surgical History  Procedure Laterality Date  . Tubal ligation      Medications: Prior to Admission medications   Medication Sig Start Date End Date Taking? Authorizing Provider  albuterol (PROVENTIL HFA;VENTOLIN HFA) 108 (90 BASE) MCG/ACT inhaler Inhale 2 puffs into the lungs every 6 (six) hours as needed for wheezing or shortness of breath. 10/18/13   Lupita Dawn, MD  aspirin 81 MG tablet Take 81 mg by mouth daily.    Historical Provider, MD  cholecalciferol (VITAMIN D) 1000 UNITS tablet Take 1,000 Units by mouth daily.    Historical Provider, MD  glipiZIDE (GLUCOTROL) 10 MG tablet Take 1 tablet (10 mg total) by mouth 2 (two) times daily before a meal. 09/08/13   Zigmund Gottron, MD  lisinopril (PRINIVIL,ZESTRIL) 5 MG tablet TAKE ONE TABLET BY MOUTH ONCE DAILY 12/26/13   Leeanne Rio, MD  metFORMIN (GLUCOPHAGE-XR) 750 MG 24 hr tablet Take 1 tablet (750 mg total) by mouth 2 (two) times daily. 09/08/13   Zigmund Gottron, MD  simvastatin (ZOCOR) 20 MG tablet TAKE ONE TABLET BY MOUTH AT BEDTIME 12/01/13   Leeanne Rio, MD  sitaGLIPtin (JANUVIA) 100 MG tablet Take 1 tablet (100 mg total) by mouth daily. 09/08/13  Zigmund Gottron, MD  Spacer/Aero-Holding Chambers (E-Z SPACER) inhaler Use as instructed 10/18/13   Lupita Dawn, MD    Allergies:   Allergies  Allergen Reactions   . Fish Allergy Other (See Comments)    Seafood, gets nausea/diarrhea    Social History:  reports that she has never smoked. She does not have any smokeless tobacco history on file. She reports that she does not drink alcohol or use illicit drugs.  Family History: Family History  Problem Relation Age of Onset  . Diabetes Father   . Diabetes Sister   . Diabetes Brother   . Breast cancer      Physical Exam: Blood pressure 153/69, pulse 98, temperature 98.1 F (36.7 C), temperature source Oral, resp. rate 17, weight 87.454 kg (192 lb 12.8 oz), last menstrual period 12/30/2013, SpO2 100.00%. General: Alert, awake, oriented x3, in no acute distress. HEENT: normocephalic, atraumatic, anicteric sclera, pink conjunctiva, pupils equal and reactive to light and accomodation, oropharynx clear Neck: supple, no masses or lymphadenopathy, no goiter, no bruits  Heart: Regular rate and rhythm, without murmurs, rubs or gallops. Lungs: Decreased breath sounds at the bases with mild scattered wheezing Abdomen: Soft, nontender, nondistended, positive bowel sounds, no masses. Extremities: No clubbing, cyanosis or edema with positive pedal pulses. Neuro: Grossly intact, no focal neurological deficits, strength 5/5 upper and lower extremities bilaterally Psych: alert and oriented x 3, normal mood and affect Skin: no rashes or lesions, warm and dry   LABS on Admission:  Basic Metabolic Panel:  Recent Labs Lab 01/07/14 0204  NA 144  K 3.8  CL 102  CO2 29  GLUCOSE 184*  BUN 9  CREATININE 0.70  CALCIUM 10.1   Liver Function Tests: No results found for this basename: AST, ALT, ALKPHOS, BILITOT, PROT, ALBUMIN,  in the last 168 hours No results found for this basename: LIPASE, AMYLASE,  in the last 168 hours No results found for this basename: AMMONIA,  in the last 168 hours CBC:  Recent Labs Lab 01/07/14 0204  WBC 5.8  NEUTROABS 2.7  HGB 12.4  HCT 35.8*  MCV 86.1  PLT 243   Cardiac  Enzymes:  Recent Labs Lab 01/07/14 0229  TROPONINI <0.30   BNP: No components found with this basename: POCBNP,  CBG: No results found for this basename: GLUCAP,  in the last 168 hours   Radiological Exams on Admission: Dg Chest 2 View  01/07/2014   CLINICAL DATA:  Hemoptysis, cough, and shortness of breath.  EXAM: CHEST  2 VIEW  COMPARISON:  12/30/2012  FINDINGS: There small vague areas of increased density in both upper lobes, nonspecific. Heart size and vascularity are normal. No effusions. No acute osseous abnormality.  IMPRESSION: Subtle areas of increased density in both upper lobes which could represent small areas of infiltrate.   Electronically Signed   By: Rozetta Nunnery M.D.   On: 01/07/2014 02:25   Ct Angio Chest Pe W/cm &/or Wo Cm  01/07/2014   CLINICAL DATA:  Cough.  Hemoptysis.  Shortness of breath.  EXAM: CT ANGIOGRAPHY CHEST WITH CONTRAST  TECHNIQUE: Multidetector CT imaging of the chest was performed using the standard protocol during bolus administration of intravenous contrast. Multiplanar CT image reconstructions and MIPs were obtained to evaluate the vascular anatomy.  CONTRAST:  149mL OMNIPAQUE IOHEXOL 350 MG/ML SOLN  COMPARISON:  Chest radiograph performed earlier today at 2 a.m., and CTA of the chest performed 01/30/2012  FINDINGS: There is no evidence of pulmonary embolus.  Fluffy airspace opacification is noted within both lungs, raising concern for mild diffuse pulmonary alveolar hemorrhage, given the patient's hemoptysis. There is no evidence of pleural effusion or pneumothorax. No masses are identified; no abnormal focal contrast enhancement is seen.  The mediastinum is unremarkable in appearance. No mediastinal lymphadenopathy is seen. No pericardial effusion is identified. The great vessels are grossly unremarkable in appearance. No axillary lymphadenopathy is seen. The visualized portions of the thyroid gland are unremarkable in appearance.  The visualized portions of  the liver and spleen are unremarkable.  No acute osseous abnormalities are seen.  Review of the MIP images confirms the above findings.  IMPRESSION: 1. No evidence of pulmonary embolus. 2. Fluffy bilateral airspace opacification, raising concern for mild diffuse pulmonary alveolar hemorrhage, given the patient's hemoptysis.   Electronically Signed   By: Garald Balding M.D.   On: 01/07/2014 03:47    Assessment/Plan Principal Problem: Sudden Hemoptysis with possible community-acquired pneumonia/pulmonary hemorrhage; unclear etiology at this point, patient was completely in her baseline health yesterday with no fevers or any coughing or recent sickness. No recent weight loss or loss of appetite or any travels out of the country. Patient has lived in [place] for most of her life.  - Patient has no further hemoptysis since admission, will admit for further observation, for now placed on IV Zithromax and Rocephin,  Bronchodilators PRN. - Obtain urine legionella antigen, urine strep antigen, HIV.  - Pulmonology consult obtained, discussed with Dr. Annamaria Boots, patient may need bronchoscopy for further workup, defer to pulmonology - Hold aspirin  Active Problems:   DM (diabetes mellitus), type 2, uncontrolled - Placed on top of a poor diet, sliding scale insulin cocaine hemoglobin A1c    HTN (hypertension) - Continue lisinopril for now   DVT prophylaxis: SCDs  CODE STATUS: Full code  Family Communication: Admission, patients condition and plan of care including tests being ordered have been discussed with the patient  who indicates understanding and agree with the plan and Code Status   Further plan will depend as patient's clinical course evolves and further radiologic and laboratory data become available.   Time Spent on Admission: 1 hour  Nathasha Fiorillo Krystal Eaton M.D. [facility] 01/07/2014, 10:59 AM Pager: [phone]  If 7PM-7AM, please contact night-coverage [email or web address] Password  [email or web address]  **Disclaimer: This note was dictated with voice recognition software. Similar sounding words can inadvertently be transcribed and this note may contain transcription errors which may not have been corrected upon publication of note.**

## 2014-01-08 DIAGNOSIS — I10 Essential (primary) hypertension: Secondary | ICD-10-CM

## 2014-01-08 LAB — CBC
HCT: 31.8 % — ABNORMAL LOW (ref 36.0–46.0)
Hemoglobin: 11.2 g/dL — ABNORMAL LOW (ref 12.0–15.0)
MCH: 29.6 pg (ref 26.0–34.0)
MCHC: 35.2 g/dL (ref 30.0–36.0)
MCV: 83.9 fL (ref 78.0–100.0)
PLATELETS: 205 10*3/uL (ref 150–400)
RBC: 3.79 MIL/uL — ABNORMAL LOW (ref 3.87–5.11)
RDW: 13.9 % (ref 11.5–15.5)
WBC: 5.5 10*3/uL (ref 4.0–10.5)

## 2014-01-08 LAB — URINE CULTURE: Colony Count: >=100000

## 2014-01-08 LAB — LEGIONELLA ANTIGEN, URINE: Legionella Antigen, Urine: NEGATIVE

## 2014-01-08 LAB — BASIC METABOLIC PANEL
BUN: 10 mg/dL (ref 6–23)
CALCIUM: 8.8 mg/dL (ref 8.4–10.5)
CO2: 26 mEq/L (ref 19–32)
Chloride: 102 mEq/L (ref 96–112)
Creatinine, Ser: 0.52 mg/dL (ref 0.50–1.10)
GFR calc Af Amer: >90 mL/min (ref >90)
GLUCOSE: 174 mg/dL — AB (ref 70–99)
Potassium: 4 mEq/L (ref 3.7–5.3)
SODIUM: 139 meq/L (ref 137–147)

## 2014-01-08 LAB — GLUCOSE, CAPILLARY
GLUCOSE-CAPILLARY: 189 mg/dL — AB (ref 70–99)
Glucose-Capillary: 181 mg/dL — ABNORMAL HIGH (ref 70–99)
Glucose-Capillary: 174 mg/dL — ABNORMAL HIGH (ref 70–99)
Glucose-Capillary: 163 mg/dL — ABNORMAL HIGH (ref 70–99)

## 2014-01-08 LAB — RHEUMATOID FACTOR

## 2014-01-08 LAB — C-REACTIVE PROTEIN: CRP: 0.8 mg/dL — ABNORMAL HIGH (ref <0.60)

## 2014-01-08 MED ORDER — PNEUMOCOCCAL VAC POLYVALENT 25 MCG/0.5ML IJ INJ
0.5000 mL | INJECTION | INTRAMUSCULAR | Status: AC
  Administered 2014-01-09: 0.5 mL via INTRAMUSCULAR
  Filled 2014-01-08: qty 0.5

## 2014-01-08 MED ORDER — HYDROCODONE-ACETAMINOPHEN 5-325 MG PO TABS
1.0000 | ORAL_TABLET | Freq: Four times a day (QID) | ORAL | Status: DC | PRN
  Administered 2014-01-08: 1 via ORAL
  Filled 2014-01-08: qty 1

## 2014-01-08 NOTE — Progress Notes (Signed)
Name: Brandy Lynn MRN: [id number] DOB: 06/12/1966    ADMISSION DATE:  01/07/2014 CONSULTATION DATE:  01/09/14 REFERRING MD :  Dr Tana Coast PRIMARY SERVICE:  [facility]  CHIEF COMPLAINT:  Coughed blood  BRIEF PATIENT DESCRIPTION: 48 yoF never smoker, no ETOH, from [place] 18 years ago, diabetic. Otherwise in usual good health. Went to bed and woke smothering with bubbly sensation in upper chest, coughed up BRB. No fever or chest pain. CT [facility] neg for PE, showed diffuse fluffy infiltrates. Bleeding has slowed, less dyspnea. PCCM called for opinion. Similar episode May, 2014 resolved over 2 days, regarded as culture neg pneumonia.  SIGNIFICANT EVENTS / STUDIES:  CT chest 5/23  LINES / TUBES: PIV  CULTURES: Influenza antigen 5/23 Neg Urine 5/23> Blood 5/15> Malaria smear 5/15>   ANTIBIOTICS: Roceph 5/23>> Zith 5/23>>  HISTORY OF PRESENT ILLNESS:  48 yo AAF from [place] 8 years ago. Never smoke, ETOH, drugs. Works as Theme park manager. No unusual exposures. Worked and ate dinner usual day 5/22, then woke after midnight with bubbly sensation in upper chest and smothering. Coughed small amounts BRB, gradually more. [facility] where CT showed diffuse fluffy infiltrates c/w pneumonia, flash edema or alveolar hemorrhage. Nausea and retched/ vomited twice, no coffee grounds. No bruising, fever, nodes, weight loss or leg discomfort. Irregular menses (tubes tied) latest 5/15 about ended. Recent uneventful 3 day plane trip to [place]. Similar episode in May, 2014 was interpreted here as atypical pneumonia, Rx'd w/ zith- resolved over 2 days. At that time she was recently back from trip to [place]. Not aware of personal hx of parasite/ worm infection or of family with hemoptysis.   SUBJECTIVE:  Frontal headache not relieved by tylenol. No more heme or nausea and otw feeling better  VITAL SIGNS: Temp:  [98.3 F (36.8 C)-99 F (37.2 C)] 98.3 F (36.8 C) (05/24 0421) Pulse Rate:   [73-98] 73 (05/24 0421) Resp:  [17-21] 21 (05/24 0421) BP: (121-153)/(65-82) 121/82 mmHg (05/24 0421) SpO2:  [99 %-100 %] 99 % (05/24 0421)  PHYSICAL EXAMINATION: General:  Pleasant AAF, NAD, cooperative Neuro:  Non-focal, oriented x 4, ambulatory HEENT:  Atraumatic, conjunctivae and mucosa clear, gross vision and hearing nl Neck:  No stridor,  Cardiovascular:  RRR, NlS1S2, no murmur or rub Lungs:  Quiet, unlabored, no cough, rales,rhonchi or rub Abdomen: soft, no HSM Musculoskeletal:  Nl muscle bulk and apparent strength Skin:  Clear Nodes- none enlarged   Recent Labs Lab 01/07/14 0204 01/08/14 0435  NA 144 139  K 3.8 4.0  CL 102 102  CO2 29 26  BUN 9 10  CREATININE 0.70 0.52  GLUCOSE 184* 174*    Recent Labs Lab 01/07/14 0204 01/08/14 0435  HGB 12.4 11.2*  HCT 35.8* 31.8*  WBC 5.8 5.5  PLT 243 205   Dg Chest 2 View  01/07/2014   CLINICAL DATA:  Hemoptysis, cough, and shortness of breath.  EXAM: CHEST  2 VIEW  COMPARISON:  12/30/2012  FINDINGS: There small vague areas of increased density in both upper lobes, nonspecific. Heart size and vascularity are normal. No effusions. No acute osseous abnormality.  IMPRESSION: Subtle areas of increased density in both upper lobes which could represent small areas of infiltrate.   Electronically Signed   By: Rozetta Nunnery M.D.   On: 01/07/2014 02:25   Ct Angio Chest Pe W/cm &/or Wo Cm  01/07/2014   CLINICAL DATA:  Cough.  Hemoptysis.  Shortness of breath.  EXAM: CT ANGIOGRAPHY  CHEST WITH CONTRAST  TECHNIQUE: Multidetector CT imaging of the chest was performed using the standard protocol during bolus administration of intravenous contrast. Multiplanar CT image reconstructions and MIPs were obtained to evaluate the vascular anatomy.  CONTRAST:  134m OMNIPAQUE IOHEXOL 350 MG/ML SOLN  COMPARISON:  Chest radiograph performed earlier today at 2 a.m., and CTA of the chest performed 01/30/2012  FINDINGS: There is no evidence of pulmonary  embolus.  Fluffy airspace opacification is noted within both lungs, raising concern for mild diffuse pulmonary alveolar hemorrhage, given the patient's hemoptysis. There is no evidence of pleural effusion or pneumothorax. No masses are identified; no abnormal focal contrast enhancement is seen.  The mediastinum is unremarkable in appearance. No mediastinal lymphadenopathy is seen. No pericardial effusion is identified. The great vessels are grossly unremarkable in appearance. No axillary lymphadenopathy is seen. The visualized portions of the thyroid gland are unremarkable in appearance.  The visualized portions of the liver and spleen are unremarkable.  No acute osseous abnormalities are seen.  Review of the MIP images confirms the above findings.  IMPRESSION: 1. No evidence of pulmonary embolus. 2. Fluffy bilateral airspace opacification, raising concern for mild diffuse pulmonary alveolar hemorrhage, given the patient's hemoptysis.   Electronically Signed   By: JGarald BaldingM.D.   On: 01/07/2014 03:47    ASSESSMENT / PLAN:  HEMOPTYSIS- Alveolar hemorrhage- Doubt endometriosis. Can't exclude Diphyllobothrium or similar migratory parasitic infection, given that she had a similar episode 1 year ago. Yield from bronchoscopy is likely to be low. This would be unusual result of common viral or bacterial pneumonias, without more of a prodrome. Labs for vasculitis-ANCA pending, CRP 0.8, ESR 13 Otherwise follow conservatively, with CXR in 1-2 days. We will follow.  Recommend-  Stool for ova and parasites- pending collection Ok to continue current antibiotics empirically CXR 5/26 ANCA, sed rate, ANA, RF- pending    Pulmonary and [place]Pager: [phone] 01/08/2014, 9:35 AM

## 2014-01-08 NOTE — Progress Notes (Signed)
PROGRESS NOTE  Brandy Lynn OZD:[id number] DOB: [birth year] DOA: 01/07/2014 PCP: Doug Sou B  Assessment/Plan: HEMOPTYSIS- Alveolar hemorrhage- 2nd episode -pulm consult: Yield from bronchoscopy is likely to be low. This would be unusual result of common viral or bacterial pneumonias, without more of a prodrome. Stool for ova and parasites,  Ok to continue current antibiotics empirically  CXR 5/26  ANCA, sed rate, ANA, RF   DM -SSI  HTN  Code Status: full Family Communication: patient Disposition Plan: admit   Consultants:  pulm  Procedures:      HPI/Subjective: Feeling better Not requiring O2  Objective: Filed Vitals:   01/08/14 0421  BP: 121/82  Pulse: 73  Temp: 98.3 F (36.8 C)  Resp: 21    Intake/Output Summary (Last 24 hours) at 01/08/14 0827 Last data filed at 01/08/14 0700  Gross per 24 hour  Intake 2108.75 ml  Output   1500 ml  Net 608.75 ml   Filed Weights   01/07/14 0142 01/07/14 0602  Weight: 87.091 kg (192 lb) 87.454 kg (192 lb 12.8 oz)    Exam:  Heart: Regular rate and rhythm, without murmurs, rubs or gallops.  Lungs: Decreased breath sounds at the bases with mild scattered wheezing  Abdomen: Soft, nontender, nondistended, positive bowel sounds, no masses.  Extremities: No clubbing, cyanosis or edema with positive pedal pulses.  Neuro: Grossly intact, no focal neurological deficits, strength 5/5 upper and lower extremities bilaterally   Data Reviewed: Basic Metabolic Panel:  Recent Labs Lab 01/07/14 0204 01/08/14 0435  NA 144 139  K 3.8 4.0  CL 102 102  CO2 29 26  GLUCOSE 184* 174*  BUN 9 10  CREATININE 0.70 0.52  CALCIUM 10.1 8.8   Liver Function Tests: No results found for this basename: AST, ALT, ALKPHOS, BILITOT, PROT, ALBUMIN,  in the last 168 hours No results found for this basename: LIPASE, AMYLASE,  in the last 168 hours No results found for this basename: AMMONIA,  in the last 168  hours CBC:  Recent Labs Lab 01/07/14 0204 01/08/14 0435  WBC 5.8 5.5  NEUTROABS 2.7  --   HGB 12.4 11.2*  HCT 35.8* 31.8*  MCV 86.1 83.9  PLT 243 205   Cardiac Enzymes:  Recent Labs Lab 01/07/14 0229  TROPONINI <0.30   BNP (last 3 results)  Recent Labs  01/07/14 0353  PROBNP 84.7   CBG:  Recent Labs Lab 01/07/14 1109 01/07/14 1625 01/07/14 2127 01/08/14 0559  GLUCAP 185* 78 137* 181*    No results found for this or any previous visit (from the past 240 hour(s)).   Studies: Dg Chest 2 View  01/07/2014   CLINICAL DATA:  Hemoptysis, cough, and shortness of breath.  EXAM: CHEST  2 VIEW  COMPARISON:  12/30/2012  FINDINGS: There small vague areas of increased density in both upper lobes, nonspecific. Heart size and vascularity are normal. No effusions. No acute osseous abnormality.  IMPRESSION: Subtle areas of increased density in both upper lobes which could represent small areas of infiltrate.   Electronically Signed   By: Rozetta Nunnery M.D.   On: 01/07/2014 02:25   Ct Angio Chest Pe W/cm &/or Wo Cm  01/07/2014   CLINICAL DATA:  Cough.  Hemoptysis.  Shortness of breath.  EXAM: CT ANGIOGRAPHY CHEST WITH CONTRAST  TECHNIQUE: Multidetector CT imaging of the chest was performed using the standard protocol during bolus administration of intravenous contrast. Multiplanar CT image reconstructions and MIPs were obtained to evaluate the vascular anatomy.  CONTRAST:  178mL OMNIPAQUE IOHEXOL 350 MG/ML SOLN  COMPARISON:  Chest radiograph performed earlier today at 2 a.m., and CTA of the chest performed 01/30/2012  FINDINGS: There is no evidence of pulmonary embolus.  Fluffy airspace opacification is noted within both lungs, raising concern for mild diffuse pulmonary alveolar hemorrhage, given the patient's hemoptysis. There is no evidence of pleural effusion or pneumothorax. No masses are identified; no abnormal focal contrast enhancement is seen.  The mediastinum is unremarkable in  appearance. No mediastinal lymphadenopathy is seen. No pericardial effusion is identified. The great vessels are grossly unremarkable in appearance. No axillary lymphadenopathy is seen. The visualized portions of the thyroid gland are unremarkable in appearance.  The visualized portions of the liver and spleen are unremarkable.  No acute osseous abnormalities are seen.  Review of the MIP images confirms the above findings.  IMPRESSION: 1. No evidence of pulmonary embolus. 2. Fluffy bilateral airspace opacification, raising concern for mild diffuse pulmonary alveolar hemorrhage, given the patient's hemoptysis.   Electronically Signed   By: Garald Balding M.D.   On: 01/07/2014 03:47    Scheduled Meds: . azithromycin  500 mg Intravenous Q24H  . cefTRIAXone (ROCEPHIN)  IV  1 g Intravenous Q24H  . cholecalciferol  1,000 Units Oral Daily  . insulin aspart  0-5 Units Subcutaneous QHS  . insulin aspart  0-9 Units Subcutaneous TID WC  . lisinopril  5 mg Oral Daily  . simvastatin  20 mg Oral q1800   Continuous Infusions: . sodium chloride 75 mL/hr at 01/07/14 1053   Antibiotics Given (last 72 hours)   Date/Time Action Medication Dose Rate   01/07/14 1203 Given   azithromycin (ZITHROMAX) 500 mg in dextrose 5 % 250 mL IVPB 500 mg 250 mL/hr   01/07/14 1512 Given   cefTRIAXone (ROCEPHIN) 1 g in dextrose 5 % 50 mL IVPB 1 g 100 mL/hr      Principal Problem:   Hemoptysis Active Problems:   DM (diabetes mellitus), type 2, uncontrolled   HTN (hypertension)   CAP (community acquired pneumonia)   Pulmonary hemorrhage   Hemoptysis, unspecified    Time spent: 32 min    [facility] Pager [phone]. If 7PM-7AM, please contact night-coverage at [email or web address], password [email or web address] 01/08/2014, 8:27 AM  LOS: 1 day

## 2014-01-09 LAB — BASIC METABOLIC PANEL
BUN: 12 mg/dL (ref 6–23)
CALCIUM: 8.9 mg/dL (ref 8.4–10.5)
CHLORIDE: 103 meq/L (ref 96–112)
CO2: 27 meq/L (ref 19–32)
Creatinine, Ser: 0.5 mg/dL (ref 0.50–1.10)
GFR calc Af Amer: >90 mL/min (ref >90)
GFR calc non Af Amer: >90 mL/min (ref >90)
GLUCOSE: 172 mg/dL — AB (ref 70–99)
POTASSIUM: 4.1 meq/L (ref 3.7–5.3)
SODIUM: 139 meq/L (ref 137–147)

## 2014-01-09 LAB — CBC
HCT: 30.3 % — ABNORMAL LOW (ref 36.0–46.0)
HEMOGLOBIN: 10.2 g/dL — AB (ref 12.0–15.0)
MCH: 28.4 pg (ref 26.0–34.0)
MCHC: 33.7 g/dL (ref 30.0–36.0)
MCV: 84.4 fL (ref 78.0–100.0)
Platelets: 190 10*3/uL (ref 150–400)
RBC: 3.59 MIL/uL — ABNORMAL LOW (ref 3.87–5.11)
RDW: 13.7 % (ref 11.5–15.5)
WBC: 5 10*3/uL (ref 4.0–10.5)

## 2014-01-09 LAB — GLUCOSE, CAPILLARY
Glucose-Capillary: 152 mg/dL — ABNORMAL HIGH (ref 70–99)
Glucose-Capillary: 163 mg/dL — ABNORMAL HIGH (ref 70–99)
Glucose-Capillary: 122 mg/dL — ABNORMAL HIGH (ref 70–99)
Glucose-Capillary: 205 mg/dL — ABNORMAL HIGH (ref 70–99)
Glucose-Capillary: 218 mg/dL — ABNORMAL HIGH (ref 70–99)

## 2014-01-09 LAB — ANCA SCREEN W REFLEX TITER
Atypical p-ANCA Screen: NEGATIVE
c-ANCA Screen: NEGATIVE
p-ANCA Screen: NEGATIVE

## 2014-01-09 NOTE — Progress Notes (Addendum)
   Name: Brandy Lynn MRN: [id number] DOB: Jul 10, 1966    ADMISSION DATE:  01/07/2014 CONSULTATION DATE: 5/24 REFERRING MD :  Dr Tana Coast PRIMARY SERVICE:  [facility]  CHIEF COMPLAINT:  Coughed blood  BRIEF PATIENT DESCRIPTION: 67 yoF never smoker, no ETOH, from [place] 18 years ago, diabetic. Otherwise in usual good health. Went to bed and woke smothering with bubbly sensation in upper chest, coughed up BRB. No fever or chest pain. CT [facility] neg for PE, showed diffuse fluffy infiltrates. Bleeding has slowed, less dyspnea. PCCM called for opinion. Similar episode May, 2014 resolved over 2 days, regarded as culture neg pneumonia.  SIGNIFICANT EVENTS / STUDIES:  CT chest 5/23  LINES / TUBES: PIV  CULTURES: Influenza antigen 5/23 > Neg Urine 5/23> Pos Grop B Strep 100k Urine legionella 5/23 > neg Urine strep 5/23 > neg Blood x 2 5/23>    ANTIBIOTICS: Roceph 5/23>> Zith 5/23>>     SUBJECTIVE:   No more significant hemoptysis  VITAL SIGNS: Temp:  [98.4 F (36.9 C)-98.7 F (37.1 C)] 98.4 F (36.9 C) (05/25 0505) Pulse Rate:  [72-78] 72 (05/25 0505) Resp:  [18-20] 18 (05/25 0505) BP: (134-140)/(69-81) 134/80 mmHg (05/25 0505) SpO2:  [99 %-100 %] 99 % (05/25 0505) FIO2   RA   PHYSICAL EXAMINATION: General:  Pleasant AAF, NAD, cooperative Neuro:  Non-focal, oriented x 4, ambulatory HEENT:  oroph clear Neck:  No stridor,  Cardiovascular:  RRR, NlS1S2, no murmur or rub Lungs:  Quiet, unlabored, no cough, rales,rhonchi or rub Abdomen: soft, no HSM Musculoskeletal:  Nl muscle bulk and apparent strength Skin:  Clear Nodes- none enlarged   Recent Labs Lab 01/07/14 0204 01/08/14 0435 01/09/14 0409  NA 144 139 139  K 3.8 4.0 4.1  CL 102 102 103  CO2 _0 BUN _1 CREATININE 0.70 0.52 0.50  GLUCOSE 184* 174* 172*    Recent Labs Lab 01/07/14 0204 01/08/14 0435 01/09/14 0409  HGB 12.4 11.2* 10.2*  HCT 35.8* 31.8* 30.3*  WBC 5.8 5.5 5.0  PLT 243  205 190   No results found.  ASSESSMENT / PLAN:  HEMOPTYSIS- Alveolar hemorrhage- Doubt endometriosis. Can't exclude Diphyllobothrium or similar migratory parasitic infection, given that she had a similar episode 1 year ago. Yield from bronchoscopy is likely to be low. This would be unusual result of common viral or bacterial pneumonias, without more of a prodrome. Labs for vasculitis-ANCA pending, CRP 0.8, ESR 13 Otherwise follow conservatively, with CXR in 1-2 days.    Recommend-  Stool for ova and parasites- pending collection Ok to continue current antibiotics empirically CXR 5/26 ANCA, sed rate, ANA, RF- pending    Would avoid ACEi in this setting (nl bnp) despite absence of a choking hx prior to onset as we see this exact pattern in pts with transient  UAO even if occuring during sleep.  The nl esr is strongly against inflammatory or infectious cause.  She should not be taking asa in any form.     Christinia Gully, MD Pulmonary and [place] [phone] After 5:30 PM or weekends, call [phone]

## 2014-01-09 NOTE — Progress Notes (Signed)
PROGRESS NOTE  Brandy Lynn QPY:[id number] DOB: [birth year] DOA: 01/07/2014 PCP: Doug Sou B  Assessment/Plan: HEMOPTYSIS- Alveolar hemorrhage- 2nd episode -pulm consult: Yield from bronchoscopy is likely to be low. This would be unusual result of common viral or bacterial pneumonias, without more of a prodrome. Stool for ova and parasites,  Ok to continue current antibiotics empirically  CXR 5/26  ANCA, sed rate ok, ANA, RF  crp- mild elevated  DM -SSI -resume metformin at d/c  UTI-  -group B strep  HTN  Code Status: full Family Communication: patient Disposition Plan: admit   Consultants:  pulm  Procedures:      HPI/Subjective: Not on O2 Wants to go home No further issues  Objective: Filed Vitals:   01/09/14 0505  BP: 134/80  Pulse: 72  Temp: 98.4 F (36.9 C)  Resp: 18    Intake/Output Summary (Last 24 hours) at 01/09/14 0924 Last data filed at 01/09/14 0500  Gross per 24 hour  Intake    480 ml  Output   1300 ml  Net   -820 ml   Filed Weights   01/07/14 0142 01/07/14 0602  Weight: 87.091 kg (192 lb) 87.454 kg (192 lb 12.8 oz)    Exam:  Heart: Regular rate and rhythm, without murmurs, rubs or gallops.  Lungs: clear  Abdomen: Soft, nontender, nondistended, positive bowel sounds, no masses.  Extremities: No clubbing, cyanosis or edema with positive pedal pulses.  Neuro: Grossly intact, no focal neurological deficits, strength 5/5 upper and lower extremities bilaterally   Data Reviewed: Basic Metabolic Panel:  Recent Labs Lab 01/07/14 0204 01/08/14 0435 01/09/14 0409  NA 144 139 139  K 3.8 4.0 4.1  CL 102 102 103  CO2 29 26 27   GLUCOSE 184* 174* 172*  BUN 9 10 12   CREATININE 0.70 0.52 0.50  CALCIUM 10.1 8.8 8.9   Liver Function Tests: No results found for this basename: AST, ALT, ALKPHOS, BILITOT, PROT, ALBUMIN,  in the last 168 hours No results found for this basename: LIPASE, AMYLASE,  in the last 168 hours No  results found for this basename: AMMONIA,  in the last 168 hours CBC:  Recent Labs Lab 01/07/14 0204 01/08/14 0435 01/09/14 0409  WBC 5.8 5.5 5.0  NEUTROABS 2.7  --   --   HGB 12.4 11.2* 10.2*  HCT 35.8* 31.8* 30.3*  MCV 86.1 83.9 84.4  PLT 243 205 190   Cardiac Enzymes:  Recent Labs Lab 01/07/14 0229  TROPONINI <0.30   BNP (last 3 results)  Recent Labs  01/07/14 0353  PROBNP 84.7   CBG:  Recent Labs Lab 01/08/14 0559 01/08/14 1118 01/08/14 1621 01/08/14 2121 01/09/14 0551  GLUCAP 181* 189* 174* 163* 152*    Recent Results (from the past 240 hour(s))  CULTURE, BLOOD (ROUTINE X 2)     Status: None   Collection Time    01/07/14  8:30 AM      Result Value Ref Range Status   Specimen Description BLOOD RIGHT HAND   Final   Special Requests BOTTLES DRAWN AEROBIC ONLY 10CC   Final   Culture  Setup Time     Final   Value: 01/07/2014 12:20     Performed at [facility]   Culture     Final   Value:        BLOOD CULTURE RECEIVED NO GROWTH TO DATE CULTURE WILL BE HELD FOR 5 DAYS BEFORE ISSUING A FINAL NEGATIVE REPORT     Performed at  [facility]   Report Status PENDING   Incomplete  CULTURE, BLOOD (ROUTINE X 2)     Status: None   Collection Time    01/07/14  8:35 AM      Result Value Ref Range Status   Specimen Description BLOOD LEFT HAND   Final   Special Requests     Final   Value: BOTTLES DRAWN AEROBIC AND ANAEROBIC 10CC BLUE,5CC RED   Culture  Setup Time     Final   Value: 01/07/2014 12:20     Performed at [facility]   Culture     Final   Value:        BLOOD CULTURE RECEIVED NO GROWTH TO DATE CULTURE WILL BE HELD FOR 5 DAYS BEFORE ISSUING A FINAL NEGATIVE REPORT     Performed at [facility]   Report Status PENDING   Incomplete  URINE CULTURE     Status: None   Collection Time    01/07/14 11:10 AM      Result Value Ref Range Status   Specimen Description URINE, CLEAN CATCH   Final   Special Requests NONE   Final    Culture  Setup Time     Final   Value: 01/07/2014 12:17     Performed at [place]     Final   Value: >=100,000 COLONIES/ML     Performed at [facility]   Culture     Final   Value: GROUP B STREP(S.AGALACTIAE)ISOLATED     Note: TESTING AGAINST S. AGALACTIAE NOT ROUTINELY PERFORMED DUE TO PREDICTABILITY OF AMP/PEN/VAN SUSCEPTIBILITY.     Performed at [facility]   Report Status 01/08/2014 FINAL   Final     Studies: No results found.  Scheduled Meds: . azithromycin  500 mg Intravenous Q24H  . cefTRIAXone (ROCEPHIN)  IV  1 g Intravenous Q24H  . cholecalciferol  1,000 Units Oral Daily  . insulin aspart  0-5 Units Subcutaneous QHS  . insulin aspart  0-9 Units Subcutaneous TID WC  . lisinopril  5 mg Oral Daily  . pneumococcal 23 valent vaccine  0.5 mL Intramuscular Tomorrow-1000  . simvastatin  20 mg Oral q1800   Continuous Infusions:   Antibiotics Given (last 72 hours)   Date/Time Action Medication Dose Rate   01/07/14 1203 Given   azithromycin (ZITHROMAX) 500 mg in dextrose 5 % 250 mL IVPB 500 mg 250 mL/hr   01/07/14 1512 Given   cefTRIAXone (ROCEPHIN) 1 g in dextrose 5 % 50 mL IVPB 1 g 100 mL/hr   01/08/14 0951 Given   azithromycin (ZITHROMAX) 500 mg in dextrose 5 % 250 mL IVPB 500 mg 250 mL/hr   01/08/14 1112 Given   cefTRIAXone (ROCEPHIN) 1 g in dextrose 5 % 50 mL IVPB 1 g 100 mL/hr   01/09/14 0755 Given   azithromycin (ZITHROMAX) 500 mg in dextrose 5 % 250 mL IVPB 500 mg 250 mL/hr      Principal Problem:   Hemoptysis Active Problems:   DM (diabetes mellitus), type 2, uncontrolled   HTN (hypertension)   CAP (community acquired pneumonia)   Pulmonary hemorrhage   Hemoptysis, unspecified    Time spent: 60 min    [facility] Pager [phone]. If 7PM-7AM, please contact night-coverage at [email or web address], password [facility] 01/09/2014, 9:24 AM  LOS: 2 days

## 2014-01-09 NOTE — Progress Notes (Signed)
Utilization Review Completed.Brandy Lynn T Dowell5/25/2015  

## 2014-01-10 ENCOUNTER — Inpatient Hospital Stay (HOSPITAL_COMMUNITY): Payer: BC Managed Care – PPO

## 2014-01-10 DIAGNOSIS — R042 Hemoptysis: Secondary | ICD-10-CM

## 2014-01-10 LAB — CBC
HEMATOCRIT: 34.9 % — AB (ref 36.0–46.0)
HEMOGLOBIN: 11.9 g/dL — AB (ref 12.0–15.0)
MCH: 28.6 pg (ref 26.0–34.0)
MCHC: 34.1 g/dL (ref 30.0–36.0)
MCV: 83.9 fL (ref 78.0–100.0)
Platelets: 213 10*3/uL (ref 150–400)
RBC: 4.16 MIL/uL (ref 3.87–5.11)
RDW: 13.7 % (ref 11.5–15.5)
WBC: 5.6 10*3/uL (ref 4.0–10.5)

## 2014-01-10 LAB — ANA: Anti Nuclear Antibody(ANA): NEGATIVE

## 2014-01-10 LAB — OVA AND PARASITE EXAMINATION: Ova and parasites: NONE SEEN

## 2014-01-10 LAB — BASIC METABOLIC PANEL
BUN: 13 mg/dL (ref 6–23)
CO2: 25 mEq/L (ref 19–32)
CREATININE: 0.54 mg/dL (ref 0.50–1.10)
Calcium: 9.2 mg/dL (ref 8.4–10.5)
Chloride: 103 mEq/L (ref 96–112)
Glucose, Bld: 186 mg/dL — ABNORMAL HIGH (ref 70–99)
Potassium: 4.2 mEq/L (ref 3.7–5.3)
Sodium: 142 mEq/L (ref 137–147)

## 2014-01-10 LAB — MPO/PR-3 (ANCA) ANTIBODIES: Myeloperoxidase Abs: <1

## 2014-01-10 LAB — GLUCOSE, CAPILLARY
GLUCOSE-CAPILLARY: 186 mg/dL — AB (ref 70–99)
Glucose-Capillary: 197 mg/dL — ABNORMAL HIGH (ref 70–99)

## 2014-01-10 MED ORDER — METFORMIN HCL 500 MG PO TABS
1000.0000 mg | ORAL_TABLET | Freq: Two times a day (BID) | ORAL | Status: DC
  Filled 2014-01-10 (×2): qty 2

## 2014-01-10 MED ORDER — SITAGLIPTIN PHOSPHATE 100 MG PO TABS: 100.0000 mg | ORAL_TABLET | Freq: Every day | ORAL | Status: DC

## 2014-01-10 MED ORDER — LINAGLIPTIN 5 MG PO TABS
5.0000 mg | ORAL_TABLET | Freq: Every day | ORAL | Status: DC
  Administered 2014-01-10: 5 mg via ORAL
  Filled 2014-01-10: qty 1

## 2014-01-10 MED ORDER — GLIPIZIDE 10 MG PO TABS
10.0000 mg | ORAL_TABLET | Freq: Two times a day (BID) | ORAL | Status: DC
  Filled 2014-01-10 (×2): qty 1

## 2014-01-10 NOTE — Progress Notes (Signed)
01/10/2014 12:02 PM Nursing note Discharge avs form, medications already taken today and those due this evening given and explained to patient and family. Follow up appointments and when to call MD reviewed. D/c iv line. D/c tele. D/c home per orders.  [place]

## 2014-01-10 NOTE — Progress Notes (Signed)
Inpatient Diabetes Program Recommendations  AACE/ADA: New Consensus Statement on Inpatient Glycemic Control (2013)  Target Ranges:  Prepandial:   less than 140 mg/dL      Peak postprandial:   less than 180 mg/dL (1-2 hours)      Critically ill patients:  140 - 180 mg/dL  Results for RAINA, SOLE (MRN [id number]) as of 01/10/2014 09:22  Ref. Range 01/09/2014 05:51 01/09/2014 11:20 01/09/2014 14:29 01/09/2014 16:36 01/09/2014 20:38 01/10/2014 06:57  Glucose-Capillary Latest Range: 70-99 mg/dL 152 (H) 163 (H) 122 (H) 205 (H) 218 (H) 197 (H)    Inpatient Diabetes Program Recommendations Insulin - Basal: consider adding Lantus 10 units  Thank you  Raoul Pitch BSN, RN,CDE Inpatient Diabetes Coordinator [phone] (team pager)

## 2014-01-10 NOTE — Discharge Summary (Signed)
Physician Discharge Summary  Kareli Hossain DPO:[id number] DOB: 07/12/1966 DOA: 01/07/2014  PCP: Doug Sou B  Admit date: 01/07/2014 Discharge date: 01/10/2014  Time spent: 35 minutes  Recommendations for Outpatient Follow-up:  1. Avoid ACE and ASA  Discharge Diagnoses:  Principal Problem:   Hemoptysis Active Problems:   DM (diabetes mellitus), type 2, uncontrolled   HTN (hypertension)   CAP (community acquired pneumonia)   Pulmonary hemorrhage   Hemoptysis, unspecified   Discharge Condition: stable  Diet recommendation: cardiac/diabetic  Filed Weights   01/07/14 0142 01/07/14 0602  Weight: 87.091 kg (192 lb) 87.454 kg (192 lb 12.8 oz)    History of present illness:  Patient is a 48 year old female with history of type 2 diabetes mellitus, hypertension presented to med center [facility] ER with sudden hemoptysis this morning. History was obtained from the patient who reported that she was in her baseline health yesterday with no fevers, chills or any issues. This morning she woke up around midnight with coughing and moderate amount of hemoptysis, bright red blood and shortness of breath. Patient reported as a couple of episodes at home and then a couple in ER. She denied any chest pain, fevers chills. She had gone to [place] last week for 3 days otherwise no long-distance air flights. No recent sick contacts. Patient denied being sick recently, no travel outside the country, no night sweats or any weight loss. No rashes.    Hospital Course:  HEMOPTYSIS- Alveolar hemorrhage- Doubt endometriosis. Can't exclude Diphyllobothrium or similar migratory parasitic infection, given that she had a similar episode 1 year ago. Yield from bronchoscopy is likely to be low. This would be unusual result of common viral or bacterial pneumonias, without more of a prodrome.  Labs for vasculitis-ANCA pending, CRP 0.8, ESR 13  Otherwise follow conservatively, with CXR 5/26 with resolution of  infiltrates.  Recommend-  Stool for ova and parasites- pending D/c abx CXR 5/26 cleared ANCA(neg), sed rate(13), ANA(neg), RF(<10)-    Procedures:  none  Consultations:  pulm  Discharge Exam: Filed Vitals:   01/10/14 0411  BP: 136/67  Pulse: 78  Temp: 98.6 F (37 C)  Resp: 18    General: A+Ox3, NAd Cardiovascular: rrr Respiratory: clear  Discharge Instructions You were cared for by a hospitalist during your hospital stay. If you have any questions about your discharge medications or the care you received while you were in the hospital after you are discharged, you can call the unit and asked to speak with the hospitalist on call if the hospitalist that took care of you is not available. Once you are discharged, your primary care physician will handle any further medical issues. Please note that NO REFILLS for any discharge medications will be authorized once you are discharged, as it is imperative that you return to your primary care physician (or establish a relationship with a primary care physician if you do not have one) for your aftercare needs so that they can reassess your need for medications and monitor your lab values.      Discharge Instructions   Diet - low sodium heart healthy    Complete by:  As directed      Diet Carb Modified    Complete by:  As directed      Discharge instructions    Complete by:  As directed   No ASA     Increase activity slowly    Complete by:  As directed  Medication List    STOP taking these medications       aspirin 81 MG tablet     lisinopril 5 MG tablet  Commonly known as:  PRINIVIL,ZESTRIL      TAKE these medications       albuterol 108 (90 BASE) MCG/ACT inhaler  Commonly known as:  PROVENTIL HFA;VENTOLIN HFA  Inhale 2 puffs into the lungs every 6 (six) hours as needed for wheezing or shortness of breath.     cholecalciferol 1000 UNITS tablet  Commonly known as:  VITAMIN D  Take 1,000 Units by  mouth daily.     glipiZIDE 10 MG tablet  Commonly known as:  GLUCOTROL  Take 1 tablet (10 mg total) by mouth 2 (two) times daily before a meal.     metFORMIN 1000 MG tablet  Commonly known as:  GLUCOPHAGE  Take 1,000 mg by mouth 2 (two) times daily with a meal.     pioglitazone 15 MG tablet  Commonly known as:  ACTOS  Take 15 mg by mouth daily.     simvastatin 20 MG tablet  Commonly known as:  ZOCOR  Take 20 mg by mouth daily.     sitaGLIPtin 100 MG tablet  Commonly known as:  JANUVIA  Take 1 tablet (100 mg total) by mouth daily.       Allergies  Allergen Reactions  . Fish Allergy Other (See Comments)    Seafood, gets nausea/diarrhea   Follow-up Information   Follow up with Doug Sou B In 1 week.   Contact information:   [address] [place] [address] [phone]       Follow up with Deneise Lever, MD In 1 week.   Specialty:  Pulmonary Disease   Contact information:   [address]  [facility] [place] [address] [phone]        The results of significant diagnostics from this hospitalization (including imaging, microbiology, ancillary and laboratory) are listed below for reference.    Significant Diagnostic Studies: Dg Chest 2 View  01/10/2014   CLINICAL DATA:  Pulmonary hemorrhage  EXAM: CHEST  2 VIEW  COMPARISON:  01/07/2014  FINDINGS: Resolution of bilateral airspace disease. Lungs are now clear. No effusion. Heart size and vascularity is normal.  IMPRESSION: Resolution of bilateral infiltrates.   Electronically Signed   By: Franchot Gallo M.D.   On: 01/10/2014 08:05   Dg Chest 2 View  01/07/2014   CLINICAL DATA:  Hemoptysis, cough, and shortness of breath.  EXAM: CHEST  2 VIEW  COMPARISON:  12/30/2012  FINDINGS: There small vague areas of increased density in both upper lobes, nonspecific. Heart size and vascularity are normal. No effusions. No acute osseous abnormality.  IMPRESSION: Subtle areas of  increased density in both upper lobes which could represent small areas of infiltrate.   Electronically Signed   By: Rozetta Nunnery M.D.   On: 01/07/2014 02:25   Ct Angio Chest Pe W/cm &/or Wo Cm  01/07/2014   CLINICAL DATA:  Cough.  Hemoptysis.  Shortness of breath.  EXAM: CT ANGIOGRAPHY CHEST WITH CONTRAST  TECHNIQUE: Multidetector CT imaging of the chest was performed using the standard protocol during bolus administration of intravenous contrast. Multiplanar CT image reconstructions and MIPs were obtained to evaluate the vascular anatomy.  CONTRAST:  159m OMNIPAQUE IOHEXOL 350 MG/ML SOLN  COMPARISON:  Chest radiograph performed earlier today at 2 a.m., and CTA of the chest performed 01/30/2012  FINDINGS: There is  no evidence of pulmonary embolus.  Fluffy airspace opacification is noted within both lungs, raising concern for mild diffuse pulmonary alveolar hemorrhage, given the patient's hemoptysis. There is no evidence of pleural effusion or pneumothorax. No masses are identified; no abnormal focal contrast enhancement is seen.  The mediastinum is unremarkable in appearance. No mediastinal lymphadenopathy is seen. No pericardial effusion is identified. The great vessels are grossly unremarkable in appearance. No axillary lymphadenopathy is seen. The visualized portions of the thyroid gland are unremarkable in appearance.  The visualized portions of the liver and spleen are unremarkable.  No acute osseous abnormalities are seen.  Review of the MIP images confirms the above findings.  IMPRESSION: 1. No evidence of pulmonary embolus. 2. Fluffy bilateral airspace opacification, raising concern for mild diffuse pulmonary alveolar hemorrhage, given the patient's hemoptysis.   Electronically Signed   By: Garald Balding M.D.   On: 01/07/2014 03:47    Microbiology: Recent Results (from the past 240 hour(s))  CULTURE, BLOOD (ROUTINE X 2)     Status: None   Collection Time    01/07/14  8:30 AM      Result Value  Ref Range Status   Specimen Description BLOOD RIGHT HAND   Final   Special Requests BOTTLES DRAWN AEROBIC ONLY 10CC   Final   Culture  Setup Time     Final   Value: 01/07/2014 12:20     Performed at [facility]   Culture     Final   Value:        BLOOD CULTURE RECEIVED NO GROWTH TO DATE CULTURE WILL BE HELD FOR 5 DAYS BEFORE ISSUING A FINAL NEGATIVE REPORT     Performed at [facility]   Report Status PENDING   Incomplete  CULTURE, BLOOD (ROUTINE X 2)     Status: None   Collection Time    01/07/14  8:35 AM      Result Value Ref Range Status   Specimen Description BLOOD LEFT HAND   Final   Special Requests     Final   Value: BOTTLES DRAWN AEROBIC AND ANAEROBIC 10CC BLUE,5CC RED   Culture  Setup Time     Final   Value: 01/07/2014 12:20     Performed at [facility]   Culture     Final   Value:        BLOOD CULTURE RECEIVED NO GROWTH TO DATE CULTURE WILL BE HELD FOR 5 DAYS BEFORE ISSUING A FINAL NEGATIVE REPORT     Performed at [facility]   Report Status PENDING   Incomplete  URINE CULTURE     Status: None   Collection Time    01/07/14 11:10 AM      Result Value Ref Range Status   Specimen Description URINE, CLEAN CATCH   Final   Special Requests NONE   Final   Culture  Setup Time     Final   Value: 01/07/2014 12:17     Performed at [place]     Final   Value: >=100,000 COLONIES/ML     Performed at [facility]   Culture     Final   Value: GROUP B STREP(S.AGALACTIAE)ISOLATED     Note: TESTING AGAINST S. AGALACTIAE NOT ROUTINELY PERFORMED DUE TO PREDICTABILITY OF AMP/PEN/VAN SUSCEPTIBILITY.     Performed at [facility]   Report Status 01/08/2014 FINAL   Final     Labs: Basic Metabolic Panel:  Recent Labs Lab 01/07/14 0204 01/08/14 0435 01/09/14 0409 01/10/14 0405  NA 144 139 139 142  K 3.8 4.0 4.1 4.2  CL 102 102 103 103  CO2 29 26 27 25   GLUCOSE 184* 174* 172* 186*  BUN 9 10 12 13    CREATININE 0.70 0.52 0.50 0.54  CALCIUM 10.1 8.8 8.9 9.2   Liver Function Tests: No results found for this basename: AST, ALT, ALKPHOS, BILITOT, PROT, ALBUMIN,  in the last 168 hours No results found for this basename: LIPASE, AMYLASE,  in the last 168 hours No results found for this basename: AMMONIA,  in the last 168 hours CBC:  Recent Labs Lab 01/07/14 0204 01/08/14 0435 01/09/14 0409 01/10/14 0405  WBC 5.8 5.5 5.0 5.6  NEUTROABS 2.7  --   --   --   HGB 12.4 11.2* 10.2* 11.9*  HCT 35.8* 31.8* 30.3* 34.9*  MCV 86.1 83.9 84.4 83.9  PLT 243 205 190 213   Cardiac Enzymes:  Recent Labs Lab 01/07/14 0229  TROPONINI <0.30   BNP: BNP (last 3 results)  Recent Labs  01/07/14 0353  PROBNP 84.7   CBG:  Recent Labs Lab 01/09/14 1429 01/09/14 1636 01/09/14 2038 01/10/14 0657 01/10/14 1144  GLUCAP 122* 205* 218* 197* 186*       Signed:  Geradine Girt  [facility] 01/10/2014, 12:25 PM

## 2014-01-10 NOTE — Progress Notes (Signed)
   Name: Brandy Lynn MRN: [id number] DOB: [birth year]    ADMISSION DATE:  01/07/2014 CONSULTATION DATE: 5/24 REFERRING MD :  Dr Tana Coast PRIMARY SERVICE:  [facility]  CHIEF COMPLAINT:  Coughed blood  BRIEF PATIENT DESCRIPTION: 28 yoF never smoker, no ETOH, from [place] 18 years ago, diabetic. Otherwise in usual good health. Went to bed and woke smothering with bubbly sensation in upper chest, coughed up BRB. No fever or chest pain. CT [facility] neg for PE, showed diffuse fluffy infiltrates. Bleeding has slowed, less dyspnea. PCCM called for opinion. Similar episode May, 2014 resolved over 2 days, regarded as culture neg pneumonia.  SIGNIFICANT EVENTS / STUDIES:  CT chest 5/23  LINES / TUBES: PIV  CULTURES: Influenza antigen 5/23 > Neg Urine 5/23> Pos Grop B Strep 100k Urine legionella 5/23 > neg Urine strep 5/23 > neg Blood x 2 5/23>    ANTIBIOTICS: Roceph 5/23>> Zith 5/23>>     SUBJECTIVE:   No more significant hemoptysis since Saturday 5/23.  VITAL SIGNS: Temp:  [98.5 F (36.9 C)-98.6 F (37 C)] 98.6 F (37 C) (05/26 0411) Pulse Rate:  [77-85] 78 (05/26 0411) Resp:  [18-20] 18 (05/26 0411) BP: (133-142)/(64-79) 136/67 mmHg (05/26 0411) SpO2:  [99 %-100 %] 100 % (05/26 0411) FIO2   RA   PHYSICAL EXAMINATION: General:  Pleasant AAF, NAD, cooperative, accent thick.  Neuro:  Non-focal, oriented x 4, ambulatory HEENT:  oroph clear Neck:  No stridor,  Cardiovascular:  RRR, NlS1S2, no murmur or rub Lungs:  Quiet, unlabored, no cough, rales,rhonchi or rub Abdomen: soft, no HSM Musculoskeletal:  Nl muscle bulk and apparent strength Skin:  Clear    Recent Labs Lab 01/08/14 0435 01/09/14 0409 01/10/14 0405  NA 139 139 142  K 4.0 4.1 4.2  CL 102 103 103  CO2 $Re'26 27 25  'dCu$ BUN $R'10 12 13  'CW$ CREATININE 0.52 0.50 0.54  GLUCOSE 174* 172* 186*    Recent Labs Lab 01/08/14 0435 01/09/14 0409 01/10/14 0405  HGB 11.2* 10.2* 11.9*  HCT 31.8* 30.3* 34.9*  WBC 5.5  5.0 5.6  PLT 205 190 213   Dg Chest 2 View  01/10/2014   CLINICAL DATA:  Pulmonary hemorrhage  EXAM: CHEST  2 VIEW  COMPARISON:  01/07/2014  FINDINGS: Resolution of bilateral airspace disease. Lungs are now clear. No effusion. Heart size and vascularity is normal.  IMPRESSION: Resolution of bilateral infiltrates.   Electronically Signed   By: Franchot Gallo M.D.   On: 01/10/2014 08:05    ASSESSMENT / PLAN:  HEMOPTYSIS- Alveolar hemorrhage- Doubt endometriosis. Can't exclude Diphyllobothrium or similar migratory parasitic infection, given that she had a similar episode 1 year ago. Yield from bronchoscopy is likely to be low. This would be unusual result of common viral or bacterial pneumonias, without more of a prodrome. Labs for vasculitis-ANCA pending, CRP 0.8, ESR 13 Otherwise follow conservatively, with CXR 5/26 with resolution of infiltrates.  Recommend-  Stool for ova and parasites- pending collection Ok to continue current antibiotics empirically CXR 5/26 as noted ANCA(neg), sed rate(13), ANA(neg), RF(<10)-     .  She should not be taking asa in any form.  PCCM will sign off.   Richardson Landry Sylvester Salonga ACNP Maryanna Shape PCCM Pager [phone] till 3 pm If no answer page [phone] 01/10/2014, 8:56 AM

## 2014-01-11 ENCOUNTER — Telehealth: Payer: Self-pay | Admitting: Internal Medicine

## 2014-01-11 NOTE — Telephone Encounter (Signed)
Spoke with patient-she is aware that I have scheduled her with TP on Tuesday 01-17-14 at 3pm and TP will get her set up with a pulmonary MD after that. Does not have to be CY.

## 2014-01-13 LAB — CULTURE, BLOOD (ROUTINE X 2)
Culture: NO GROWTH
Culture: NO GROWTH

## 2014-01-17 ENCOUNTER — Inpatient Hospital Stay: Payer: BC Managed Care – PPO | Admitting: Adult Health

## 2014-01-24 ENCOUNTER — Inpatient Hospital Stay: Payer: BC Managed Care – PPO | Admitting: Adult Health

## 2014-01-30 ENCOUNTER — Encounter: Payer: Self-pay | Admitting: Adult Health

## 2014-01-30 ENCOUNTER — Ambulatory Visit (INDEPENDENT_AMBULATORY_CARE_PROVIDER_SITE_OTHER): Payer: BC Managed Care – PPO | Admitting: Adult Health

## 2014-01-30 ENCOUNTER — Ambulatory Visit (INDEPENDENT_AMBULATORY_CARE_PROVIDER_SITE_OTHER)
Admission: RE | Admit: 2014-01-30 | Discharge: 2014-01-30 | Disposition: A | Discharge status: Home / Self Care | Payer: BC Managed Care – PPO | Source: Ambulatory Visit | Attending: Adult Health | Admitting: Adult Health

## 2014-01-30 VITALS — BP 128/74 | HR 99 | Temp 98.2°F | Ht 64.0 in | Wt 195.2 lb

## 2014-01-30 DIAGNOSIS — R042 Hemoptysis: Secondary | ICD-10-CM

## 2014-01-30 DIAGNOSIS — J189 Pneumonia, unspecified organism: Secondary | ICD-10-CM

## 2014-01-30 NOTE — Progress Notes (Signed)
   Subjective:    Patient ID: Brandy Lynn, female    DOB: 1966-06-30, 48 y.o.   MRN: [id number]  HPI 48 year old female seen for initial pulmonary consult during hospitalization May 25th,  2015 for hemoptysis. Pt is from [place] ([place] 2006)   01/30/2014 [facility] follow  Patient returns for a post hospital followup. Patient was admitted May 23-26  hemoptysis and pulmonary infiltrates on CT. Workup for possible underlying vasculitis was unrevealing, with a negative, ANCA , CRP at 0.8, sedimentation rate at 13, negative, rheumatoid factor, negative, ANA. Patient was treated with IV antibiotics. Stool for O&P was negative. HIV neg  Patient was treated with IV antibiotics. Chest x-ray showed resolution of pulmonary infiltrates. Since discharge. She is feeling improved with decreased cough and shortness of breath. She has had no further episodes of hemoptysis. Denies any wheezing, cough, dyspnea, PND, hemoptysis, nausea, vomiting. Patient is a never smoker. She is a Theme park manager. She has no unusual hobbies or pets  Review of Systems Constitutional:   No  weight loss, night sweats,  Fevers, chills, fatigue, or  lassitude.  HEENT:   No headaches,  Difficulty swallowing,  Tooth/dental problems, or  Sore throat,                No sneezing, itching, ear ache, nasal congestion, post nasal drip,   CV:  No chest pain,  Orthopnea, PND, swelling in lower extremities, anasarca, dizziness, palpitations, syncope.   GI  No heartburn, indigestion, abdominal pain, nausea, vomiting, diarrhea, change in bowel habits, loss of appetite, bloody stools.   Resp: No shortness of breath with exertion or at rest.  No excess mucus, no productive cough,  No non-productive cough,  No coughing up of blood.  No change in color of mucus.  No wheezing.  No chest wall deformity  Skin: no rash or lesions.  GU: no dysuria, change in color of urine, no urgency or frequency.  No flank pain, no hematuria   MS:  No joint  pain or swelling.  No decreased range of motion.  No back pain.  Psych:  No change in mood or affect. No depression or anxiety.  No memory loss.          Objective:   Physical Exam GEN: A/Ox3; pleasant , NAD, well nourished   HEENT:  [place]/AT,  EACs-clear, TMs-wnl, NOSE-clear, THROAT-clear, no lesions, no postnasal drip or exudate noted.   NECK:  Supple w/ fair ROM; no JVD; normal carotid impulses w/o bruits; no thyromegaly or nodules palpated; no lymphadenopathy.  RESP  Clear  P & A; w/o, wheezes/ rales/ or rhonchi.no accessory muscle use, no dullness to percussion  CARD:  RRR, no m/r/g  , no peripheral edema, pulses intact, no cyanosis or clubbing.  GI:   Soft & nt; nml bowel sounds; no organomegaly or masses detected.  Musco: Warm bil, no deformities or joint swelling noted.   Neuro: alert, no focal deficits noted.    Skin: Warm, no lesions or rashes   CXR 01/30/2014  The lungs appear clear. Cardiac and mediastinal contours normal.  No pleural effusion identified.      Assessment & Plan:

## 2014-01-30 NOTE — Assessment & Plan Note (Signed)
Hemoptysis with associated CAP  Resolved with ABX  Autoimmune/Vasculitis workup unrevealing w/ neg ANCA /ANA/RA Factor/CRP HIV neg.  No reoccurence  And cxr remains clear.  Pt to continue on current regimen  follow up with PCP  Case discussed with Dr. Annamaria Boots  And follow up with Pulmonary As needed

## 2014-01-30 NOTE — Patient Instructions (Signed)
Your xray has cleared completely .  You may follow up with pulmonary office as needed.  Continue with follow up with Primary office

## 2014-01-30 NOTE — Assessment & Plan Note (Signed)
Clinically improved with antibiotics. Chest x-ray has cleared, with no residual pulmonary infiltrates.  Pneumovax was given during her hospital stay. Patient is to followup with her family doctor as planned. Patient will followup with pulmonary on as-needed basis.

## 2014-04-17 ENCOUNTER — Encounter (HOSPITAL_BASED_OUTPATIENT_CLINIC_OR_DEPARTMENT_OTHER): Payer: Self-pay | Admitting: Emergency Medicine

## 2014-04-17 ENCOUNTER — Inpatient Hospital Stay (HOSPITAL_BASED_OUTPATIENT_CLINIC_OR_DEPARTMENT_OTHER)
Admission: EM | Admit: 2014-04-17 | Discharge: 2014-04-20 | DRG: 639 | Disposition: A | Discharge status: Home / Self Care | Payer: BC Managed Care – PPO | Attending: Internal Medicine | Admitting: Internal Medicine

## 2014-04-17 DIAGNOSIS — Z6835 Body mass index (BMI) 35.0-35.9, adult: Secondary | ICD-10-CM

## 2014-04-17 DIAGNOSIS — J45909 Unspecified asthma, uncomplicated: Secondary | ICD-10-CM | POA: Diagnosis present

## 2014-04-17 DIAGNOSIS — E1165 Type 2 diabetes mellitus with hyperglycemia: Secondary | ICD-10-CM

## 2014-04-17 DIAGNOSIS — E559 Vitamin D deficiency, unspecified: Secondary | ICD-10-CM | POA: Diagnosis present

## 2014-04-17 DIAGNOSIS — Z9119 Patient's noncompliance with other medical treatment and regimen: Secondary | ICD-10-CM

## 2014-04-17 DIAGNOSIS — IMO0002 Reserved for concepts with insufficient information to code with codable children: Secondary | ICD-10-CM

## 2014-04-17 DIAGNOSIS — Z91199 Patient's noncompliance with other medical treatment and regimen due to unspecified reason: Secondary | ICD-10-CM | POA: Diagnosis not present

## 2014-04-17 DIAGNOSIS — M109 Gout, unspecified: Secondary | ICD-10-CM | POA: Diagnosis present

## 2014-04-17 DIAGNOSIS — Z5987 Material hardship due to limited financial resources, not elsewhere classified: Secondary | ICD-10-CM

## 2014-04-17 DIAGNOSIS — E119 Type 2 diabetes mellitus without complications: Secondary | ICD-10-CM | POA: Diagnosis present

## 2014-04-17 DIAGNOSIS — E1100 Type 2 diabetes mellitus with hyperosmolarity without nonketotic hyperglycemic-hyperosmolar coma (NKHHC): Secondary | ICD-10-CM | POA: Diagnosis present

## 2014-04-17 DIAGNOSIS — E785 Hyperlipidemia, unspecified: Secondary | ICD-10-CM | POA: Diagnosis present

## 2014-04-17 DIAGNOSIS — L293 Anogenital pruritus, unspecified: Secondary | ICD-10-CM | POA: Diagnosis present

## 2014-04-17 DIAGNOSIS — E669 Obesity, unspecified: Secondary | ICD-10-CM | POA: Diagnosis present

## 2014-04-17 DIAGNOSIS — Z598 Other problems related to housing and economic circumstances: Secondary | ICD-10-CM

## 2014-04-17 DIAGNOSIS — R739 Hyperglycemia, unspecified: Secondary | ICD-10-CM

## 2014-04-17 DIAGNOSIS — Z79899 Other long term (current) drug therapy: Secondary | ICD-10-CM | POA: Diagnosis not present

## 2014-04-17 DIAGNOSIS — R631 Polydipsia: Secondary | ICD-10-CM | POA: Diagnosis present

## 2014-04-17 DIAGNOSIS — I10 Essential (primary) hypertension: Secondary | ICD-10-CM | POA: Diagnosis present

## 2014-04-17 LAB — URINE MICROSCOPIC-ADD ON

## 2014-04-17 LAB — BASIC METABOLIC PANEL
Anion gap: 13 (ref 5–15)
BUN: 11 mg/dL (ref 6–23)
CHLORIDE: 97 meq/L (ref 96–112)
CO2: 29 mEq/L (ref 19–32)
Calcium: 10.3 mg/dL (ref 8.4–10.5)
Creatinine, Ser: 0.9 mg/dL (ref 0.50–1.10)
GFR calc non Af Amer: 74 mL/min — ABNORMAL LOW (ref >90)
GFR, EST AFRICAN AMERICAN: 86 mL/min — AB (ref >90)
GLUCOSE: 669 mg/dL — AB (ref 70–99)
Potassium: 4.3 mEq/L (ref 3.7–5.3)
Sodium: 139 mEq/L (ref 137–147)

## 2014-04-17 LAB — CBG MONITORING, ED
Glucose-Capillary: 455 mg/dL — ABNORMAL HIGH (ref 70–99)
Glucose-Capillary: >600 mg/dL (ref 70–99)

## 2014-04-17 LAB — URINALYSIS, ROUTINE W REFLEX MICROSCOPIC
Bilirubin Urine: NEGATIVE
Glucose, UA: >1000 mg/dL — AB
HGB URINE DIPSTICK: NEGATIVE
Ketones, ur: NEGATIVE mg/dL
Leukocytes, UA: NEGATIVE
Nitrite: NEGATIVE
PROTEIN: NEGATIVE mg/dL
Specific Gravity, Urine: 1.026 (ref 1.005–1.030)
UROBILINOGEN UA: 0.2 mg/dL (ref 0.0–1.0)
pH: 6.5 (ref 5.0–8.0)

## 2014-04-17 LAB — CBC WITH DIFFERENTIAL/PLATELET
BASOS PCT: 0 % (ref 0–1)
Basophils Absolute: 0 10*3/uL (ref 0.0–0.1)
EOS ABS: 0.1 10*3/uL (ref 0.0–0.7)
Eosinophils Relative: 2 % (ref 0–5)
HCT: 35.4 % — ABNORMAL LOW (ref 36.0–46.0)
Hemoglobin: 12.1 g/dL (ref 12.0–15.0)
Lymphocytes Relative: 38 % (ref 12–46)
Lymphs Abs: 2.1 10*3/uL (ref 0.7–4.0)
MCH: 28.5 pg (ref 26.0–34.0)
MCHC: 34.2 g/dL (ref 30.0–36.0)
MCV: 83.5 fL (ref 78.0–100.0)
Monocytes Absolute: 0.5 10*3/uL (ref 0.1–1.0)
Monocytes Relative: 8 % (ref 3–12)
NEUTROS PCT: 52 % (ref 43–77)
Neutro Abs: 2.8 10*3/uL (ref 1.7–7.7)
Platelets: 165 10*3/uL (ref 150–400)
RBC: 4.24 MIL/uL (ref 3.87–5.11)
RDW: 13.2 % (ref 11.5–15.5)
WBC: 5.5 10*3/uL (ref 4.0–10.5)

## 2014-04-17 MED ORDER — SODIUM CHLORIDE 0.9 % IV SOLN
1000.0000 mL | Freq: Once | INTRAVENOUS | Status: AC
  Administered 2014-04-17: 1000 mL via INTRAVENOUS

## 2014-04-17 MED ORDER — SODIUM CHLORIDE 0.9 % IV SOLN
INTRAVENOUS | Status: DC
  Administered 2014-04-18 (×2): via INTRAVENOUS

## 2014-04-17 MED ORDER — DEXTROSE-NACL 5-0.45 % IV SOLN
INTRAVENOUS | Status: DC
  Administered 2014-04-18: 05:00:00 via INTRAVENOUS

## 2014-04-17 MED ORDER — SODIUM CHLORIDE 0.9 % IV SOLN
INTRAVENOUS | Status: DC
  Administered 2014-04-18: 4 [IU]/h via INTRAVENOUS

## 2014-04-17 MED ORDER — SODIUM CHLORIDE 0.9 % IV SOLN: INTRAVENOUS | Status: DC

## 2014-04-17 MED ORDER — DEXTROSE 50 % IV SOLN: 25.0000 mL | INTRAVENOUS | Status: DC | PRN

## 2014-04-17 MED ORDER — INSULIN REGULAR HUMAN 100 UNIT/ML IJ SOLN
10.0000 [IU] | Freq: Once | INTRAMUSCULAR | Status: DC
  Filled 2014-04-17: qty 1

## 2014-04-17 MED ORDER — INSULIN REGULAR BOLUS VIA INFUSION
0.0000 [IU] | Freq: Three times a day (TID) | INTRAVENOUS | Status: DC
  Filled 2014-04-17: qty 10

## 2014-04-17 NOTE — ED Notes (Signed)
Pt c/o neck pain. States she generally feels bad. States she checked her sugar and it was 600. States she has frequent urination.

## 2014-04-17 NOTE — ED Provider Notes (Signed)
CSN: [id number]     Arrival date & time 04/17/14  2012 History  This chart was scribed for Dot Lanes, MD by Erling Conte, ED Scribe. This patient was seen in room MH07/MH07 and the patient's care was started at 9:23 PM.    Chief Complaint  Patient presents with  . Hyperglycemia      The history is provided by the patient. No language interpreter was used.   HPI Comments: Brandy Lynn is a 48 y.o. female with a h/o type II DM, HTN, obesity, podagra, and hemoptysis who presents to the Emergency Department complaining of a hyperglycemic state. She states she checked her BS at home and it was 600. She takes Metformin and glipizide for management of her DM. She has been taking these medications regularly this week. She admits that there is one other medication she is supposed to be taking for her DM but she has ran out of it. Pt is unable to recall the name of this medication. She admits that this week she has been having frequent urination and frequent thirst for 3 days. She is also complaining of HA, neck pain, and dizziness that began yesterday. Pt states she just generally feels bad. She denies any syncope, nausea, SOB, emesis, or confusion.   Dr. Doug Sou.   Past Medical History  Diagnosis Date  . T2DM (type 2 diabetes mellitus)   . HTN (hypertension) 11/12/2012  . Obesity 12/09/2010  . 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 NSAID and low-protein diet. Uric acid and CBC today.    . Hemoptysis, unspecified 12/2012, 12/2013   Past Surgical History  Procedure Laterality Date  . Tubal ligation     Family History  Problem Relation Age of Onset  . Diabetes Father   . Diabetes Sister   . Diabetes Brother   . Breast cancer     History  Substance Use Topics  . Smoking status: Never Smoker   . Smokeless tobacco: Not on file  . Alcohol Use: No   OB History   Grav Para Term Preterm  Abortions TAB SAB Ect Mult Living                 Review of Systems  Respiratory: Negative for shortness of breath.   Gastrointestinal: Negative for nausea and vomiting.  Endocrine: Positive for polydipsia and polyuria.  Genitourinary: Positive for frequency.  Musculoskeletal: Positive for neck pain.  Neurological: Positive for dizziness and headaches. Negative for syncope.  Psychiatric/Behavioral: Negative for confusion.  A complete 10 system review of systems was obtained and all systems are negative except as noted in the HPI and PMH.      Allergies  Fish allergy  Home Medications   Prior to Admission medications   Medication Sig Start Date End Date Taking? Authorizing Provider  cholecalciferol (VITAMIN D) 1000 UNITS tablet Take 1,000 Units by mouth daily.   Yes Historical Provider, MD  glipiZIDE (GLUCOTROL) 10 MG tablet Take 1 tablet (10 mg total) by mouth 2 (two) times daily before a meal. 09/08/13  Yes Zigmund Gottron, MD  metFORMIN (GLUCOPHAGE) 1000 MG tablet Take 1,000 mg by mouth 2 (two) times daily with a meal.   Yes Historical Provider, MD  simvastatin (ZOCOR) 20 MG tablet Take 20 mg by mouth daily.   Yes Historical Provider, MD  albuterol (PROVENTIL HFA;VENTOLIN HFA) 108 (90 BASE) MCG/ACT inhaler Inhale 2 puffs into the  lungs every 6 (six) hours as needed for wheezing or shortness of breath. 10/18/13   Lupita Dawn, MD  pioglitazone (ACTOS) 15 MG tablet Take 15 mg by mouth daily.  12/26/13   Historical Provider, MD  sitaGLIPtin (JANUVIA) 100 MG tablet Take 1 tablet (100 mg total) by mouth daily. 01/10/14   Geradine Girt, DO   Triage Vitals: BP 179/80  Pulse 93  Temp(Src) 98.6 F (37 C) (Oral)  Resp 18  Wt 190 lb (86.183 kg)  SpO2 100%  Physical Exam Physical Exam  Nursing note and vitals reviewed. Constitutional: She is oriented to person, place, and time. She appears well-developed and well-nourished.  He appeared ill  HENT:  Mouth: Because membranes are  dry Head: Normocephalic and atraumatic.  Eyes: Pupils are equal, round, and reactive to light.  Neck: Normal range of motion.  Cardiovascular: Normal rate and intact distal pulses.   Pulmonary/Chest: No respiratory distress.  Abdominal: Normal appearance. She exhibits no distension.  Musculoskeletal: Normal range of motion.  Neurological: She is alert and oriented to person, place, and time. No cranial nerve deficit.  Skin: Skin is warm and dry. No rash noted.  Psychiatric: She has a normal mood and affect. Her behavior is normal.   ED Course  Procedures (including critical care time) Medications  insulin regular bolus via infusion 0-10 Units (not administered)  insulin regular (NOVOLIN R,HUMULIN R) 250 Units in sodium chloride 0.9 % 250 mL (1 Units/mL) infusion (not administered)  dextrose 50 % solution 25 mL (not administered)  0.9 %  sodium chloride infusion (not administered)  dextrose 5 %-0.45 % sodium chloride infusion (not administered)  0.9 %  sodium chloride infusion (1,000 mLs Intravenous New Bag/Given 04/17/14 2146)    Followed by  0.9 %  sodium chloride infusion (1,000 mLs Intravenous New Bag/Given 04/17/14 2145)   CRITICAL CARE Performed by: Leonard Schwartz L Total critical care time: 45 min Critical care time was exclusive of separately billable procedures and treating other patients. Critical care was necessary to treat or prevent imminent or life-threatening deterioration. Critical care was time spent personally by me on the following activities: development of treatment plan with patient and/or surrogate as well as nursing, discussions with consultants, evaluation of patient's response to treatment, examination of patient, obtaining history from patient or surrogate, ordering and performing treatments and interventions, ordering and review of laboratory studies, ordering and review of radiographic studies, pulse oximetry and re-evaluation of patient's  condition.    DIAGNOSTIC STUDIES: Oxygen Saturation is 100% on RA, normal by my interpretation.    COORDINATION OF CARE:    Labs Review Labs Reviewed  CBC WITH DIFFERENTIAL - Abnormal; Notable for the following:    HCT 35.4 (*)    All other components within normal limits  URINALYSIS, ROUTINE W REFLEX MICROSCOPIC - Abnormal; Notable for the following:    Glucose, UA >1000 (*)    All other components within normal limits  BASIC METABOLIC PANEL - Abnormal; Notable for the following:    Glucose, Bld 669 (*)    GFR calc non Af Amer 74 (*)    GFR calc Af Amer 86 (*)    All other components within normal limits  CBG MONITORING, ED - Abnormal; Notable for the following:    Glucose-Capillary >600 (*)    All other components within normal limits  URINE MICROSCOPIC-ADD ON  CBC    Imaging Review No results found.    MDM   Final diagnoses:  Uncontrolled diabetes mellitus  I personally performed the services described in this documentation, which was scribed in my presence. The recorded information has been reviewed and considered.     Dot Lanes, MD 04/17/14 [phone]

## 2014-04-17 NOTE — Plan of Care (Signed)
48 yo F with DM2, was prescribed Jauniva by her doctor in [place] (no longer sees [facility] clinic apparently), to go along with her metformin and glipizide for uncontrolled DM a couple of weeks ago, but ran out of samples of this and her insurance wont cover it so she stopped taking it.  DM became uncontrolled again on just metformin and glipizide with BGL in the 600s today.  Presents to ED at [facility], hyperglycemia without DKA.  Given 10 of insulin, if no effect told em to put her on insulin gtt and admit to med-surg for hyperglycemia.

## 2014-04-18 DIAGNOSIS — E1165 Type 2 diabetes mellitus with hyperglycemia: Secondary | ICD-10-CM

## 2014-04-18 DIAGNOSIS — IMO0001 Reserved for inherently not codable concepts without codable children: Secondary | ICD-10-CM

## 2014-04-18 DIAGNOSIS — E785 Hyperlipidemia, unspecified: Secondary | ICD-10-CM

## 2014-04-18 DIAGNOSIS — E1101 Type 2 diabetes mellitus with hyperosmolarity with coma: Secondary | ICD-10-CM

## 2014-04-18 DIAGNOSIS — R7309 Other abnormal glucose: Secondary | ICD-10-CM

## 2014-04-18 LAB — BASIC METABOLIC PANEL
ANION GAP: 11 (ref 5–15)
BUN: 8 mg/dL (ref 6–23)
CO2: 27 meq/L (ref 19–32)
Calcium: 8.7 mg/dL (ref 8.4–10.5)
Chloride: 109 mEq/L (ref 96–112)
Creatinine, Ser: 0.51 mg/dL (ref 0.50–1.10)
GFR calc Af Amer: >90 mL/min (ref >90)
GFR calc non Af Amer: >90 mL/min (ref >90)
Glucose, Bld: 182 mg/dL — ABNORMAL HIGH (ref 70–99)
POTASSIUM: 3.6 meq/L — AB (ref 3.7–5.3)
SODIUM: 147 meq/L (ref 137–147)

## 2014-04-18 LAB — GLUCOSE, CAPILLARY
GLUCOSE-CAPILLARY: 349 mg/dL — AB (ref 70–99)
GLUCOSE-CAPILLARY: 143 mg/dL — AB (ref 70–99)
GLUCOSE-CAPILLARY: 183 mg/dL — AB (ref 70–99)
GLUCOSE-CAPILLARY: 296 mg/dL — AB (ref 70–99)
GLUCOSE-CAPILLARY: 392 mg/dL — AB (ref 70–99)
Glucose-Capillary: 422 mg/dL — ABNORMAL HIGH (ref 70–99)
Glucose-Capillary: 211 mg/dL — ABNORMAL HIGH (ref 70–99)
Glucose-Capillary: 191 mg/dL — ABNORMAL HIGH (ref 70–99)
Glucose-Capillary: 128 mg/dL — ABNORMAL HIGH (ref 70–99)
Glucose-Capillary: 205 mg/dL — ABNORMAL HIGH (ref 70–99)
Glucose-Capillary: 230 mg/dL — ABNORMAL HIGH (ref 70–99)

## 2014-04-18 LAB — CBC
HCT: 30.9 % — ABNORMAL LOW (ref 36.0–46.0)
HEMOGLOBIN: 11.1 g/dL — AB (ref 12.0–15.0)
MCH: 29.1 pg (ref 26.0–34.0)
MCHC: 35.9 g/dL (ref 30.0–36.0)
MCV: 80.9 fL (ref 78.0–100.0)
Platelets: 156 10*3/uL (ref 150–400)
RBC: 3.82 MIL/uL — ABNORMAL LOW (ref 3.87–5.11)
RDW: 13.1 % (ref 11.5–15.5)
WBC: 5.1 10*3/uL (ref 4.0–10.5)

## 2014-04-18 LAB — HEMOGLOBIN A1C
HEMOGLOBIN A1C: 10.1 % — AB (ref <5.7)
Mean Plasma Glucose: 243 mg/dL — ABNORMAL HIGH (ref <117)

## 2014-04-18 MED ORDER — IBUPROFEN 600 MG PO TABS
600.0000 mg | ORAL_TABLET | Freq: Four times a day (QID) | ORAL | Status: DC | PRN
  Administered 2014-04-18: 600 mg via ORAL
  Filled 2014-04-18 (×3): qty 1

## 2014-04-18 MED ORDER — LIVING WELL WITH DIABETES BOOK
Freq: Once | Status: AC
  Administered 2014-04-18: 1
  Filled 2014-04-18: qty 1

## 2014-04-18 MED ORDER — ONDANSETRON HCL 4 MG/2ML IJ SOLN: 4.0000 mg | Freq: Three times a day (TID) | INTRAMUSCULAR | Status: AC | PRN

## 2014-04-18 MED ORDER — KETOROLAC TROMETHAMINE 30 MG/ML IJ SOLN
30.0000 mg | Freq: Four times a day (QID) | INTRAMUSCULAR | Status: DC | PRN
  Administered 2014-04-18 (×2): 30 mg via INTRAVENOUS
  Filled 2014-04-18 (×2): qty 1

## 2014-04-18 MED ORDER — ACETAMINOPHEN 325 MG PO TABS
650.0000 mg | ORAL_TABLET | Freq: Four times a day (QID) | ORAL | Status: DC | PRN
  Administered 2014-04-18 – 2014-04-19 (×2): 650 mg via ORAL
  Filled 2014-04-18 (×2): qty 2

## 2014-04-18 MED ORDER — VITAMIN D3 25 MCG (1000 UNIT) PO TABS
1000.0000 [IU] | ORAL_TABLET | Freq: Every day | ORAL | Status: DC
  Administered 2014-04-18 – 2014-04-20 (×3): 1000 [IU] via ORAL
  Filled 2014-04-18 (×3): qty 1

## 2014-04-18 MED ORDER — INSULIN GLARGINE 100 UNIT/ML ~~LOC~~ SOLN
30.0000 [IU] | Freq: Every day | SUBCUTANEOUS | Status: DC
  Administered 2014-04-18 – 2014-04-19 (×2): 30 [IU] via SUBCUTANEOUS
  Filled 2014-04-18 (×3): qty 0.3

## 2014-04-18 MED ORDER — HEPARIN SODIUM (PORCINE) 5000 UNIT/ML IJ SOLN
5000.0000 [IU] | Freq: Three times a day (TID) | INTRAMUSCULAR | Status: DC
  Administered 2014-04-18 – 2014-04-20 (×7): 5000 [IU] via SUBCUTANEOUS
  Filled 2014-04-18 (×10): qty 1

## 2014-04-18 MED ORDER — ALBUTEROL SULFATE (2.5 MG/3ML) 0.083% IN NEBU: 2.5000 mg | INHALATION_SOLUTION | RESPIRATORY_TRACT | Status: DC | PRN

## 2014-04-18 MED ORDER — INSULIN ASPART 100 UNIT/ML ~~LOC~~ SOLN
0.0000 [IU] | Freq: Three times a day (TID) | SUBCUTANEOUS | Status: DC
  Administered 2014-04-18: 15 [IU] via SUBCUTANEOUS
  Administered 2014-04-18: 8 [IU] via SUBCUTANEOUS
  Administered 2014-04-19: 15 [IU] via SUBCUTANEOUS
  Administered 2014-04-19: 11 [IU] via SUBCUTANEOUS
  Administered 2014-04-19: 3 [IU] via SUBCUTANEOUS
  Administered 2014-04-20: 11 [IU] via SUBCUTANEOUS
  Administered 2014-04-20: 5 [IU] via SUBCUTANEOUS

## 2014-04-18 MED ORDER — SIMVASTATIN 20 MG PO TABS
20.0000 mg | ORAL_TABLET | Freq: Every day | ORAL | Status: DC
  Administered 2014-04-18 – 2014-04-20 (×3): 20 mg via ORAL
  Filled 2014-04-18 (×3): qty 1

## 2014-04-18 MED ORDER — INSULIN STARTER KIT- SYRINGES (ENGLISH)
1.0000 | Freq: Once | Status: AC
  Administered 2014-04-18: 1
  Filled 2014-04-18: qty 1

## 2014-04-18 MED ORDER — INSULIN ASPART 100 UNIT/ML ~~LOC~~ SOLN
4.0000 [IU] | Freq: Three times a day (TID) | SUBCUTANEOUS | Status: DC
  Administered 2014-04-18 – 2014-04-19 (×3): 4 [IU] via SUBCUTANEOUS

## 2014-04-18 MED ORDER — GLUCERNA SHAKE PO LIQD
237.0000 mL | Freq: Two times a day (BID) | ORAL | Status: DC
  Administered 2014-04-18 – 2014-04-20 (×4): 237 mL via ORAL

## 2014-04-18 NOTE — Progress Notes (Signed)
Inpatient Diabetes Program Recommendations  AACE/ADA: New Consensus Statement on Inpatient Glycemic Control (2013)  Target Ranges:  Prepandial:   less than 140 mg/dL      Peak postprandial:   less than 180 mg/dL (1-2 hours)      Critically ill patients:  140 - 180 mg/dL   Reason for Visit: Diabetes Coordinator Consult Diabetes history: Prior history of gestational diabetes, Type 2 diabetes Outpatient Diabetes medications: Glucotrol 10 mg bid, Metformin 1000 bid. Actos 15 mg daily on med rec, but patient states not taking Current orders for Inpatient glycemic control: Lantus 30 units daily, Novolog 4 units tid with meals, Novolog correction moderate scale tid  Note:  Patient states her PCP ordered Levemir pens for her, pharmacist instructed her re use, but this found out they would cost >$200.  Tried to look up coverage for Levemir, Lantus, and Novolog in computer and some numbers beside listing indicate no coverage and other numbers indicate Level 1 perferred for each insulin-- as vials and pens.  Question accuracy of information in the database.   Have a coupon for a free box of Lantus pens and a free box of Humalog pens in office-- but not sure if patient will continue to be able to get after she has used the free supply.  Patient took ReliOn 70/30 insulin ($24.88/vial from [facility]) when she had gestational diabetes.  States she could afford to get the 70/30 insulin.  Reminded patient that 70/30 is a mixture of two insulins, to be taken with breakfast and supper at regularly scheduled times, and she will need to possibly eat a small snack between meals and a bedtime snack.  Have asked nursing staff to instruct/review insulin preparation and administration, s/s and treatment of hypoglycemia, and general diabetes care.  Patient has spoken with dietitian.  Instructed her regarding accessing Patient Ed Channel to view diabetes videos.    Patient states she has applied for Medicaid in the past, but  did not qualify because she was not yet a [place] citizen.  Should receive citizenship in a few months.  Patient expressing desire to control her diabetes and hopeful this can be achieved.  Thank you.  Brandy Lynn S. Marcelline Mates, RN, CNS, CDE Inpatient Diabetes Program, team pager [phone]

## 2014-04-18 NOTE — Progress Notes (Signed)
Pt arrive to unit in no s/s of distress. Pt transported by [facility] from [employer]. VS stable. Pt on insulin drip when arrive at 4 ml/hr. Admitting doctor paged for orders. Whiteboard updated. Callbell within reach. Will continue to monitor. Pt A&Ox4. Pt oriented to room and unit. Report received from sending nurse prior to pt's arrival.

## 2014-04-18 NOTE — Plan of Care (Signed)
Problem: Food- and Nutrition-Related Knowledge Deficit (NB-1.1) Goal: Nutrition education Formal process to instruct or train a patient/client in a skill or to impart knowledge to help patients/clients voluntarily manage or modify food choices and eating behavior to maintain or improve health. Outcome: Completed/Met Date Met:  04/18/14  RD consulted for nutrition education regarding diabetes.     Lab Results  Component Value Date    HGBA1C 7.8* 01/07/2014    RD provided "Carbohydrate Counting for People with Diabetes" handout from the Academy of Nutrition and Dietetics. Discussed different food groups and their effects on blood sugar, emphasizing carbohydrate-containing foods. Provided list of carbohydrates and recommended serving sizes of common foods.  Discussed importance of controlled and consistent carbohydrate intake throughout the day. Provided examples of ways to balance meals/snacks and encouraged intake of high-fiber, whole grain complex carbohydrates. Teach back method used.  Expect good compliance.  Body mass index is 35.04 kg/(m^2). Pt meets criteria for obesity based on current BMI.  Current diet order is carbohydrate modified, patient is consuming approximately 80% of meals at this time. Labs and medications reviewed. No further nutrition interventions warranted at this time. RD contact information provided. If additional nutrition issues arise, please re-consult RD.  Terrace Arabia RD, LDN

## 2014-04-18 NOTE — Progress Notes (Signed)
Utilization review completed.  

## 2014-04-18 NOTE — H&P (Signed)
[facility] History and Physical  Brandy Lynn GHW:[id number] DOB: [birth year] DOA: 04/17/2014  Referring physician: EDP PCP: Nicola Girt   Chief Complaint: Hyperglycemia   HPI: Brandy Lynn is a 48 y.o. female with h/o 10 years of DM2 who was recently prescribed Januvia by her doctor in [place] in addition to the metformin and glipizide to try and control her DM2.  Unfortunatly she ran out of the Januvia samples and her insurance wouldn't pay for it.  She has had increasing polydipsia, polyuria, polyphagia, for the past 3 days.  She checked her BGL at home and it was 600 tonight so she presented to the ED at [facility].  After discussing with the patient, it sounds like per her doctor, there wasn't much expectation for Jauniva to be sufficient to control her DM anyhow and had planned on transitioning her to insulin.  Review of Systems: Systems reviewed.  As above, otherwise negative  Past Medical History  Diagnosis Date  . T2DM (type 2 diabetes mellitus)   . HTN (hypertension) 11/12/2012  . Obesity 12/09/2010  . 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 NSAID and low-protein diet. Uric acid and CBC today.    . Hemoptysis, unspecified 12/2012, 12/2013   Past Surgical History  Procedure Laterality Date  . Tubal ligation     Social History:  reports that she has never smoked. She does not have any smokeless tobacco history on file. She reports that she does not drink alcohol or use illicit drugs.  Allergies  Allergen Reactions  . Fish Allergy Other (See Comments)    Seafood, gets nausea/diarrhea    Family History  Problem Relation Age of Onset  . Diabetes Father   . Diabetes Sister   . Diabetes Brother   . Breast cancer       Prior to Admission medications   Medication Sig Start Date End Date Taking? Authorizing Provider  cholecalciferol (VITAMIN D) 1000 UNITS tablet Take 1,000  Units by mouth daily.   Yes Historical Provider, MD  glipiZIDE (GLUCOTROL) 10 MG tablet Take 1 tablet (10 mg total) by mouth 2 (two) times daily before a meal. 09/08/13  Yes Zigmund Gottron, MD  metFORMIN (GLUCOPHAGE) 1000 MG tablet Take 1,000 mg by mouth 2 (two) times daily with a meal.   Yes Historical Provider, MD  simvastatin (ZOCOR) 20 MG tablet Take 20 mg by mouth daily.   Yes Historical Provider, MD   Physical Exam: Filed Vitals:   04/18/14 0124  BP: 142/76  Pulse: 82  Temp: 99.1 F (37.3 C)  Resp: 18    BP 142/76  Pulse 82  Temp(Src) 99.1 F (37.3 C) (Oral)  Resp 18  Ht 5\' 2"  (1.575 m)  Wt 86.909 kg (191 lb 9.6 oz)  BMI 35.04 kg/m2  SpO2 100%  General Appearance:    Alert, oriented, no distress, appears stated age  Head:    Normocephalic, atraumatic  Eyes:    PERRL, EOMI, sclera non-icteric        Nose:   Nares without drainage or epistaxis. Mucosa, turbinates normal  Throat:   Moist mucous membranes. Oropharynx without erythema or exudate.  Neck:   Supple. No carotid bruits.  No thyromegaly.  No lymphadenopathy.   Back:     No CVA tenderness, no spinal tenderness  Lungs:     Clear to auscultation bilaterally, without wheezes, rhonchi or rales  Chest wall:  No tenderness to palpitation  Heart:    Regular rate and rhythm without murmurs, gallops, rubs  Abdomen:     Soft, non-tender, nondistended, normal bowel sounds, no organomegaly  Genitalia:    deferred  Rectal:    deferred  Extremities:   No clubbing, cyanosis or edema.  Pulses:   2+ and symmetric all extremities  Skin:   Skin color, texture, turgor normal, no rashes or lesions  Lymph nodes:   Cervical, supraclavicular, and axillary nodes normal  Neurologic:   CNII-XII intact. Normal strength, sensation and reflexes      throughout    Labs on Admission:  Basic Metabolic Panel:  Recent Labs Lab 04/17/14 2220  NA 139  K 4.3  CL 97  CO2 29  GLUCOSE 669*  BUN 11  CREATININE 0.90  CALCIUM 10.3    Liver Function Tests: No results found for this basename: AST, ALT, ALKPHOS, BILITOT, PROT, ALBUMIN,  in the last 168 hours No results found for this basename: LIPASE, AMYLASE,  in the last 168 hours No results found for this basename: AMMONIA,  in the last 168 hours CBC:  Recent Labs Lab 04/17/14 2135  WBC 5.5  NEUTROABS 2.8  HGB 12.1  HCT 35.4*  MCV 83.5  PLT 165   Cardiac Enzymes: No results found for this basename: CKTOTAL, CKMB, CKMBINDEX, TROPONINI,  in the last 168 hours  BNP (last 3 results)  Recent Labs  01/07/14 0353  PROBNP 84.7   CBG:  Recent Labs Lab 04/17/14 2032 04/17/14 2353 04/18/14 0116  GLUCAP >600* 455* 422*    Radiological Exams on Admission: No results found.  EKG: Independently reviewed.  Assessment/Plan Principal Problem:   Hyperglycemia Active Problems:   DM (diabetes mellitus), type 2, uncontrolled   1. DM2 uncontrolled with hyperglycemia 1. Insulin gtt and IVF per protocol 2. Motrin for headache 3. Holding home PO hypoglycemics. 4. Plan to transition patient to [facility] insulin (lantus + novolog) after glucose stabilized.  Code Status: Full Code  Family Communication: No family in room Disposition Plan: Admit to inpatient   Time spent: 70 min  GARDNER, JARED M. [facility] Pager [phone]  If 7AM-7PM, please contact the day team taking care of the patient [email or web address] Password [email or web address] 04/18/2014, 1:30 AM

## 2014-04-18 NOTE — Progress Notes (Signed)
INITIAL NUTRITION ASSESSMENT  DOCUMENTATION CODES Per approved criteria  -Obesity Unspecified   INTERVENTION: - Glucerna Shake po BID, each supplement provides 220 kcal and 10 grams of protein - RD will continue to monitor for nutrition care plan.  NUTRITION DIAGNOSIS: Inadequate oral intake related to headache as evidenced by poor po intake.   Goal: Pt to meet >/= 90% of their estimated nutrition needs   Monitor:  Weight trend, po intake, acceptance of supplements, labs  Reason for Assessment: Consult for nutrition assessment  48 y.o. female  Admitting Dx: Hyperglycemia  ASSESSMENT: 48 y.o. female with h/o 10 years of DM2 who was recently prescribed Januvia by her doctor in [place] in addition to the metformin and glipizide to try and control her DM2. Unfortunatly she ran out of the Januvia samples and her insurance wouldn't pay for it. She has had increasing polydipsia, polyuria, polyphagia, for the past 3 days. She checked her BGL at home and it was 600 tonight so she presented to the ED at [facility].  - Pt reports that her appetite has been poor. She ate ~50% of her lunch today and was complaining of her head hurting. Pt drinks Glucerna Shakes at home when she is not eating well or if her blood sugar gets low. Pt had no signs of significant fat or muscle wasting.   Labs: CBGs: 128-296  Height: Ht Readings from Last 1 Encounters:  04/18/14 5\' 2"  (1.575 m)    Weight: Wt Readings from Last 1 Encounters:  04/18/14 191 lb 9.6 oz (86.909 kg)    Ideal Body Weight: 50.1 kg  % Ideal Body Weight: 173%  Wt Readings from Last 10 Encounters:  04/18/14 191 lb 9.6 oz (86.909 kg)  01/30/14 195 lb 3.2 oz (88.542 kg)  01/07/14 192 lb 12.8 oz (87.454 kg)  10/18/13 183 lb 9.6 oz (83.28 kg)  09/08/13 194 lb (87.998 kg)  09/08/13 194 lb (87.998 kg)  08/01/13 193 lb 6.4 oz (87.726 kg)  06/28/13 192 lb 4.8 oz (87.227 kg)  05/11/13 189 lb (85.73 kg)  01/13/13 189 lb (85.73 kg)     Usual Body Weight: 190-195 lbs  % Usual Body Weight: 100%  BMI:  Body mass index is 35.04 kg/(m^2).  Estimated Nutritional Needs: Kcal: 1500-1750 Protein: 100-115 g Fluid: 1.8 L/day  Skin: Intact  Diet Order: Carb Control  EDUCATION NEEDS: -Education needs addressed   Intake/Output Summary (Last 24 hours) at 04/18/14 1409 Last data filed at 04/18/14 0624  Gross per 24 hour  Intake 697.92 ml  Output      0 ml  Net 697.92 ml    Last BM: prior to admission   Labs:   Recent Labs Lab 04/17/14 2220 04/18/14 0528  NA 139 147  K 4.3 3.6*  CL 97 109  CO2 29 27  BUN 11 8  CREATININE 0.90 0.51  CALCIUM 10.3 8.7  GLUCOSE 669* 182*    CBG (last 3)   Recent Labs  04/18/14 0742 04/18/14 0855 04/18/14 1158  GLUCAP 128* 205* 296*    Scheduled Meds: . cholecalciferol  1,000 Units Oral Daily  . heparin  5,000 Units Subcutaneous 3 times per day  . insulin aspart  0-15 Units Subcutaneous TID WC  . insulin aspart  4 Units Subcutaneous TID WC  . insulin glargine  30 Units Subcutaneous Daily  . simvastatin  20 mg Oral Daily    Continuous Infusions: . insulin (NOVOLIN-R) infusion 3.7 Units/hr (04/18/14 4656)    Past Medical History  Diagnosis Date  . T2DM (type 2 diabetes mellitus)   . HTN (hypertension) 11/12/2012  . Obesity 12/09/2010  . 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 NSAID and low-protein diet. Uric acid and CBC today.    . Hemoptysis, unspecified 12/2012, 12/2013    Past Surgical History  Procedure Laterality Date  . Tubal ligation      Terrace Arabia RD, LDN

## 2014-04-18 NOTE — Progress Notes (Signed)
PATIENT DETAILS Name: Brandy Lynn Age: 48 y.o. Sex: female Date of Birth: 11-29-1965 Admit Date: 04/17/2014 Admitting Physician Etta Quill, DO LGX:[id number], Barbarann Ehlers  Subjective: Has a headache this morning. But otherwise feels better-significant decrease in polyuria, polydipsia.  Assessment/Plan: Principal Problem: Hyperglycemic hyperosmolar state - Placed on IV insulin on admission, have started Lantus/SSI/NovoLog this a.m. Await A1c. - Begin insulin teaching, and diabetic education, nutrition evaluation  Active Problems:   DM (diabetes mellitus), type 2, uncontrolled - Await A1c- however given profound hyperglycemia on admission-will require initiation of insulin. Started on 30 units of Lantus, 4 units of NovoLog with meals along with SSI. Continue to follow and optimize. - Begin insulin teaching, and diabetic education, nutrition evaluation  Headache - Started this morning, will try Toradol and other supportive measures. Follow clinically.  Dyslipidemia - Continue with statin sputum  History of asthma  - Lungs clear, this appears to be stable. Continue with as needed albuterol  Disposition: Remain inpatient  DVT Prophylaxis: Prophylactic Heparin   Code Status: Full code  Family Communication Spouse at bedside  Procedures:  None  CONSULTS:  None  Time spent 40 minutes-which includes 50% of the time with face-to-face with patient/ family and coordinating care related to the above assessment and plan.    MEDICATIONS: Scheduled Meds: . cholecalciferol  1,000 Units Oral Daily  . heparin  5,000 Units Subcutaneous 3 times per day  . insulin glargine  30 Units Subcutaneous Daily  . simvastatin  20 mg Oral Daily   Continuous Infusions: . sodium chloride Stopped (04/18/14 0438)  . dextrose 5 % and 0.45% NaCl 125 mL/hr at 04/18/14 0437  . insulin (NOVOLIN-R) infusion 3.7 Units/hr (04/18/14 0640)   PRN Meds:.acetaminophen, dextrose,  ketorolac, ondansetron (ZOFRAN) IV  Antibiotics: Anti-infectives   None       PHYSICAL EXAM: Vital signs in last 24 hours: Filed Vitals:   04/18/14 0015 04/18/14 0019 04/18/14 0124 04/18/14 0537  BP: 166/82 160/82 142/76 129/63  Pulse: 77 82 82 72  Temp:   99.1 F (37.3 C) 97.8 F (36.6 C)  TempSrc:   Oral Oral  Resp:   18 12  Height:   5\' 2"  (1.575 m)   Weight:   86.909 kg (191 lb 9.6 oz)   SpO2: 98% 97% 100% 100%    Weight change:  Filed Weights   04/17/14 2023 04/18/14 0124  Weight: 86.183 kg (190 lb) 86.909 kg (191 lb 9.6 oz)   Body mass index is 35.04 kg/(m^2).   Gen Exam: Awake and alert with clear speech.   Neck: Supple, No JVD.   Chest: B/L Clear.   CVS: S1 S2 Regular, no murmurs.  Abdomen: soft, BS +, non tender, non distended.  Extremities: no edema, lower extremities warm to touch. Neurologic: Non Focal.   Skin: No Rash.   Wounds: N/A.   Intake/Output from previous day:  Intake/Output Summary (Last 24 hours) at 04/18/14 1137 Last data filed at 04/18/14 0624  Gross per 24 hour  Intake 697.92 ml  Output      0 ml  Net 697.92 ml     LAB RESULTS: CBC  Recent Labs Lab 04/17/14 2135 04/18/14 0528  WBC 5.5 5.1  HGB 12.1 11.1*  HCT 35.4* 30.9*  PLT 165 156  MCV 83.5 80.9  MCH 28.5 29.1  MCHC 34.2 35.9  RDW 13.2 13.1  LYMPHSABS 2.1  --   MONOABS 0.5  --   EOSABS 0.1  --  BASOSABS 0.0  --     Chemistries   Recent Labs Lab 04/17/14 2220 04/18/14 0528  NA 139 147  K 4.3 3.6*  CL 97 109  CO2 29 27  GLUCOSE 669* 182*  BUN 11 8  CREATININE 0.90 0.51  CALCIUM 10.3 8.7    CBG:  Recent Labs Lab 04/18/14 0434 04/18/14 0538 04/18/14 0639 04/18/14 0742 04/18/14 0855  GLUCAP 143* 191* 183* 128* 205*    GFR Estimated Creatinine Clearance: 88 ml/min (by C-G formula based on Cr of 0.51).  Coagulation profile No results found for this basename: INR, PROTIME,  in the last 168 hours  Cardiac Enzymes No results found for this  basename: CK, CKMB, TROPONINI, MYOGLOBIN,  in the last 168 hours  No components found with this basename: POCBNP,  No results found for this basename: DDIMER,  in the last 72 hours No results found for this basename: HGBA1C,  in the last 72 hours No results found for this basename: CHOL, HDL, LDLCALC, TRIG, CHOLHDL, LDLDIRECT,  in the last 72 hours No results found for this basename: TSH, T4TOTAL, FREET3, T3FREE, THYROIDAB,  in the last 72 hours No results found for this basename: VITAMINB12, FOLATE, FERRITIN, TIBC, IRON, RETICCTPCT,  in the last 72 hours No results found for this basename: LIPASE, AMYLASE,  in the last 72 hours  Urine Studies No results found for this basename: UACOL, UAPR, USPG, UPH, UTP, UGL, UKET, UBIL, UHGB, UNIT, UROB, ULEU, UEPI, UWBC, URBC, UBAC, CAST, CRYS, UCOM, BILUA,  in the last 72 hours  MICROBIOLOGY: No results found for this or any previous visit (from the past 240 hour(s)).  RADIOLOGY STUDIES/RESULTS: No results found.  Oren Binet, MD  [facility] Pager:[phone]  If 7PM-7AM, please contact night-coverage [email or web address] Password [email or web address] 04/18/2014, 11:37 AM   LOS: 1 day   **Disclaimer: This note may have been dictated with voice recognition software. Similar sounding words can inadvertently be transcribed and this note may contain transcription errors which may not have been corrected upon publication of note.**

## 2014-04-18 NOTE — ED Notes (Signed)
Report given to RN at [place] [facility]

## 2014-04-19 DIAGNOSIS — E669 Obesity, unspecified: Secondary | ICD-10-CM

## 2014-04-19 LAB — URINALYSIS, ROUTINE W REFLEX MICROSCOPIC
Bilirubin Urine: NEGATIVE
Glucose, UA: >1000 mg/dL — AB
HGB URINE DIPSTICK: NEGATIVE
Ketones, ur: NEGATIVE mg/dL
Leukocytes, UA: NEGATIVE
Nitrite: NEGATIVE
Protein, ur: NEGATIVE mg/dL
Specific Gravity, Urine: 1.019 (ref 1.005–1.030)
Urobilinogen, UA: 0.2 mg/dL (ref 0.0–1.0)
pH: 6.5 (ref 5.0–8.0)

## 2014-04-19 LAB — GLUCOSE, CAPILLARY
GLUCOSE-CAPILLARY: 354 mg/dL — AB (ref 70–99)
GLUCOSE-CAPILLARY: 178 mg/dL — AB (ref 70–99)
Glucose-Capillary: 323 mg/dL — ABNORMAL HIGH (ref 70–99)
Glucose-Capillary: 149 mg/dL — ABNORMAL HIGH (ref 70–99)

## 2014-04-19 LAB — URINE MICROSCOPIC-ADD ON

## 2014-04-19 MED ORDER — FLUCONAZOLE 150 MG PO TABS
150.0000 mg | ORAL_TABLET | Freq: Once | ORAL | Status: AC
  Administered 2014-04-19: 150 mg via ORAL
  Filled 2014-04-19: qty 1

## 2014-04-19 MED ORDER — INSULIN ASPART PROT & ASPART (70-30 MIX) 100 UNIT/ML ~~LOC~~ SUSP
20.0000 [IU] | Freq: Two times a day (BID) | SUBCUTANEOUS | Status: DC
  Administered 2014-04-19 – 2014-04-20 (×2): 20 [IU] via SUBCUTANEOUS
  Filled 2014-04-19: qty 10

## 2014-04-19 MED ORDER — LISINOPRIL 10 MG PO TABS
10.0000 mg | ORAL_TABLET | Freq: Every day | ORAL | Status: DC
  Administered 2014-04-19 – 2014-04-20 (×2): 10 mg via ORAL
  Filled 2014-04-19 (×2): qty 1

## 2014-04-19 NOTE — Progress Notes (Signed)
[facility] PROGRESS NOTE  Brandy Lynn ZOX:[id number] DOB: Feb 15, 1966 DOA: 04/17/2014 PCP: Doug Sou B  Assessment/Plan:  Hyperglycemic hyperosmolar state  -Placed on IV insulin on admission and transitioned to Lantus/SSI/NovoLog regimen. -Hgb A1c 10.1- Can not afford lantus on discharge, so will transition to Insulin 70/30 -Placed on 20 units Insulin 70/30 BID -Begin insulin teaching, and diabetic education, nutrition evaluation   DM (diabetes mellitus), type 2, uncontrolled  -previously on Lantus but could not afford -Serial CBG readings still in 300s- continue to monitor -Transitioned from Lantus/Novolog/SSI regimen to 20 units Insulin 70/30 BID -Begin insulin teaching, and diabetic education, nutrition evaluation   Hypertension - stable; not currently on medication but states previously was on Lisinopril 5 mg.  -placed on Lisinopril 10 mg  Vaginal Itching -suspect yeast infection -obtained UA with microscopy -gave one dose of Diflucan emperically  Headache  - resolved.  Continue with as needed Toradol    Dyslipidemia  - Continue with simvastatin  History of asthma  - Stable- lungs clear, continue with as needed albuterol  Vitamin D deficiency  -continue with home regimen vitamin D   DVT Prophylaxis: Heparin [place] Code Status: Full Family Communication: Sister at bedside Disposition Plan: Home when stable   Consultants:  None  Procedures:  None  Antibiotics:  Oral Diflucan (9/2) one dose  HPI/Subjective: Patient complains of vaginal itching; denies dysuria or vaginal discharge. Otherwise feels better, denies headache, and  much improved on polyuria and polydipsia.  Objective: Filed Vitals:   04/19/14 0513  BP: 140/64  Pulse: 75  Temp: 98.1 F (36.7 C)  Resp: 13   No intake or output data in the 24 hours ending 04/19/14 0954 Filed Weights   04/17/14 2023 04/18/14 0124  Weight: 86.183 kg (190 lb) 86.909 kg (191 lb 9.6 oz)     Exam:  Gen: Alert and oriented, in no acute distress.  Sitting in bed. HEENT: Normocephalic, atraumatic.  Pupils symmertrical.  Moist mucosa.   Chest: clear to auscultate bilaterally, no ronchi or rales  Cardiac: Regular rate and rhythm, S1-S2, no rubs murmurs or gallops  Abdomen: soft, non tender, non distended, +bowel sounds. No guarding or rigidity  Extremities: Symmetrical in appearance without cyanosis or edema  Neurological: Alert awake oriented to time place and person.  Psychiatric: Appears normal.   Data Reviewed: Basic Metabolic Panel:  Recent Labs Lab 04/17/14 2220 04/18/14 0528  NA 139 147  K 4.3 3.6*  CL 97 109  CO2 29 27  GLUCOSE 669* 182*  BUN 11 8  CREATININE 0.90 0.51  CALCIUM 10.3 8.7   Liver Function Tests: No results found for this basename: AST, ALT, ALKPHOS, BILITOT, PROT, ALBUMIN,  in the last 168 hours No results found for this basename: LIPASE, AMYLASE,  in the last 168 hours No results found for this basename: AMMONIA,  in the last 168 hours CBC:  Recent Labs Lab 04/17/14 2135 04/18/14 0528  WBC 5.5 5.1  NEUTROABS 2.8  --   HGB 12.1 11.1*  HCT 35.4* 30.9*  MCV 83.5 80.9  PLT 165 156   Cardiac Enzymes: No results found for this basename: CKTOTAL, CKMB, CKMBINDEX, TROPONINI,  in the last 168 hours BNP (last 3 results)  Recent Labs  01/07/14 0353  PROBNP 84.7   CBG:  Recent Labs Lab 04/18/14 0855 04/18/14 1158 04/18/14 1718 04/18/14 2148 04/19/14 0751  GLUCAP 205* 296* 392* 230* 323*    No results found for this or any previous visit (from the past 240  hour(s)).   Studies: No results found.  Scheduled Meds: . cholecalciferol  1,000 Units Oral Daily  . feeding supplement (GLUCERNA SHAKE)  237 mL Oral BID BM  . fluconazole  150 mg Oral Once  . heparin  5,000 Units Subcutaneous 3 times per day  . insulin aspart  0-15 Units Subcutaneous TID WC  . insulin aspart  4 Units Subcutaneous TID WC  . insulin glargine  30  Units Subcutaneous Daily  . simvastatin  20 mg Oral Daily   Continuous Infusions: . insulin (NOVOLIN-R) infusion 3.7 Units/hr (04/18/14 0640)    Principal Problem:   Hyperglycemia Active Problems:   DM (diabetes mellitus), type 2, uncontrolled    Time spent: [place], [facility]  [facility] Pager [phone]. If 7PM-7AM, please contact night-coverage at [email or web address], password [email or web address] 04/19/2014, 9:54 AM  LOS: 2 days

## 2014-04-19 NOTE — Progress Notes (Signed)
Inpatient Diabetes Program Recommendations  AACE/ADA: New Consensus Statement on Inpatient Glycemic Control (2013)  Target Ranges:  Prepandial:   less than 140 mg/dL      Peak postprandial:   less than 180 mg/dL (1-2 hours)      Critically ill patients:  140 - 180 mg/dL   Reason for Assessment:  Follow-up from referral 04/18/14. Took Novolog 70/30 savings card to patient and she was able to activate it by phone.  Called and notified PA of savings card and that patient may be prescribed 70/30 mix Flexpen at discharge for 25$ co-pay.   Note new insulin regimen to start this PM.  Patient appreciative of help.  Will follow.  Thanks, Adah Perl, RN, BC-ADM Inpatient Diabetes Coordinator Pager [phone]

## 2014-04-19 NOTE — Progress Notes (Signed)
Pt seen and examined. Agree with assessment and plan as outlined above. Briefly, pt with poorly controlled glucose. No won BID 70/30 insulin. Agree with added SSI coverage. Glucose currently in the mid-100's. Cont current insulin regimen and titrate accordingly. SBP suboptimally controlled with values in the 150's. Home lisinopril dose increased from 5mg  to 10mg . GFR is >90. Cont to monitor renal fx closely.

## 2014-04-20 DIAGNOSIS — I10 Essential (primary) hypertension: Secondary | ICD-10-CM

## 2014-04-20 LAB — GLUCOSE, CAPILLARY
Glucose-Capillary: 204 mg/dL — ABNORMAL HIGH (ref 70–99)
Glucose-Capillary: 330 mg/dL — ABNORMAL HIGH (ref 70–99)

## 2014-04-20 MED ORDER — GLUCOSE BLOOD VI STRP: ORAL_STRIP | Status: DC

## 2014-04-20 MED ORDER — LISINOPRIL 10 MG PO TABS: 10.0000 mg | ORAL_TABLET | Freq: Every day | ORAL | Status: DC

## 2014-04-20 MED ORDER — INSULIN ASPART PROT & ASPART (70-30 MIX) 100 UNIT/ML ~~LOC~~ SUSP: 20.0000 [IU] | Freq: Two times a day (BID) | SUBCUTANEOUS | Status: DC

## 2014-04-20 MED ORDER — "INSULIN SYRINGE-NEEDLE U-100 30G X 1/2"" 0.3 ML MISC": 1.0000 | Freq: Two times a day (BID) | Status: DC

## 2014-04-20 NOTE — Discharge Summary (Signed)
Pt seen and examined. Agree with discharge summary. Pt presents with very poorly controlled glucose. Glucose now better controlled with 70-30 insulin to be titrated further as an outpatient. Blood pressures stable on lisinopril.

## 2014-04-20 NOTE — Progress Notes (Signed)
Patient discharge teaching given, including activity, diet, follow-up appoints, and medications. Patient verbalized understanding of all discharge instructions. IV access was d/c'd. Vitals are stable. Skin is intact except as charted in most recent assessments. Pt to be escorted out by NT, to be driven home by family. 

## 2014-04-20 NOTE — Progress Notes (Signed)
Inpatient Diabetes Program Recommendations  AACE/ADA: New Consensus Statement on Inpatient Glycemic Control (2013)  Target Ranges:  Prepandial:   less than 140 mg/dL      Peak postprandial:   less than 180 mg/dL (1-2 hours)      Critically ill patients:  140 - 180 mg/dL  Results for Brandy Lynn, Brandy Lynn (MRN [id number]) as of 04/20/2014 10:59  Ref. Range 04/19/2014 07:51 04/19/2014 11:49 04/19/2014 17:19 04/19/2014 21:47 04/20/2014 07:57  Glucose-Capillary Latest Range: 70-99 mg/dL 323 (H) 354 (H) 178 (H) 149 (H) 204 (H)   Reason for Visit: ongoing elevated CBG  Diabetes history: Type 2 Outpatient Diabetes medications:  Current orders for Inpatient glycemic control: Januvia 100mg /day, Actos 15mg /day, glucophage 1000mg  bid and Glucotrol 10mg  bid   Please consider increasing Novolog mix 70/30 to 25 units bid based on current CBG.  Gentry Fitz, RN, BA, MHA, CDE Diabetes Coordinator Inpatient Diabetes Program  [phone] (Team Pager) [phone] [facility]) 04/20/2014 11:07 AM

## 2014-04-20 NOTE — Discharge Summary (Signed)
Physician Discharge Summary  Brandy Lynn [id number]:[id number] DOB: [birth year] DOA: 04/17/2014  PCP: Doug Sou B  Admit date: 04/17/2014 Discharge date: 04/20/2014  Time spent: 35 minutes  Recommendations for Outpatient Follow-up:  1. Adjustment of Insulin as needed for diabetes 2. Management of HTN and dyslipidemia   Discharge Diagnoses:  Principal Problem:   Hyperglycemia Active Problems:   DM (diabetes mellitus), type 2, uncontrolled   Discharge Condition: Stable  Diet recommendation: Carb modified diet  Filed Weights   04/17/14 2023 04/18/14 0124  Weight: 86.183 kg (190 lb) 86.909 kg (191 lb 9.6 oz)    History of present illness:  Brandy Lynn is a 48 y.o. female with h/o 10 years of DM2, Hypertension, dyslipidemia, asthma, and vitamin D deficiency who presented to the ED with increasing polydipsia, polyuria, and polyphagia for the past 3 days.  She takes metformin, glipizide and Januvia for diabetes, but states she has not taken her medication for 5 days due to financial troubles and inability to afford the medication. She stated that she was on lantus previously but could not afford it and was thus placed on oral medication.  CBG checked at home was 600.  She denied SOB, chest pain, visual changes, or abdominal pain.  She was placed on IV insulin and transitioned to SSI insulin, and on discharge, she was placed on Insulin 70/30.  Hospital Course:   Hyperglycemic hyperosmolar state  -Placed on IV insulin on admission, transitioned to Lantus/SSI/NovoLog. Could not previously afford lantus, therefore,  transitioned to insulin 70/30 on discharge.  DM (diabetes mellitus), type 2, uncontrolled  -Stable on discharge -Hgb A1c 10.1-noncompliant due to financial strains, will be discharge on 20 units Insulin 70/30 BID  -provided insulin teaching, and diabetic education, nutrition evaluation   Hypertension  - Patient was maintained on Lisinopril 5 mg; discharged on  Lisinopril 10 mg   Vaginal Itching  -UA negative for infection- vaginal itching improved after treatment with Diflucan  Headache  - resolved  Dyslipidemia  -Continue with simvastatin   History of asthma  - Stable- lungs clear, continue with as needed albuterol   Vitamin D deficiency  -continue with home regimen vitamin D   Procedures: None  Consultations: None Discharge Exam: Filed Vitals:   04/20/14 1422  BP: 168/89  Pulse: 89  Temp: 98.2 F (36.8 C)  Resp: 16     Exam General: Alert and oriented obese AA female in NAD Eyes: Anicteric account.  Cardiovascular: Regular rate and rhythm.  No murmurs, rubs, or gallops. Respiratory: Clear to auscultate bilaterally.  No rhonchi or crepitations. Abdomen: Soft nontender bowel sounds present. No guarding or rigidity.  Musculoskeletal: No edema.  Psychiatric: Appears normal.  Neurologic: Alert awake oriented to time place and person.    Discharge Instructions     Medication List    STOP taking these medications       albuterol 108 (90 BASE) MCG/ACT inhaler  Commonly known as:  PROVENTIL HFA;VENTOLIN HFA     glipiZIDE 10 MG tablet  Commonly known as:  GLUCOTROL     metFORMIN 1000 MG tablet  Commonly known as:  GLUCOPHAGE     pioglitazone 15 MG tablet  Commonly known as:  ACTOS     sitaGLIPtin 100 MG tablet  Commonly known as:  JANUVIA      TAKE these medications       ferrous sulfate 325 (65 FE) MG tablet  Take 325 mg by mouth daily with breakfast.     glucose blood test  strip  Use as instructed     insulin aspart protamine- aspart (70-30) 100 UNIT/ML injection  Commonly known as:  NOVOLOG MIX 70/30  Inject 0.2 mLs (20 Units total) into the skin 2 (two) times daily with a meal.     Insulin Syringe-Needle U-100 30G X 1/2" 0.3 ML Misc  1 Device by Does not apply route 2 (two) times daily.     lisinopril 10 MG tablet  Commonly known as:  PRINIVIL,ZESTRIL  Take 1 tablet (10 mg total) by mouth  daily.     simvastatin 10 MG tablet  Commonly known as:  ZOCOR  Take 10 mg by mouth at bedtime.     Vitamin D-3 5000 UNITS Tabs  Take 5,000 Units by mouth daily.       Allergies  Allergen Reactions  . Shellfish Allergy Itching       Follow-up Information   Follow up with Doug Sou B. Schedule an appointment as soon as possible for a visit in 1 week.   Contact information:   [address] [place] [address] [phone]        The results of significant diagnostics from this hospitalization (including imaging, microbiology, ancillary and laboratory) are listed below for reference.    Significant Diagnostic Studies: No results found.  Microbiology: No results found for this or any previous visit (from the past 240 hour(s)).   Labs: Basic Metabolic Panel:  Recent Labs Lab 04/17/14 2220 04/18/14 0528  NA 139 147  K 4.3 3.6*  CL 97 109  CO2 29 27  GLUCOSE 669* 182*  BUN 11 8  CREATININE 0.90 0.51  CALCIUM 10.3 8.7   Liver Function Tests: No results found for this basename: AST, ALT, ALKPHOS, BILITOT, PROT, ALBUMIN,  in the last 168 hours No results found for this basename: LIPASE, AMYLASE,  in the last 168 hours No results found for this basename: AMMONIA,  in the last 168 hours CBC:  Recent Labs Lab 04/17/14 2135 04/18/14 0528  WBC 5.5 5.1  NEUTROABS 2.8  --   HGB 12.1 11.1*  HCT 35.4* 30.9*  MCV 83.5 80.9  PLT 165 156   Cardiac Enzymes: No results found for this basename: CKTOTAL, CKMB, CKMBINDEX, TROPONINI,  in the last 168 hours BNP: BNP (last 3 results)  Recent Labs  01/07/14 0353  PROBNP 84.7   CBG:  Recent Labs Lab 04/19/14 1149 04/19/14 1719 04/19/14 2147 04/20/14 0757 04/20/14 1159  GLUCAP 354* 178* 149* 204* 330*       Signed:  Lacy Duverney PA-C  [facility] 04/20/2014, 2:45 PM

## 2014-05-26 ENCOUNTER — Other Ambulatory Visit: Payer: Self-pay | Admitting: Family Medicine

## 2014-07-11 ENCOUNTER — Other Ambulatory Visit: Payer: Self-pay | Admitting: Family Medicine

## 2014-10-01 IMAGING — CR DG CHEST 2V
2 series · 2 of 2 positions shown · non-contrast
Comparison: 12/30/2012

CLINICAL DATA: Hemoptysis, cough, and shortness of breath.

EXAM:
CHEST  2 VIEW

[w chest pa]
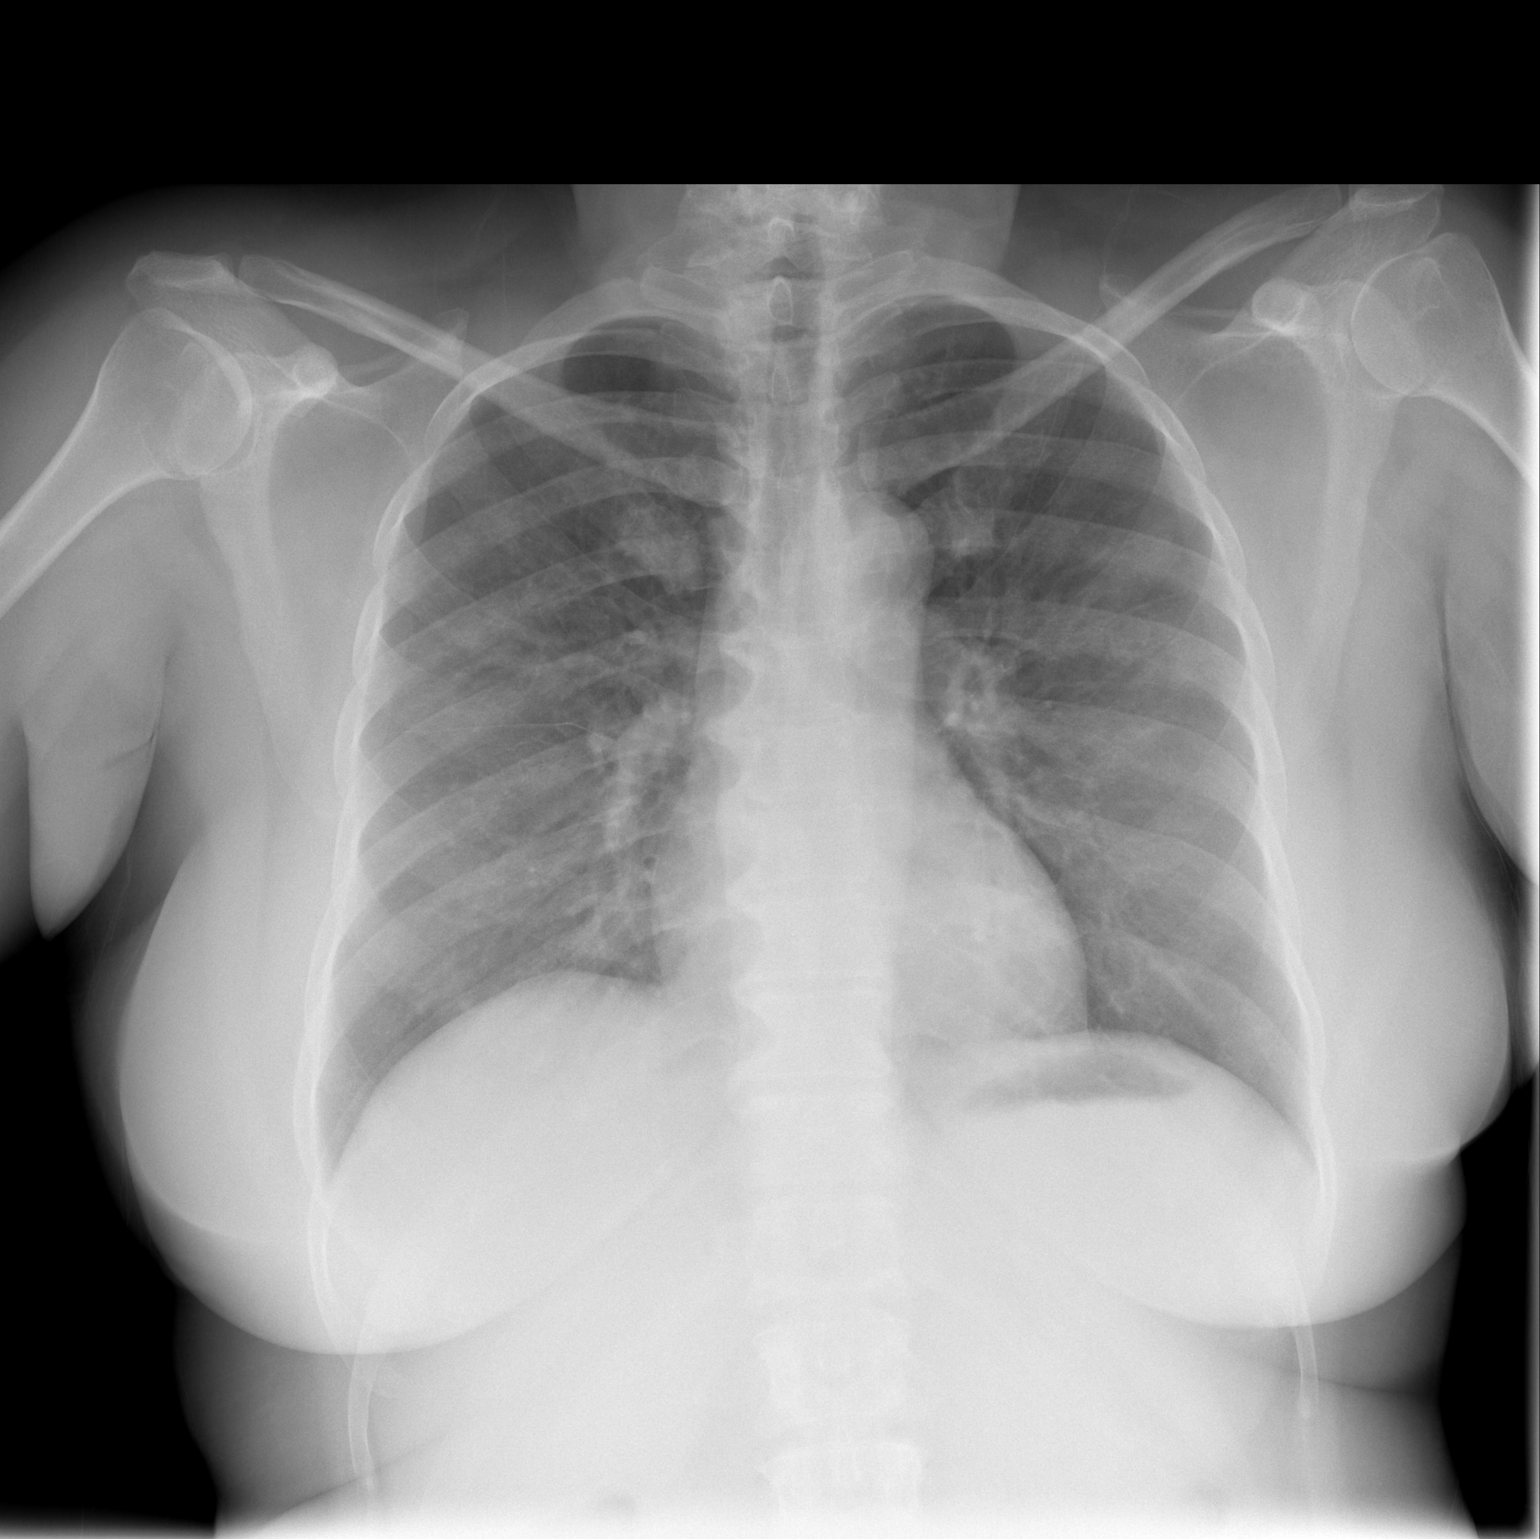

[w chest lat]
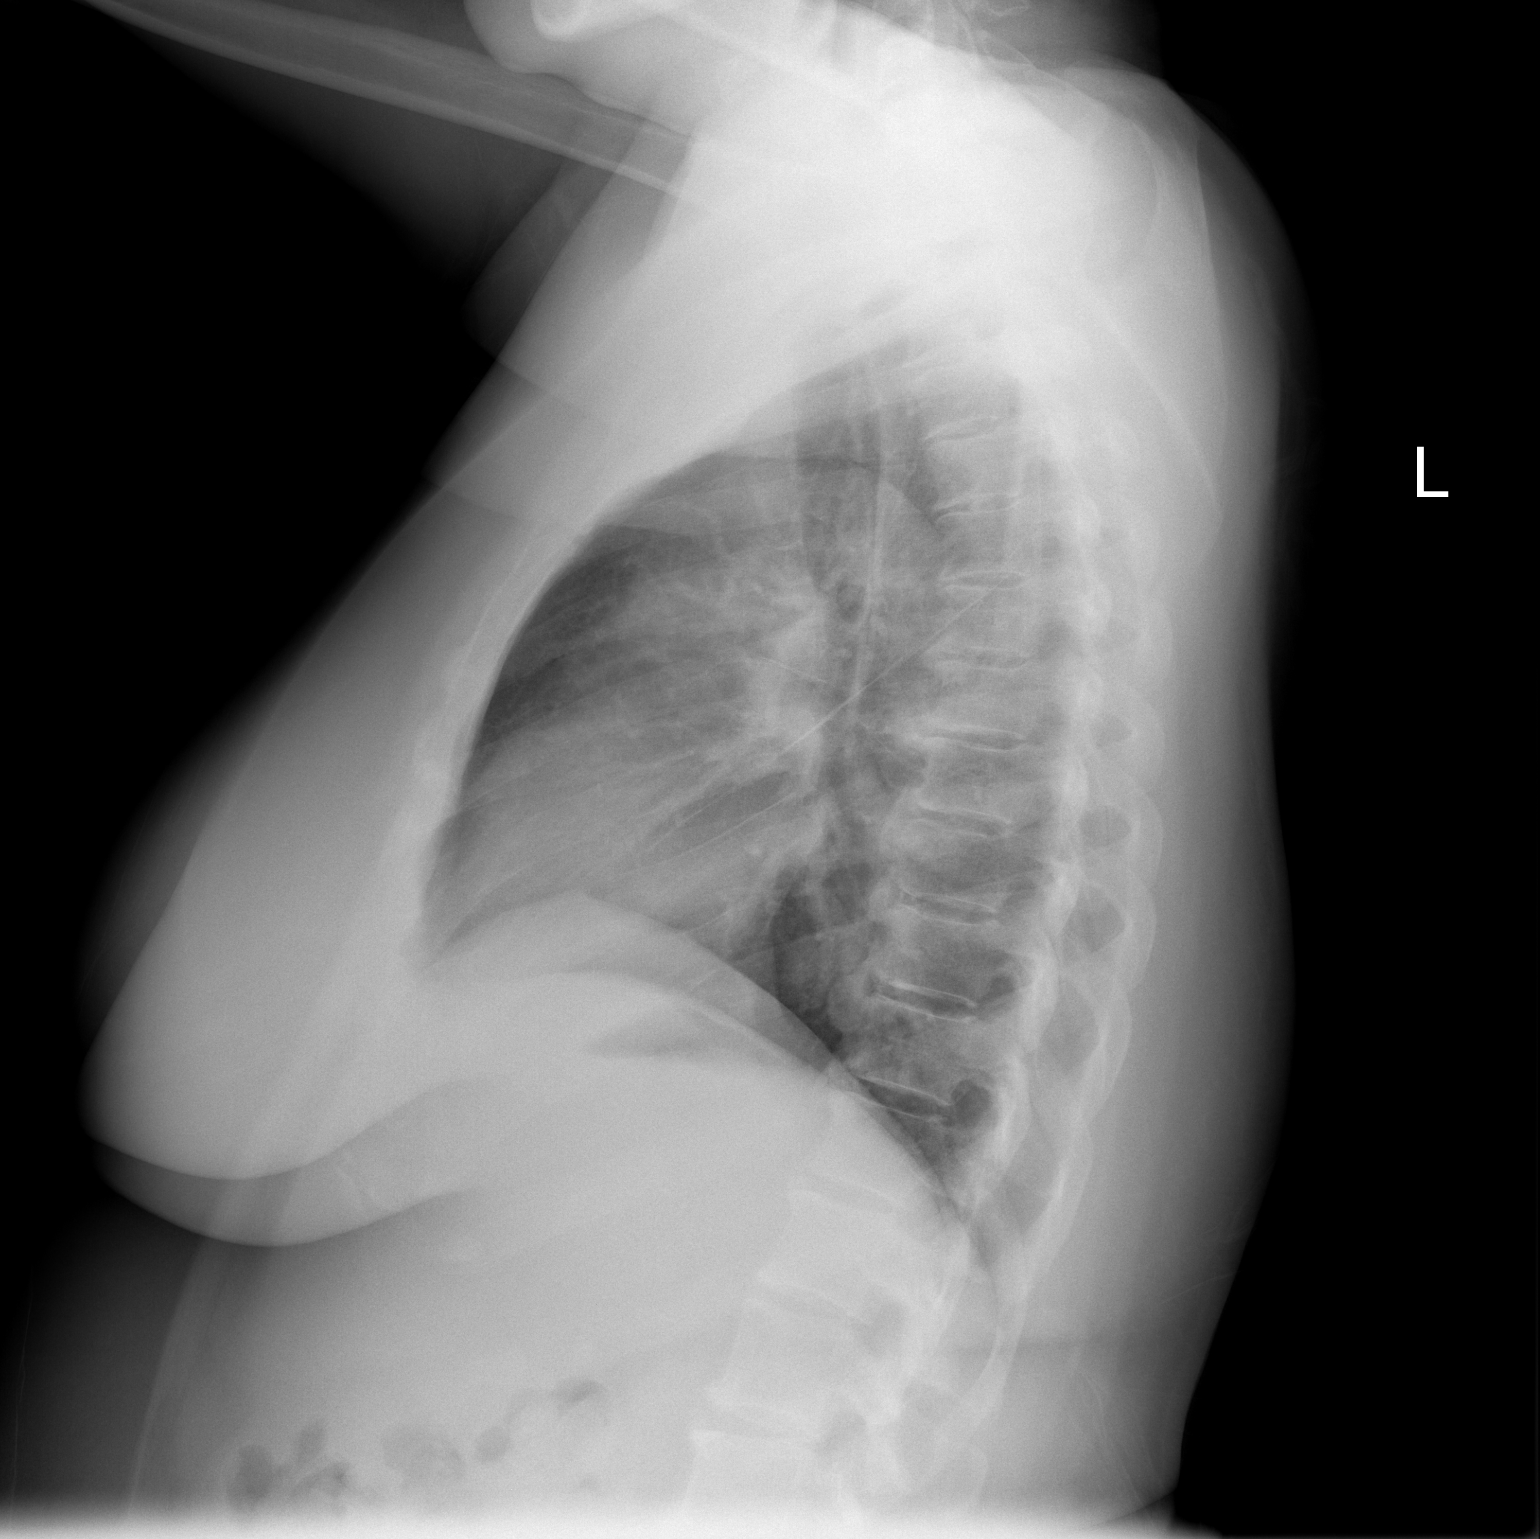

[2 of 2 positions shown; findings below may reference images not displayed]

FINDINGS: There small vague areas of increased density in both upper lobes,
nonspecific. Heart size and vascularity are normal. No effusions. No
acute osseous abnormality.
IMPRESSION: Subtle areas of increased density in both upper lobes which could
represent small areas of infiltrate.

## 2014-10-04 ENCOUNTER — Other Ambulatory Visit: Payer: Self-pay | Admitting: Family Medicine

## 2014-10-05 ENCOUNTER — Other Ambulatory Visit: Payer: Self-pay | Admitting: Family Medicine

## 2014-12-30 ENCOUNTER — Emergency Department (HOSPITAL_BASED_OUTPATIENT_CLINIC_OR_DEPARTMENT_OTHER)
Admission: EM | Admit: 2014-12-30 | Discharge: 2014-12-30 | Disposition: A | Discharge status: Home / Self Care | Payer: Self-pay | Attending: Emergency Medicine | Admitting: Emergency Medicine

## 2014-12-30 ENCOUNTER — Encounter (HOSPITAL_BASED_OUTPATIENT_CLINIC_OR_DEPARTMENT_OTHER): Payer: Self-pay | Admitting: *Deleted

## 2014-12-30 ENCOUNTER — Emergency Department (HOSPITAL_BASED_OUTPATIENT_CLINIC_OR_DEPARTMENT_OTHER): Payer: Self-pay

## 2014-12-30 DIAGNOSIS — E119 Type 2 diabetes mellitus without complications: Secondary | ICD-10-CM | POA: Insufficient documentation

## 2014-12-30 DIAGNOSIS — E669 Obesity, unspecified: Secondary | ICD-10-CM | POA: Insufficient documentation

## 2014-12-30 DIAGNOSIS — R6883 Chills (without fever): Secondary | ICD-10-CM | POA: Insufficient documentation

## 2014-12-30 DIAGNOSIS — M545 Low back pain: Secondary | ICD-10-CM | POA: Insufficient documentation

## 2014-12-30 DIAGNOSIS — Z79899 Other long term (current) drug therapy: Secondary | ICD-10-CM | POA: Insufficient documentation

## 2014-12-30 DIAGNOSIS — Z794 Long term (current) use of insulin: Secondary | ICD-10-CM | POA: Insufficient documentation

## 2014-12-30 DIAGNOSIS — I10 Essential (primary) hypertension: Secondary | ICD-10-CM | POA: Insufficient documentation

## 2014-12-30 DIAGNOSIS — M549 Dorsalgia, unspecified: Secondary | ICD-10-CM

## 2014-12-30 LAB — CBC WITH DIFFERENTIAL/PLATELET
BASOS ABS: 0 10*3/uL (ref 0.0–0.1)
Basophils Relative: 0 % (ref 0–1)
EOS PCT: 1 % (ref 0–5)
Eosinophils Absolute: 0.1 10*3/uL (ref 0.0–0.7)
HCT: 36.4 % (ref 36.0–46.0)
Hemoglobin: 12.5 g/dL (ref 12.0–15.0)
Lymphocytes Relative: 17 % (ref 12–46)
Lymphs Abs: 1.7 10*3/uL (ref 0.7–4.0)
MCH: 28.3 pg (ref 26.0–34.0)
MCHC: 34.3 g/dL (ref 30.0–36.0)
MCV: 82.5 fL (ref 78.0–100.0)
MONO ABS: 0.8 10*3/uL (ref 0.1–1.0)
Monocytes Relative: 8 % (ref 3–12)
NEUTROS PCT: 74 % (ref 43–77)
Neutro Abs: 7.2 10*3/uL (ref 1.7–7.7)
Platelets: 190 10*3/uL (ref 150–400)
RBC: 4.41 MIL/uL (ref 3.87–5.11)
RDW: 13.3 % (ref 11.5–15.5)
WBC: 9.8 10*3/uL (ref 4.0–10.5)

## 2014-12-30 LAB — COMPREHENSIVE METABOLIC PANEL
ALBUMIN: 4.4 g/dL (ref 3.5–5.0)
ALK PHOS: 61 U/L (ref 38–126)
ALT: 16 U/L (ref 14–54)
ANION GAP: 9 (ref 5–15)
AST: 17 U/L (ref 15–41)
BUN: 9 mg/dL (ref 6–20)
CO2: 28 mmol/L (ref 22–32)
CREATININE: 0.76 mg/dL (ref 0.44–1.00)
Calcium: 9.1 mg/dL (ref 8.9–10.3)
Chloride: 101 mmol/L (ref 101–111)
GFR calc non Af Amer: >60 mL/min (ref >60)
Glucose, Bld: 246 mg/dL — ABNORMAL HIGH (ref 65–99)
POTASSIUM: 3.5 mmol/L (ref 3.5–5.1)
SODIUM: 138 mmol/L (ref 135–145)
TOTAL PROTEIN: 7.4 g/dL (ref 6.5–8.1)
Total Bilirubin: 0.5 mg/dL (ref 0.3–1.2)

## 2014-12-30 LAB — URINE MICROSCOPIC-ADD ON

## 2014-12-30 LAB — URINALYSIS, ROUTINE W REFLEX MICROSCOPIC
BILIRUBIN URINE: NEGATIVE
Glucose, UA: >1000 mg/dL — AB
KETONES UR: NEGATIVE mg/dL
Leukocytes, UA: NEGATIVE
NITRITE: NEGATIVE
Protein, ur: NEGATIVE mg/dL
Specific Gravity, Urine: 1.011 (ref 1.005–1.030)
Urobilinogen, UA: 0.2 mg/dL (ref 0.0–1.0)
pH: 7 (ref 5.0–8.0)

## 2014-12-30 MED ORDER — METHOCARBAMOL 500 MG PO TABS: 500.0000 mg | ORAL_TABLET | Freq: Two times a day (BID) | ORAL | Status: DC

## 2014-12-30 MED ORDER — SODIUM CHLORIDE 0.9 % IV BOLUS (SEPSIS)
1000.0000 mL | Freq: Once | INTRAVENOUS | Status: AC
  Administered 2014-12-30: 1000 mL via INTRAVENOUS

## 2014-12-30 MED ORDER — ONDANSETRON HCL 4 MG/2ML IJ SOLN
4.0000 mg | Freq: Once | INTRAMUSCULAR | Status: AC
  Administered 2014-12-30: 4 mg via INTRAVENOUS
  Filled 2014-12-30: qty 2

## 2014-12-30 MED ORDER — MORPHINE SULFATE 4 MG/ML IJ SOLN
4.0000 mg | INTRAMUSCULAR | Status: DC | PRN
  Administered 2014-12-30: 4 mg via INTRAVENOUS
  Filled 2014-12-30: qty 1

## 2014-12-30 NOTE — ED Notes (Signed)
C/o low back pain , vomited x 1,

## 2014-12-30 NOTE — Discharge Instructions (Signed)
No specific cause for your symptoms and was found with your testing in the emergency room today.  You're being given a prescription for a muscle relaxant which may help with your back pain.  Return to the emergency room if you have worsening or changing symptoms.

## 2014-12-30 NOTE — ED Provider Notes (Signed)
CSN: [id number]     Arrival date & time 12/30/14  0135 History   First MD Initiated Contact with Patient 12/30/14 0204     Chief Complaint  Patient presents with  . Back Pain      HPI  Patient presents for evaluation of back pain and an episode of vomiting.  Has had back pain in her low lumbar midline back since yesterday. No numbness or weakness or radiation of pain to her extremities. States she felt cold tonight was worried her blood sugar might be low. She checked temperature at home and it was normal. Vomited once yesterday but not today. Mild headache this morning but not tonight. History is diabetes, previous single episode of hemoptysis, and gout.   Past Medical History  Diagnosis Date  . T2DM (type 2 diabetes mellitus)   . HTN (hypertension) 11/12/2012  . Obesity 12/09/2010  . 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 NSAID and low-protein diet. Uric acid and CBC today.    . Hemoptysis, unspecified 12/2012, 12/2013   Past Surgical History  Procedure Laterality Date  . Tubal ligation     Family History  Problem Relation Age of Onset  . Diabetes Father   . Diabetes Sister   . Diabetes Brother   . Breast cancer     History  Substance Use Topics  . Smoking status: Never Smoker   . Smokeless tobacco: Not on file  . Alcohol Use: No   OB History    No data available     Review of Systems  Constitutional: Positive for chills. Negative for fever, diaphoresis, appetite change and fatigue.  HENT: Negative for mouth sores, sore throat and trouble swallowing.   Eyes: Negative for visual disturbance.  Respiratory: Negative for cough, chest tightness, shortness of breath and wheezing.   Cardiovascular: Negative for chest pain.  Gastrointestinal: Negative for nausea, vomiting, abdominal pain, diarrhea and abdominal distention.  Endocrine: Negative for polydipsia, polyphagia and polyuria.    Genitourinary: Negative for dysuria, frequency and hematuria.  Musculoskeletal: Positive for back pain. Negative for gait problem.  Skin: Negative for color change, pallor and rash.  Neurological: Negative for dizziness, syncope, light-headedness and headaches.  Hematological: Does not bruise/bleed easily.  Psychiatric/Behavioral: Negative for behavioral problems and confusion.      Allergies  Shellfish allergy  Home Medications   Prior to Admission medications   Medication Sig Start Date End Date Taking? Authorizing Provider  metFORMIN (GLUCOPHAGE) 1000 MG tablet Take 1,000 mg by mouth 2 (two) times daily with a meal.   Yes Historical Provider, MD  Cholecalciferol (VITAMIN D-3) 5000 UNITS TABS Take 5,000 Units by mouth daily.    Historical Provider, MD  ferrous sulfate 325 (65 FE) MG tablet Take 325 mg by mouth daily with breakfast.    Historical Provider, MD  glipiZIDE (GLUCOTROL) 10 MG tablet TAKE ONE TABLET BY MOUTH TWICE DAILY BEFORE MEAL(S) 10/06/14   Zenia Resides, MD  glucose blood test strip Use as instructed 04/20/14   Waldon Merl, PA-C  insulin aspart protamine- aspart (NOVOLOG MIX 70/30) (70-30) 100 UNIT/ML injection Inject 0.2 mLs (20 Units total) into the skin 2 (two) times daily with a meal. 04/20/14   Donne Hazel, MD  Insulin Syringe-Needle U-100 30G X 1/2" 0.3 ML MISC 1 Device by Does not apply route 2 (two) times daily. 04/20/14   Sahar Heloise Beecham, PA-C  lisinopril (PRINIVIL,ZESTRIL) 10  MG tablet Take 1 tablet (10 mg total) by mouth daily. 04/20/14   Donne Hazel, MD  methocarbamol (ROBAXIN) 500 MG tablet Take 1 tablet (500 mg total) by mouth 2 (two) times daily. 12/30/14   Tanna Furry, MD  simvastatin (ZOCOR) 10 MG tablet Take 10 mg by mouth at bedtime.    Historical Provider, MD   BP 169/88 mmHg  Pulse 112  Temp(Src) 99.2 F (37.3 C) (Oral)  Resp 22  Ht 5\' 5"  (1.651 m)  Wt 190 lb (86.183 kg)  BMI 31.62 kg/m2 Physical Exam  Constitutional: She is oriented to  person, place, and time. She appears well-developed and well-nourished. No distress.  HENT:  Head: Normocephalic.  Eyes: Conjunctivae are normal. Pupils are equal, round, and reactive to light. No scleral icterus.  Neck: Normal range of motion. Neck supple. No thyromegaly present.  Neck supple. No meningismus.  Cardiovascular: Normal rate and regular rhythm.  Exam reveals no gallop and no friction rub.   No murmur heard. Pulmonary/Chest: Effort normal and breath sounds normal. No respiratory distress. She has no wheezes. She has no rales.  Abdominal: Soft. Bowel sounds are normal. She exhibits no distension. There is no tenderness. There is no rebound.  Musculoskeletal: Normal range of motion.       Arms: Neurological: She is alert and oriented to person, place, and time.  Skin: Skin is warm and dry. No rash noted.  Psychiatric: She has a normal mood and affect. Her behavior is normal.    ED Course  Procedures (including critical care time) Labs Review Labs Reviewed  URINALYSIS, ROUTINE W REFLEX MICROSCOPIC - Abnormal; Notable for the following:    Glucose, UA >1000 (*)    Hgb urine dipstick TRACE (*)    All other components within normal limits  COMPREHENSIVE METABOLIC PANEL - Abnormal; Notable for the following:    Glucose, Bld 246 (*)    All other components within normal limits  URINE CULTURE  CBC WITH DIFFERENTIAL/PLATELET  URINE MICROSCOPIC-ADD ON    Imaging Review Dg Chest 2 View  12/30/2014   CLINICAL DATA:  Low back pain with onset yesterday. Vomiting. Headache.  EXAM: CHEST  2 VIEW  COMPARISON:  01/30/2014  FINDINGS: The heart size and mediastinal contours are within normal limits. Both lungs are clear. The visualized skeletal structures are unremarkable.  IMPRESSION: No active cardiopulmonary disease.   Electronically Signed   By: Lucienne Capers M.D.   On: 12/30/2014 03:05     EKG Interpretation None      MDM   Final diagnoses:  Chills  Back pain       Reassuring studies. No leukocytosis. Normal hepatobiliary enzymes. Urine does not appear infected. Normal chest x-ray. On reexam she is resting comfortably. Plan is home, rest, muscle relaxants. I'll avoid anti-inflammatories as per her pulmonologist no with her episode of hemoptysis several years ago.  Tanna Furry, MD 12/30/14 [phone]

## 2014-12-30 NOTE — ED Notes (Signed)
C/o low back pain onset yesterday  Vomited x 1, and also ha

## 2015-01-03 LAB — URINE CULTURE: Colony Count: 85000

## 2015-01-04 ENCOUNTER — Telehealth (HOSPITAL_BASED_OUTPATIENT_CLINIC_OR_DEPARTMENT_OTHER): Payer: Self-pay | Admitting: Emergency Medicine

## 2015-01-04 NOTE — Telephone Encounter (Signed)
Post ED Visit - Positive Culture Follow-up  Culture report reviewed by antimicrobial stewardship pharmacist: []  Wes Dulaney, Pharm.D., BCPS [x]  Heide Guile, Pharm.D., BCPS []  Alycia Rossetti, Pharm.D., BCPS []  [place], [place].D., BCPS, AAHIVP []  Legrand Como, Pharm.D., BCPS, AAHIVP []  Isac Sarna, Pharm.D., BCPS  Positive urine culture E. coli Treated with none, asymptomatic, and no further patient follow-up is required at this time.  Hazle Nordmann 01/04/2015, 11:21 AM

## 2015-02-03 ENCOUNTER — Other Ambulatory Visit: Payer: Self-pay | Admitting: Family Medicine

## 2015-02-08 ENCOUNTER — Other Ambulatory Visit: Payer: Self-pay | Admitting: Family Medicine

## 2015-06-26 ENCOUNTER — Encounter (HOSPITAL_COMMUNITY): Payer: Self-pay

## 2015-06-26 ENCOUNTER — Inpatient Hospital Stay (HOSPITAL_COMMUNITY)
Admission: EM | Admit: 2015-06-26 | Discharge: 2015-07-01 | DRG: 872 | Disposition: A | Discharge status: Home / Self Care | Payer: 59 | Attending: Internal Medicine | Admitting: Internal Medicine

## 2015-06-26 ENCOUNTER — Observation Stay (HOSPITAL_COMMUNITY): Payer: 59

## 2015-06-26 DIAGNOSIS — I10 Essential (primary) hypertension: Secondary | ICD-10-CM | POA: Diagnosis present

## 2015-06-26 DIAGNOSIS — N12 Tubulo-interstitial nephritis, not specified as acute or chronic: Secondary | ICD-10-CM | POA: Diagnosis present

## 2015-06-26 DIAGNOSIS — Z794 Long term (current) use of insulin: Secondary | ICD-10-CM

## 2015-06-26 DIAGNOSIS — D649 Anemia, unspecified: Secondary | ICD-10-CM

## 2015-06-26 DIAGNOSIS — E1165 Type 2 diabetes mellitus with hyperglycemia: Secondary | ICD-10-CM | POA: Diagnosis present

## 2015-06-26 DIAGNOSIS — M546 Pain in thoracic spine: Secondary | ICD-10-CM

## 2015-06-26 DIAGNOSIS — E669 Obesity, unspecified: Secondary | ICD-10-CM | POA: Diagnosis present

## 2015-06-26 DIAGNOSIS — R300 Dysuria: Secondary | ICD-10-CM | POA: Diagnosis not present

## 2015-06-26 DIAGNOSIS — R000 Tachycardia, unspecified: Secondary | ICD-10-CM

## 2015-06-26 DIAGNOSIS — Z7984 Long term (current) use of oral hypoglycemic drugs: Secondary | ICD-10-CM

## 2015-06-26 DIAGNOSIS — Z7982 Long term (current) use of aspirin: Secondary | ICD-10-CM

## 2015-06-26 DIAGNOSIS — A419 Sepsis, unspecified organism: Secondary | ICD-10-CM | POA: Diagnosis not present

## 2015-06-26 DIAGNOSIS — E876 Hypokalemia: Secondary | ICD-10-CM | POA: Diagnosis not present

## 2015-06-26 DIAGNOSIS — Z79899 Other long term (current) drug therapy: Secondary | ICD-10-CM

## 2015-06-26 DIAGNOSIS — D509 Iron deficiency anemia, unspecified: Secondary | ICD-10-CM | POA: Diagnosis present

## 2015-06-26 DIAGNOSIS — R112 Nausea with vomiting, unspecified: Secondary | ICD-10-CM | POA: Diagnosis not present

## 2015-06-26 DIAGNOSIS — Z6831 Body mass index (BMI) 31.0-31.9, adult: Secondary | ICD-10-CM

## 2015-06-26 DIAGNOSIS — Z833 Family history of diabetes mellitus: Secondary | ICD-10-CM

## 2015-06-26 HISTORY — DX: Tubulo-interstitial nephritis, not specified as acute or chronic: N12

## 2015-06-26 HISTORY — DX: Sickle-cell trait: D57.3

## 2015-06-26 HISTORY — DX: Sepsis, unspecified organism: A41.9

## 2015-06-26 HISTORY — DX: Hyperlipidemia, unspecified: E78.5

## 2015-06-26 HISTORY — DX: Type 2 diabetes mellitus without complications: E11.9

## 2015-06-26 LAB — URINALYSIS, ROUTINE W REFLEX MICROSCOPIC
Bilirubin Urine: NEGATIVE
GLUCOSE, UA: NEGATIVE mg/dL
KETONES UR: 40 mg/dL — AB
Nitrite: NEGATIVE
Protein, ur: 30 mg/dL — AB
Specific Gravity, Urine: 1.012 (ref 1.005–1.030)
Urobilinogen, UA: 0.2 mg/dL (ref 0.0–1.0)
pH: 5.5 (ref 5.0–8.0)

## 2015-06-26 LAB — CBC
HCT: 33.8 % — ABNORMAL LOW (ref 36.0–46.0)
Hemoglobin: 11.4 g/dL — ABNORMAL LOW (ref 12.0–15.0)
MCH: 26.8 pg (ref 26.0–34.0)
MCHC: 33.7 g/dL (ref 30.0–36.0)
MCV: 79.3 fL (ref 78.0–100.0)
PLATELETS: 197 10*3/uL (ref 150–400)
RBC: 4.26 MIL/uL (ref 3.87–5.11)
RDW: 14.1 % (ref 11.5–15.5)
WBC: 12.6 10*3/uL — ABNORMAL HIGH (ref 4.0–10.5)

## 2015-06-26 LAB — LIPASE, BLOOD: LIPASE: 19 U/L (ref 11–51)

## 2015-06-26 LAB — COMPREHENSIVE METABOLIC PANEL
ALT: 24 U/L (ref 14–54)
AST: 25 U/L (ref 15–41)
Albumin: 3.6 g/dL (ref 3.5–5.0)
Alkaline Phosphatase: 85 U/L (ref 38–126)
Anion gap: 15 (ref 5–15)
BUN: 6 mg/dL (ref 6–20)
CHLORIDE: 98 mmol/L — AB (ref 101–111)
CO2: 24 mmol/L (ref 22–32)
CREATININE: 0.74 mg/dL (ref 0.44–1.00)
Calcium: 9.3 mg/dL (ref 8.9–10.3)
GFR calc non Af Amer: >60 mL/min (ref >60)
Glucose, Bld: 199 mg/dL — ABNORMAL HIGH (ref 65–99)
Potassium: 3.6 mmol/L (ref 3.5–5.1)
SODIUM: 137 mmol/L (ref 135–145)
Total Bilirubin: 0.7 mg/dL (ref 0.3–1.2)
Total Protein: 7.3 g/dL (ref 6.5–8.1)

## 2015-06-26 LAB — URINE MICROSCOPIC-ADD ON

## 2015-06-26 LAB — I-STAT CG4 LACTIC ACID, ED
Lactic Acid, Venous: 2 mmol/L (ref 0.5–2.0)
Lactic Acid, Venous: 2.79 mmol/L (ref 0.5–2.0)

## 2015-06-26 LAB — POC URINE PREG, ED: PREG TEST UR: NEGATIVE

## 2015-06-26 MED ORDER — ONDANSETRON HCL 4 MG/2ML IJ SOLN
4.0000 mg | Freq: Four times a day (QID) | INTRAMUSCULAR | Status: DC | PRN
  Administered 2015-06-26 – 2015-06-30 (×4): 4 mg via INTRAVENOUS
  Filled 2015-06-26 (×4): qty 2

## 2015-06-26 MED ORDER — SODIUM CHLORIDE 0.9 % IV BOLUS (SEPSIS)
1000.0000 mL | Freq: Once | INTRAVENOUS | Status: AC
  Administered 2015-06-26: 1000 mL via INTRAVENOUS

## 2015-06-26 MED ORDER — ACETAMINOPHEN 325 MG PO TABS
650.0000 mg | ORAL_TABLET | Freq: Four times a day (QID) | ORAL | Status: DC | PRN
  Administered 2015-06-26 – 2015-06-29 (×6): 650 mg via ORAL
  Filled 2015-06-26 (×6): qty 2

## 2015-06-26 MED ORDER — ASPIRIN EC 81 MG PO TBEC
81.0000 mg | DELAYED_RELEASE_TABLET | Freq: Every day | ORAL | Status: DC
  Administered 2015-06-27 – 2015-07-01 (×5): 81 mg via ORAL
  Filled 2015-06-26 (×5): qty 1

## 2015-06-26 MED ORDER — SULFAMETHOXAZOLE-TRIMETHOPRIM 800-160 MG PO TABS: 1.0000 | ORAL_TABLET | Freq: Two times a day (BID) | ORAL | Status: DC

## 2015-06-26 MED ORDER — HYDROCODONE-ACETAMINOPHEN 5-325 MG PO TABS
1.0000 | ORAL_TABLET | ORAL | Status: DC | PRN
  Administered 2015-06-26 – 2015-06-27 (×3): 1 via ORAL
  Administered 2015-06-28: 2 via ORAL
  Administered 2015-06-28: 1 via ORAL
  Administered 2015-06-28 – 2015-07-01 (×9): 2 via ORAL
  Filled 2015-06-26: qty 2
  Filled 2015-06-26 (×2): qty 1
  Filled 2015-06-26 (×6): qty 2
  Filled 2015-06-26: qty 1
  Filled 2015-06-26 (×3): qty 2

## 2015-06-26 MED ORDER — ACETAMINOPHEN 325 MG PO TABS
325.0000 mg | ORAL_TABLET | Freq: Once | ORAL | Status: AC
  Administered 2015-06-26: 325 mg via ORAL
  Filled 2015-06-26: qty 1

## 2015-06-26 MED ORDER — ENOXAPARIN SODIUM 40 MG/0.4ML ~~LOC~~ SOLN
40.0000 mg | SUBCUTANEOUS | Status: DC
  Administered 2015-06-27 – 2015-07-01 (×5): 40 mg via SUBCUTANEOUS
  Filled 2015-06-26 (×5): qty 0.4

## 2015-06-26 MED ORDER — DEXTROSE 5 % IV SOLN
1.0000 g | INTRAVENOUS | Status: DC
  Administered 2015-06-27: 1 g via INTRAVENOUS
  Filled 2015-06-26 (×2): qty 10

## 2015-06-26 MED ORDER — DEXTROSE 5 % IV SOLN
1.0000 g | Freq: Once | INTRAVENOUS | Status: AC
  Administered 2015-06-26: 1 g via INTRAVENOUS
  Filled 2015-06-26: qty 10

## 2015-06-26 MED ORDER — INSULIN ASPART 100 UNIT/ML ~~LOC~~ SOLN
0.0000 [IU] | Freq: Three times a day (TID) | SUBCUTANEOUS | Status: DC
  Administered 2015-06-27: 3 [IU] via SUBCUTANEOUS
  Administered 2015-06-27 – 2015-06-28 (×4): 2 [IU] via SUBCUTANEOUS
  Administered 2015-06-28: 1 [IU] via SUBCUTANEOUS
  Administered 2015-06-29: 2 [IU] via SUBCUTANEOUS
  Administered 2015-06-29: 1 [IU] via SUBCUTANEOUS
  Administered 2015-06-29: 2 [IU] via SUBCUTANEOUS
  Administered 2015-06-30 – 2015-07-01 (×4): 1 [IU] via SUBCUTANEOUS
  Administered 2015-07-01: 2 [IU] via SUBCUTANEOUS

## 2015-06-26 MED ORDER — MORPHINE SULFATE (PF) 2 MG/ML IV SOLN: 1.0000 mg | INTRAVENOUS | Status: DC | PRN

## 2015-06-26 MED ORDER — LISINOPRIL 10 MG PO TABS
10.0000 mg | ORAL_TABLET | Freq: Every day | ORAL | Status: DC
  Administered 2015-06-27 – 2015-07-01 (×5): 10 mg via ORAL
  Filled 2015-06-26 (×6): qty 1

## 2015-06-26 MED ORDER — HYDRALAZINE HCL 20 MG/ML IJ SOLN
10.0000 mg | Freq: Once | INTRAMUSCULAR | Status: AC
  Administered 2015-06-26: 10 mg via INTRAVENOUS
  Filled 2015-06-26: qty 1

## 2015-06-26 MED ORDER — SODIUM CHLORIDE 0.9 % IJ SOLN
3.0000 mL | Freq: Two times a day (BID) | INTRAMUSCULAR | Status: DC
  Administered 2015-06-26 – 2015-07-01 (×6): 3 mL via INTRAVENOUS

## 2015-06-26 MED ORDER — SODIUM CHLORIDE 0.9 % IV SOLN
INTRAVENOUS | Status: DC
  Administered 2015-06-26 – 2015-06-30 (×8): via INTRAVENOUS

## 2015-06-26 MED ORDER — SIMVASTATIN 10 MG PO TABS
10.0000 mg | ORAL_TABLET | Freq: Every day | ORAL | Status: DC
  Administered 2015-06-26 – 2015-06-30 (×5): 10 mg via ORAL
  Filled 2015-06-26 (×5): qty 1

## 2015-06-26 NOTE — ED Provider Notes (Signed)
CSN: [id number]     Arrival date & time 06/26/15  1606 History   First MD Initiated Contact with Patient 06/26/15 1632     Chief Complaint  Patient presents with  . Urinary Frequency  . Abdominal Pain     (Consider location/radiation/quality/duration/timing/severity/associated sxs/prior Treatment) Patient is a 49 y.o. female presenting with frequency and abdominal pain. The history is provided by the patient and medical records. No language interpreter was used.  Urinary Frequency Associated symptoms include abdominal pain, a fever, nausea and vomiting. Pertinent negatives include no congestion, rash or sore throat.  Abdominal Pain Associated symptoms: dysuria, fever, nausea and vomiting   Associated symptoms: no constipation, no diarrhea, no shortness of breath, no sore throat, no vaginal bleeding and no vaginal discharge    Yulanda Holik is a 49 y.o. female  with a PMH of DM2, HTN presents to the Emergency Department complaining of worsening right back pain. Patient was seen by PCP and started on Macrobid, but despite treatment patient has had worsening pain. Associated symptoms include nausea, vomiting, dysuria, and urgency. Patient also reports fevers over the past two days. 9/10 pain at present.    Past Medical History  Diagnosis Date  . T2DM (type 2 diabetes mellitus) ([place])   . HTN (hypertension) 11/12/2012  . Obesity 12/09/2010  . 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 NSAID and low-protein diet. Uric acid and CBC today.    . Hemoptysis, unspecified 12/2012, 12/2013   Past Surgical History  Procedure Laterality Date  . Tubal ligation     Family History  Problem Relation Age of Onset  . Diabetes Father   . Diabetes Sister   . Diabetes Brother   . Breast cancer     Social History  Substance Use Topics  . Smoking status: Never Smoker   . Smokeless tobacco: None  . Alcohol Use: No    OB History    No data available     Review of Systems  Constitutional: Positive for fever and appetite change. Negative for activity change.  HENT: Negative for congestion, rhinorrhea and sore throat.   Eyes: Negative for visual disturbance.  Respiratory: Negative for shortness of breath and wheezing.   Cardiovascular: Negative.   Gastrointestinal: Positive for nausea, vomiting and abdominal pain. Negative for diarrhea and constipation.  Endocrine: Negative for polydipsia and polyuria.  Genitourinary: Positive for dysuria, urgency and frequency. Negative for vaginal bleeding and vaginal discharge.  Musculoskeletal: Positive for back pain.  Skin: Negative for rash.  Neurological: Negative for dizziness.      Allergies  Shellfish allergy  Home Medications   Prior to Admission medications   Medication Sig Start Date End Date Taking? Authorizing Provider  Cholecalciferol (VITAMIN D-3) 5000 UNITS TABS Take 5,000 Units by mouth daily.    Historical Provider, MD  ferrous sulfate 325 (65 FE) MG tablet Take 325 mg by mouth daily with breakfast.    Historical Provider, MD  glipiZIDE (GLUCOTROL) 10 MG tablet TAKE ONE TABLET BY MOUTH TWICE DAILY BEFORE MEAL(S) 10/06/14   Zenia Resides, MD  glucose blood test strip Use as instructed 04/20/14   Waldon Merl, PA-C  insulin aspart protamine- aspart (NOVOLOG MIX 70/30) (70-30) 100 UNIT/ML injection Inject 0.2 mLs (20 Units total) into the skin 2 (two) times daily with a meal. 04/20/14   Donne Hazel, MD  Insulin Syringe-Needle U-100 30G X 1/2" 0.3 ML MISC 1  Device by Does not apply route 2 (two) times daily. 04/20/14   Sahar Heloise Beecham, PA-C  lisinopril (PRINIVIL,ZESTRIL) 10 MG tablet Take 1 tablet (10 mg total) by mouth daily. 04/20/14   Donne Hazel, MD  metFORMIN (GLUCOPHAGE) 1000 MG tablet Take 1,000 mg by mouth 2 (two) times daily with a meal.    Historical Provider, MD  methocarbamol (ROBAXIN) 500 MG tablet Take 1 tablet (500 mg total) by  mouth 2 (two) times daily. 12/30/14   Tanna Furry, MD  simvastatin (ZOCOR) 10 MG tablet Take 10 mg by mouth at bedtime.    Historical Provider, MD   BP 129/74 mmHg  Pulse 140  Temp(Src) 101.3 F (38.5 C) (Oral)  Resp 18  SpO2 100% Physical Exam  Constitutional: She is oriented to person, place, and time. She appears well-developed and well-nourished.  Alert and in no acute distress  HENT:  Head: Normocephalic and atraumatic.  Cardiovascular: Normal rate, regular rhythm, normal heart sounds and intact distal pulses.  Exam reveals no gallop and no friction rub.   No murmur heard. Pulmonary/Chest: Effort normal and breath sounds normal. No respiratory distress. She has no wheezes. She has no rales. She exhibits no tenderness.  Abdominal: She exhibits no mass. There is no rebound and no guarding.  Abdomen soft, non-distended Suprapubic and right abdominal tenderness Bowel sounds positive in all four quadrants  Musculoskeletal: She exhibits no edema.  + right CVA tenderness  Neurological: She is alert and oriented to person, place, and time.  Skin: Skin is warm and dry. No rash noted.  Psychiatric: She has a normal mood and affect. Her behavior is normal. Judgment and thought content normal.  Nursing note and vitals reviewed.   ED Course  Procedures (including critical care time) Labs Review Labs Reviewed  LIPASE, BLOOD  COMPREHENSIVE METABOLIC PANEL  CBC  URINALYSIS, ROUTINE W REFLEX MICROSCOPIC (NOT AT [facility])  I-STAT CG4 LACTIC ACID, ED    Imaging Review No results found. I have personally reviewed and evaluated these images and lab results as part of my medical decision-making.   EKG Interpretation None      MDM   Final diagnoses:  None   Jamelah Briguglio presents with worsening back pain. Patient being treated for UTI with Macrobid with no improvement.  Labs: CBC - WBC 12.6, H&H of 11.4/33.8 ;  CMP: cl 98, glucose 199; lactic 2.00 UA shows moderate Hgb, 40  ketones, 30 protein, small leuks - nitrite negative. WBC 11-20.  Upreg neg  Care assumed by Dr. Billy Fischer, case discussed, plan agreed upon.    [facility] Janari Yamada, PA-C 06/26/15 2018  Gareth Morgan, MD 06/27/15 1128

## 2015-06-26 NOTE — ED Notes (Signed)
Hospitalist at bedside 

## 2015-06-26 NOTE — H&P (Signed)
[facility] History and Physical  Brandy Lynn FTD:[id number] DOB: [birth year] DOA: 06/26/2015  Referring physician: ED PCP: Nicola Girt, DO   Chief Complaint: Difficulty urinating  HPI:  Patient is a 49 year old female with a past medical history significant for poorly controlled diabetes mellitus type 2; presents for worsening symptoms of dysuria. Symptoms started approximately 3 days ago with what she reports as urgency, burning, frequency, and right-sided flank pain. She was seen by her primary care office at [facility], PA-C on 06/25/2015 and was prescribed Macrobid. She also had a CT of the abdomen and pelvis without contrast and ultrasound those records available however symptoms progressively worsened and patient started having subjective fever with persistent nausea and vomiting,  unable to keep anything down on her stomach today.    Review of Systems  Constitutional: Positive for fever and chills.  HENT: Negative for tinnitus.   Eyes: Negative for double vision and photophobia.  Respiratory: Positive for shortness of breath. Negative for hemoptysis and sputum production.   Cardiovascular: Positive for palpitations. Negative for chest pain.  Gastrointestinal: Positive for nausea, vomiting and abdominal pain.  Genitourinary: Positive for dysuria, urgency, frequency, hematuria and flank pain.  Musculoskeletal: Positive for back pain and joint pain. Negative for neck pain.  Skin: Negative for itching and rash.  Neurological: Positive for headaches. Negative for tingling, tremors and seizures.  Endo/Heme/Allergies: Negative for environmental allergies. Does not bruise/bleed easily.  Psychiatric/Behavioral: Negative for depression. The patient has insomnia.       Past Medical History  Diagnosis Date  . T2DM (type 2 diabetes mellitus) ([place])   . HTN (hypertension) 11/12/2012  . Obesity 12/09/2010  . Podagra 09/24/2012    Right great toe MTP joint  tenderness and redness, no skin breakdown, exam most consistent with podagra. NSAID and low-protein diet. Uric acid and CBC today.    . Hemoptysis, unspecified 12/2012, 12/2013     Past Surgical History  Procedure Laterality Date  . Tubal ligation        Social History:  reports that she has never smoked. She does not have any smokeless tobacco history on file. She reports that she does not drink alcohol or use illicit drugs. where does patient live--home  Can patient participate in ADLs? Yes  Allergies  Allergen Reactions  . Shellfish Allergy Itching    Family History  Problem Relation Age of Onset  . Diabetes Father   . Diabetes Sister   . Diabetes Brother   . Breast cancer       Prior to Admission medications   Medication Sig Start Date End Date Taking? Authorizing Provider  aspirin EC 81 MG tablet Take 81 mg by mouth daily.   Yes Historical Provider, MD  Exenatide ER (BYDUREON) 2 MG PEN Inject 2 mg into the skin once a week.   Yes Historical Provider, MD  glipiZIDE (GLUCOTROL) 10 MG tablet TAKE ONE TABLET BY MOUTH TWICE DAILY BEFORE MEAL(S) Patient taking differently: TAKE 10 MG BY MOUTH TWICE DAILY BEFORE MEAL(S) 10/06/14  Yes Zenia Resides, MD  lisinopril (PRINIVIL,ZESTRIL) 10 MG tablet Take 1 tablet (10 mg total) by mouth daily. 04/20/14  Yes Donne Hazel, MD  metFORMIN (GLUCOPHAGE) 1000 MG tablet Take 1,000 mg by mouth 2 (two) times daily with a meal.   Yes Historical Provider, MD  nitrofurantoin, macrocrystal-monohydrate, (MACROBID) 100 MG capsule Take 100 mg by mouth 2 (two) times daily.   Yes Historical Provider, MD  oxyCODONE-acetaminophen (PERCOCET/ROXICET) 5-325 MG tablet Take  1 tablet by mouth every 4 (four) hours as needed for moderate pain or severe pain.   Yes Historical Provider, MD  simvastatin (ZOCOR) 10 MG tablet Take 10 mg by mouth at bedtime.   Yes Historical Provider, MD  Vitamin D, Ergocalciferol, (DRISDOL) 50000 UNITS CAPS capsule Take 50,000 Units  by mouth every 7 (seven) days.   Yes Historical Provider, MD  insulin aspart protamine- aspart (NOVOLOG MIX 70/30) (70-30) 100 UNIT/ML injection Inject 0.2 mLs (20 Units total) into the skin 2 (two) times daily with a meal. Patient not taking: Reported on 06/26/2015 04/20/14   Donne Hazel, MD  methocarbamol (ROBAXIN) 500 MG tablet Take 1 tablet (500 mg total) by mouth 2 (two) times daily. Patient not taking: Reported on 06/26/2015 12/30/14   Tanna Furry, MD     Physical Exam: Filed Vitals:   06/26/15 1945 06/26/15 1946 06/26/15 2001 06/26/15 2110  BP: 148/69 148/69  198/96  Pulse: 110 110  129  Temp:   99.2 F (37.3 C)   TempSrc:   Oral   Resp:  18    SpO2: 98% 98%  100%     Constitutional: Vital signs reviewed. Patient is in acute distress ill-appearing but alert and oriented 3 and also incorporated with exam.  Head: Normocephalic and atraumatic  Ear: TM normal bilaterally  Mouth: no erythema or exudates, mildly dry mucous membranes  Eyes: PERRL, EOMI, conjunctivae normal, No scleral icterus.  Neck: Supple, Trachea midline normal ROM, No JVD, mass, thyromegaly, or carotid bruit present.  Cardiovascular: Tachycardic S1 normal, S2 normal, no MRG, pulses symmetric and intact bilaterally  Pulmonary/Chest: CTAB, no wheezes, rales, or rhonchi  Abdominal: Soft but there is generalized tenderness to palpation, positive bowel sounds in all 4 quadrants GU: CVA tenderness on the right flank  Musculoskeletal: No joint deformities, erythema, or stiffness, ROM full and no nontender Ext: no edema and no cyanosis, pulses palpable bilaterally (DP and PT)  Hematology: no cervical, inginal, or axillary adenopathy.  Neurological: A&O x3, Strenght is normal and symmetric bilaterally, cranial nerve II-XII are grossly intact, no focal motor deficit, sensory intact to light touch bilaterally.  Skin: Warm, dry and intact. No rash, cyanosis, or clubbing.  Psychiatric: Patient appears to be somewhat anxious.  Speech and behavior is normal. Judgment and thought content normal. Cognition and memory are normal.      Data Review   Micro Results Recent Results (from the past 240 hour(s))  Blood culture (routine x 2)     Status: None (Preliminary result)   Collection Time: 06/26/15  5:43 PM  Result Value Ref Range Status   Specimen Description BLOOD LEFT ANTECUBITAL  Final   Special Requests BOTTLES DRAWN AEROBIC AND ANAEROBIC 5ML  Final   Culture PENDING  Incomplete   Report Status PENDING  Incomplete    Radiology Reports No results found.   CBC  Recent Labs Lab 06/26/15 1642  WBC 12.6*  HGB 11.4*  HCT 33.8*  PLT 197  MCV 79.3  MCH 26.8  MCHC 33.7  RDW 14.1    Chemistries   Recent Labs Lab 06/26/15 1642  NA 137  K 3.6  CL 98*  CO2 24  GLUCOSE 199*  BUN 6  CREATININE 0.74  CALCIUM 9.3  AST 25  ALT 24  ALKPHOS 85  BILITOT 0.7   ------------------------------------------------------------------------------------------------------------------ CrCl cannot be calculated (Unknown ideal weight.). ------------------------------------------------------------------------------------------------------------------ No results for input(s): HGBA1C in the last 72 hours. ------------------------------------------------------------------------------------------------------------------ No results for input(s): CHOL, HDL, LDLCALC, TRIG,  CHOLHDL, LDLDIRECT in the last 72 hours. ------------------------------------------------------------------------------------------------------------------ No results for input(s): TSH, T4TOTAL, T25FREE, THYROIDAB in the last 72 hours.  Invalid input(s): FREET3 ------------------------------------------------------------------------------------------------------------------ No results for input(s): VITAMINB12, FOLATE, FERRITIN, TIBC, IRON, RETICCTPCT in the last 72 hours.  Coagulation profile No results for input(s): INR, PROTIME in the last 168  hours.  No results for input(s): DDIMER in the last 72 hours.  Cardiac Enzymes No results for input(s): CKMB, TROPONINI, MYOGLOBIN in the last 168 hours.  Invalid input(s): CK ------------------------------------------------------------------------------------------------------------------ Invalid input(s): POCBNP   CBG: No results for input(s): GLUCAP in the last 168 hours.     EKG: Pending   Assessment/Plan Principal Problem:   Sepsis ([place]) secondary to suspected pyelonephritis based on history. Patient with positive signs of a UTI and right-sided flank pain. Patient was found to be tachycardic with heart rate of 140, fever over 101.25F, WBC count 12.6 and elevated lactic level. The patient has had CT scan of abdomen done at [facility].  Patient received IV Rocephin while in the ED and 3 L of fluid -will need to request records or contact PCP in a.m. PCP office number is [phone] - IV fluids at 120 mL per hour -Continue Rocephin daily -Follow up CBC in a.m. -Follow-up urine and blood cultures  Pyelonephritis:see above    Poorly controlled type 2 diabetes mellitus ([place]) patient reports that she was just switched to insulin at her PCPs office. Last hemoglobin A1c is unknown by the patient, but appears to have been 10.1 on 04/18/2014.  -Held patient's home oral hypoglycemic agents -Cbg checks every before meals and at bedtime -Sliding scale of insulin -Hypoglycemic protocols  Tachycardia heart rates in the 140s -EKG pending  Anemia mild hemoglobin and hematocrit 11.4 and 33.8 respectively -Follow up CBC in a.m.    Nausea & vomiting -prn Zofran    Code Status:   full Family Communication: bedside Disposition Plan: admit   Total time spent 55 minutes.Greater than 50% of this time was spent in counseling, explanation of diagnosis, planning of further management, and coordination of care  [facility] Pager [phone]  If  7PM-7AM, please contact night-coverage [email or web address] Password [facility] 06/26/2015, 9:31 PM

## 2015-06-26 NOTE — ED Notes (Signed)
Pt reports recently being dx with UTI, started on abx. Pt now reports generalized weakness, abdominal pain and fever.

## 2015-06-27 DIAGNOSIS — R000 Tachycardia, unspecified: Secondary | ICD-10-CM | POA: Diagnosis not present

## 2015-06-27 DIAGNOSIS — A419 Sepsis, unspecified organism: Secondary | ICD-10-CM | POA: Diagnosis not present

## 2015-06-27 DIAGNOSIS — E669 Obesity, unspecified: Secondary | ICD-10-CM | POA: Diagnosis present

## 2015-06-27 DIAGNOSIS — Z7982 Long term (current) use of aspirin: Secondary | ICD-10-CM | POA: Diagnosis not present

## 2015-06-27 DIAGNOSIS — D509 Iron deficiency anemia, unspecified: Secondary | ICD-10-CM | POA: Diagnosis present

## 2015-06-27 DIAGNOSIS — Z79899 Other long term (current) drug therapy: Secondary | ICD-10-CM | POA: Diagnosis not present

## 2015-06-27 DIAGNOSIS — R300 Dysuria: Secondary | ICD-10-CM | POA: Diagnosis present

## 2015-06-27 DIAGNOSIS — Z6831 Body mass index (BMI) 31.0-31.9, adult: Secondary | ICD-10-CM | POA: Diagnosis not present

## 2015-06-27 DIAGNOSIS — Z794 Long term (current) use of insulin: Secondary | ICD-10-CM | POA: Diagnosis not present

## 2015-06-27 DIAGNOSIS — E1165 Type 2 diabetes mellitus with hyperglycemia: Secondary | ICD-10-CM | POA: Diagnosis not present

## 2015-06-27 DIAGNOSIS — Z833 Family history of diabetes mellitus: Secondary | ICD-10-CM | POA: Diagnosis not present

## 2015-06-27 DIAGNOSIS — N12 Tubulo-interstitial nephritis, not specified as acute or chronic: Secondary | ICD-10-CM | POA: Diagnosis not present

## 2015-06-27 DIAGNOSIS — I10 Essential (primary) hypertension: Secondary | ICD-10-CM | POA: Diagnosis present

## 2015-06-27 DIAGNOSIS — E876 Hypokalemia: Secondary | ICD-10-CM | POA: Diagnosis not present

## 2015-06-27 DIAGNOSIS — R112 Nausea with vomiting, unspecified: Secondary | ICD-10-CM | POA: Diagnosis not present

## 2015-06-27 DIAGNOSIS — Z7984 Long term (current) use of oral hypoglycemic drugs: Secondary | ICD-10-CM | POA: Diagnosis not present

## 2015-06-27 LAB — COMPREHENSIVE METABOLIC PANEL
ALT: 28 U/L (ref 14–54)
AST: 34 U/L (ref 15–41)
Albumin: 2.8 g/dL — ABNORMAL LOW (ref 3.5–5.0)
Alkaline Phosphatase: 75 U/L (ref 38–126)
Anion gap: 9 (ref 5–15)
BILIRUBIN TOTAL: 0.4 mg/dL (ref 0.3–1.2)
CO2: 27 mmol/L (ref 22–32)
CREATININE: 0.71 mg/dL (ref 0.44–1.00)
Calcium: 8.2 mg/dL — ABNORMAL LOW (ref 8.9–10.3)
Chloride: 103 mmol/L (ref 101–111)
GFR calc Af Amer: >60 mL/min (ref >60)
Glucose, Bld: 296 mg/dL — ABNORMAL HIGH (ref 65–99)
POTASSIUM: 3.2 mmol/L — AB (ref 3.5–5.1)
Sodium: 139 mmol/L (ref 135–145)
TOTAL PROTEIN: 6.4 g/dL — AB (ref 6.5–8.1)

## 2015-06-27 LAB — CBC WITH DIFFERENTIAL/PLATELET
Basophils Absolute: 0 10*3/uL (ref 0.0–0.1)
Basophils Relative: 0 %
EOS PCT: 0 %
Eosinophils Absolute: 0 10*3/uL (ref 0.0–0.7)
HEMATOCRIT: 29.7 % — AB (ref 36.0–46.0)
HEMOGLOBIN: 9.9 g/dL — AB (ref 12.0–15.0)
LYMPHS ABS: 1.2 10*3/uL (ref 0.7–4.0)
LYMPHS PCT: 13 %
MCH: 26.7 pg (ref 26.0–34.0)
MCHC: 33.3 g/dL (ref 30.0–36.0)
MCV: 80.1 fL (ref 78.0–100.0)
Monocytes Absolute: 0.9 10*3/uL (ref 0.1–1.0)
Monocytes Relative: 10 %
NEUTROS ABS: 7.2 10*3/uL (ref 1.7–7.7)
NEUTROS PCT: 77 %
Platelets: 162 10*3/uL (ref 150–400)
RBC: 3.71 MIL/uL — AB (ref 3.87–5.11)
RDW: 14.2 % (ref 11.5–15.5)
WBC: 9.2 10*3/uL (ref 4.0–10.5)

## 2015-06-27 LAB — GLUCOSE, CAPILLARY
GLUCOSE-CAPILLARY: 227 mg/dL — AB (ref 65–99)
Glucose-Capillary: 164 mg/dL — ABNORMAL HIGH (ref 65–99)
Glucose-Capillary: 190 mg/dL — ABNORMAL HIGH (ref 65–99)
Glucose-Capillary: 162 mg/dL — ABNORMAL HIGH (ref 65–99)
Glucose-Capillary: 147 mg/dL — ABNORMAL HIGH (ref 65–99)

## 2015-06-27 LAB — PROCALCITONIN: PROCALCITONIN: 0.67 ng/mL

## 2015-06-27 LAB — LACTIC ACID, PLASMA: LACTIC ACID, VENOUS: 1.9 mmol/L (ref 0.5–2.0)

## 2015-06-27 MED ORDER — INSULIN ASPART PROT & ASPART (70-30 MIX) 100 UNIT/ML ~~LOC~~ SUSP
20.0000 [IU] | Freq: Two times a day (BID) | SUBCUTANEOUS | Status: DC
  Administered 2015-06-27 – 2015-07-01 (×8): 20 [IU] via SUBCUTANEOUS
  Filled 2015-06-27 (×2): qty 10

## 2015-06-27 MED ORDER — IBUPROFEN 400 MG PO TABS
400.0000 mg | ORAL_TABLET | Freq: Once | ORAL | Status: AC
  Administered 2015-06-27: 400 mg via ORAL
  Filled 2015-06-27: qty 1

## 2015-06-27 MED ORDER — POTASSIUM CHLORIDE CRYS ER 20 MEQ PO TBCR
40.0000 meq | EXTENDED_RELEASE_TABLET | Freq: Once | ORAL | Status: AC
  Administered 2015-06-27: 40 meq via ORAL
  Filled 2015-06-27: qty 2

## 2015-06-27 MED ORDER — SODIUM CHLORIDE 0.9 % IV BOLUS (SEPSIS)
500.0000 mL | Freq: Once | INTRAVENOUS | Status: AC
  Administered 2015-06-27: 500 mL via INTRAVENOUS

## 2015-06-27 NOTE — Progress Notes (Signed)
Brandy Lynn is a 49 y.o. female patient admitted from ED awake, alert - oriented  X 4 - no acute distress noted.  VSS - BP 162/61, HR 132, Temp 103, O2 100% on room air.   IV in place, occlusive dsg intact without redness.  Orientation to room, and floor completed with information packet given to patient/family.  Patient declined safety video at this time.  Admission INP armband ID verified with patient/family, and in place.   SR up x 2, fall assessment complete, with patient and family able to verbalize understanding of risk associated with falls, and verbalized understanding to call nsg before up out of bed.  Call light within reach, patient able to voice, and demonstrate understanding.  Skin, clean-dry- intact without evidence of bruising, or skin tears.   No evidence of skin break down noted on exam.     Will continue to evaluate and treat per MD orders.  Elon Jester, RN 06/25/1025  2218

## 2015-06-27 NOTE — Progress Notes (Signed)
Inpatient Diabetes Program Recommendations  AACE/ADA: New Consensus Statement on Inpatient Glycemic Control (2015)  Target Ranges:  Prepandial:   less than 140 mg/dL      Peak postprandial:   less than 180 mg/dL (1-2 hours)      Critically ill patients:  140 - 180 mg/dL   Review of Glycemic Control  Diabetes history: DM 2 Outpatient Diabetes medications: Glipizide 10 mg BID, Metformin 1,000 mg BID Current orders for Inpatient glycemic control: Novolog Sensitive TID  Inpatient Diabetes Program Recommendations: Insulin - Basal: Patient on 2 oral meds BID at home. Please consider starting low dose basal, Levemir 10 units Q24hrs.   Thanks,  Tama Headings RN, MSN, [facility] Inpatient Diabetes Coordinator Team Pager [phone] (8a-5p)

## 2015-06-27 NOTE — Care Management Note (Signed)
Case Management Note  Patient Details  Name: Brandy Lynn MRN: [id number] Date of Birth: 15-Mar-1966  Subjective/Objective:     Date: 06/27/15 Spoke with patient at the bedside along with son. Introduced self as Tourist information centre manager and explained role in discharge planning and how to be reached.  Verified patient lives in [place], with spouse.   Expressed potential need for no other DME.  Verified patient anticipates to go home with family, at time of discharge and will have full-time supervision by family at this time to best of their knowledge.  Patient denied needing help with their medication.  Patient drives to MD appointments.  Verified patient has PCP Hubbard Robinson.   Plan: CM will continue to follow for discharge planning and [facility] resources.                Action/Plan:   Expected Discharge Date:                  Expected Discharge Plan:  Home/Self Care  In-House Referral:     Discharge planning Services  CM Consult  Post Acute Care Choice:    Choice offered to:     DME Arranged:    DME Agency:     HH Arranged:    HH Agency:     Status of Service:  In process, will continue to follow  Medicare Important Message Given:    Date Medicare IM Given:    Medicare IM give by:    Date Additional Medicare IM Given:    Additional Medicare Important Message give by:     If discussed at [place] of Stay Meetings, dates discussed:    Additional Comments:  Zenon Mayo, RN 06/27/2015, 2:07 PM

## 2015-06-27 NOTE — Progress Notes (Addendum)
Patient experienced a 9 beat run of Vtach.  MD paged   MD aware Patient denies associated symptoms Vitals are stable

## 2015-06-27 NOTE — Progress Notes (Signed)
[facility] PROGRESS NOTE  Dameisha Tschida OHY:[id number] DOB: 05/29/1966 DOA: 06/26/2015 PCP: Penni Bombard, PA  Assessment/Plan: Principal Problem:   Sepsis ([place]) - Improving on Rocephin - WBC trending down.  - blood culture shows no growth  Active Problems:   Pyelonephritis - Most likely causing principle problem  - We'll continue IV antibiotics until patient deferveses    Poorly controlled type 2 diabetes mellitus ([place]) - Not well controlled. Will add patient's home 70/30 novolog mix regimen - continue SSI and diabetic diet    Tachycardia - most likely due to active infection. Currently improving.    Nausea & vomiting - resolving continue supportive therapy  Code Status: full Family Communication: Discussed directly with patient Disposition Plan: Pending improvement in condition   Consultants:  None  Procedures:  None  Antibiotics:  Rocephin  HPI/Subjective: Pt has no new complaints. No acute issues reported overnight.  Objective: Filed Vitals:   06/27/15 1347  BP: 146/80  Pulse: 105  Temp:   Resp: 19    Intake/Output Summary (Last 24 hours) at 06/27/15 1428 Last data filed at 06/27/15 1224  Gross per 24 hour  Intake 2739.5 ml  Output    650 ml  Net 2089.5 ml   Filed Weights   06/27/15 0756  Weight: 83.1 kg (183 lb 3.2 oz)    Exam:   General:  Pt in nad, alert nad awake  Cardiovascular: rrr, no mrg  Respiratory: cta bl, no wheezes  Abdomen: soft, ND, NT  Musculoskeletal: no cyanosis or clubbing   Data Reviewed: Basic Metabolic Panel:  Recent Labs Lab 06/26/15 1642 06/27/15 0230  NA 137 139  K 3.6 3.2*  CL 98* 103  CO2 24 27  GLUCOSE 199* 296*  BUN 6 <5*  CREATININE 0.74 0.71  CALCIUM 9.3 8.2*   Liver Function Tests:  Recent Labs Lab 06/26/15 1642 06/27/15 0230  AST 25 34  ALT 24 28  ALKPHOS 85 75  BILITOT 0.7 0.4  PROT 7.3 6.4*  ALBUMIN 3.6 2.8*    Recent Labs Lab 06/26/15 1642  LIPASE 19    No results for input(s): AMMONIA in the last 168 hours. CBC:  Recent Labs Lab 06/26/15 1642 06/27/15 0230  WBC 12.6* 9.2  NEUTROABS  --  7.2  HGB 11.4* 9.9*  HCT 33.8* 29.7*  MCV 79.3 80.1  PLT 197 162   Cardiac Enzymes: No results for input(s): CKTOTAL, CKMB, CKMBINDEX, TROPONINI in the last 168 hours. BNP (last 3 results) No results for input(s): BNP in the last 8760 hours.  ProBNP (last 3 results) No results for input(s): PROBNP in the last 8760 hours.  CBG:  Recent Labs Lab 06/26/15 2107 06/27/15 0755 06/27/15 1234  GLUCAP 164* 227* 190*    Recent Results (from the past 240 hour(s))  Blood culture (routine x 2)     Status: None (Preliminary result)   Collection Time: 06/26/15  5:43 PM  Result Value Ref Range Status   Specimen Description BLOOD LEFT ANTECUBITAL  Final   Special Requests BOTTLES DRAWN AEROBIC AND ANAEROBIC 5ML  Final   Culture NO GROWTH < 24 HOURS  Final   Report Status PENDING  Incomplete  Blood culture (routine x 2)     Status: None (Preliminary result)   Collection Time: 06/26/15  6:05 PM  Result Value Ref Range Status   Specimen Description BLOOD LEFT ARM  Final   Special Requests IN PEDIATRIC BOTTLE 5CC  Final   Culture NO GROWTH < 24 HOURS  Final   Report Status PENDING  Incomplete  Urine culture     Status: None (Preliminary result)   Collection Time: 06/26/15  6:48 PM  Result Value Ref Range Status   Specimen Description URINE, CLEAN CATCH  Final   Special Requests NONE  Final   Culture NO GROWTH < 24 HOURS  Final   Report Status PENDING  Incomplete     Studies: Dg Chest Port 1 View  06/26/2015  CLINICAL DATA:  Fever ear.  Sepsis. EXAM: PORTABLE CHEST 1 VIEW COMPARISON:  12/30/2014. FINDINGS: The cardiac silhouette, mediastinal and hilar contours are within normal limits and stable. Low lung volumes with vascular crowding and streaky basilar atelectasis and mild stable eventration right hemidiaphragm. No definite infiltrates or  effusions. The bony thorax is intact. IMPRESSION: Low lung volumes with vascular crowding and basilar atelectasis. No definite infiltrates or effusions. Electronically Signed   By: Marijo Sanes M.D.   On: 06/26/2015 23:03    Scheduled Meds: . aspirin EC  81 mg Oral Daily  . cefTRIAXone (ROCEPHIN)  IV  1 g Intravenous Q24H  . enoxaparin (LOVENOX) injection  40 mg Subcutaneous Q24H  . insulin aspart  0-9 Units Subcutaneous TID WC  . lisinopril  10 mg Oral Daily  . simvastatin  10 mg Oral QHS  . sodium chloride  3 mL Intravenous Q12H   Continuous Infusions: . sodium chloride 125 mL/hr at 06/27/15 1224   Time spent: > 25 minutes  [name]  [facility] Pager [phone] If 7PM-7AM, please contact night-coverage at [email or web address], password [facility] 06/27/2015, 2:28 PM

## 2015-06-28 LAB — HEMOGLOBIN A1C
HEMOGLOBIN A1C: 8.5 % — AB (ref 4.8–5.6)
MEAN PLASMA GLUCOSE: 197 mg/dL

## 2015-06-28 LAB — GLUCOSE, CAPILLARY
GLUCOSE-CAPILLARY: 129 mg/dL — AB (ref 65–99)
GLUCOSE-CAPILLARY: 168 mg/dL — AB (ref 65–99)
GLUCOSE-CAPILLARY: 172 mg/dL — AB (ref 65–99)
GLUCOSE-CAPILLARY: 153 mg/dL — AB (ref 65–99)

## 2015-06-28 LAB — URINE CULTURE

## 2015-06-28 MED ORDER — VANCOMYCIN HCL IN DEXTROSE 1-5 GM/200ML-% IV SOLN
1000.0000 mg | Freq: Three times a day (TID) | INTRAVENOUS | Status: DC
  Administered 2015-06-28 – 2015-06-30 (×7): 1000 mg via INTRAVENOUS
  Filled 2015-06-28 (×9): qty 200

## 2015-06-28 MED ORDER — PIPERACILLIN-TAZOBACTAM 3.375 G IVPB
3.3750 g | Freq: Three times a day (TID) | INTRAVENOUS | Status: DC
  Administered 2015-06-28 – 2015-06-30 (×6): 3.375 g via INTRAVENOUS
  Filled 2015-06-28 (×8): qty 50

## 2015-06-28 MED ORDER — LEVALBUTEROL HCL 0.63 MG/3ML IN NEBU: 0.6300 mg | INHALATION_SOLUTION | Freq: Four times a day (QID) | RESPIRATORY_TRACT | Status: DC | PRN

## 2015-06-28 MED ORDER — PIPERACILLIN-TAZOBACTAM 3.375 G IVPB 30 MIN
3.3750 g | Freq: Once | INTRAVENOUS | Status: AC
  Administered 2015-06-28: 3.375 g via INTRAVENOUS
  Filled 2015-06-28: qty 50

## 2015-06-28 MED ORDER — GUAIFENESIN-DM 100-10 MG/5ML PO SYRP
5.0000 mL | ORAL_SOLUTION | ORAL | Status: DC | PRN
  Administered 2015-06-28: 5 mL via ORAL
  Filled 2015-06-28: qty 5

## 2015-06-28 NOTE — Progress Notes (Signed)
Pt c/o SOB and congestion. O2 sats reading 89% RA. Placed on 2L [place]. Rogue Bussing, NP paged. Awaiting further orders. Will continue to monitor pt.

## 2015-06-28 NOTE — Progress Notes (Signed)
ANTIBIOTIC CONSULT NOTE - INITIAL  Pharmacy Consult for Vancomycin and Zosyn Indication: rule out sepsis  Allergies  Allergen Reactions  . Shellfish Allergy Itching    Patient Measurements: Height: 5\' 4"  (162.6 cm) Weight: 183 lb 3.2 oz (83.1 kg) IBW/kg (Calculated) : 54.7 Adjusted Body Weight:   Vital Signs: Temp: 100.9 F (38.3 C) (11/10 0631) Temp Source: Rectal (11/10 0631) BP: 169/84 mmHg (11/10 0500) Pulse Rate: 110 (11/10 0500) Intake/Output from previous day: 11/09 0701 - 11/10 0700 In: 2089.8 [P.O.:594; I.V.:1495.8] Out: 2150 [Urine:2150] Intake/Output from this shift: Total I/O In: 1887.9 [P.O.:240; I.V.:1647.9] Out: -   Labs:  Recent Labs  06/26/15 1642 06/27/15 0230  WBC 12.6* 9.2  HGB 11.4* 9.9*  PLT 197 162  CREATININE 0.74 0.71   Estimated Creatinine Clearance: 88.8 mL/min (by C-G formula based on Cr of 0.71). No results for input(s): VANCOTROUGH, VANCOPEAK, VANCORANDOM, GENTTROUGH, GENTPEAK, GENTRANDOM, TOBRATROUGH, TOBRAPEAK, TOBRARND, AMIKACINPEAK, AMIKACINTROU, AMIKACIN in the last 72 hours.   Microbiology: Recent Results (from the past 720 hour(s))  Blood culture (routine x 2)     Status: None (Preliminary result)   Collection Time: 06/26/15  5:43 PM  Result Value Ref Range Status   Specimen Description BLOOD LEFT ANTECUBITAL  Final   Special Requests BOTTLES DRAWN AEROBIC AND ANAEROBIC 5ML  Final   Culture NO GROWTH < 24 HOURS  Final   Report Status PENDING  Incomplete  Blood culture (routine x 2)     Status: None (Preliminary result)   Collection Time: 06/26/15  6:05 PM  Result Value Ref Range Status   Specimen Description BLOOD LEFT ARM  Final   Special Requests IN PEDIATRIC BOTTLE 5CC  Final   Culture NO GROWTH < 24 HOURS  Final   Report Status PENDING  Incomplete  Urine culture     Status: None   Collection Time: 06/26/15  6:48 PM  Result Value Ref Range Status   Specimen Description URINE, CLEAN CATCH  Final   Special Requests  NONE  Final   Culture MULTIPLE SPECIES PRESENT, SUGGEST RECOLLECTION  Final   Report Status 06/28/2015 FINAL  Final    Medical History: Past Medical History  Diagnosis Date  . HTN (hypertension) 11/12/2012  . Obesity 12/09/2010  . 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 NSAID and low-protein diet. Uric acid and CBC today.    . Hemoptysis, unspecified 12/2012, 12/2013  . Hyperlipidemia   . Type II diabetes mellitus ([place])   . Sickle cell trait ([place])   . Pyelonephritis     hx/notes 06/26/2015  . Sepsis ([place]) hospitalized 06/26/2015    Medications:  Scheduled:  . aspirin EC  81 mg Oral Daily  . enoxaparin (LOVENOX) injection  40 mg Subcutaneous Q24H  . insulin aspart  0-9 Units Subcutaneous TID WC  . insulin aspart protamine- aspart  20 Units Subcutaneous BID WC  . lisinopril  10 mg Oral Daily  . simvastatin  10 mg Oral QHS  . sodium chloride  3 mL Intravenous Q12H   Assessment: 49yo female admitted 11/8 and started on Rocephin for probable Pyelonephritis (fevers, UTI sx, flank pain).  Pt now to change to Vancomycin and Zosyn given continued fevers, Tm 103.1/Tc 100.9.   Cr 0.7, good UOP  CrCl ~90 11/8  Blood cx: ntd 11/8  Urine cx:  Mult sp, recollection recommended.  Goal of Therapy:  Vancomycin trough level 15-20 mcg/ml  Plan:  Zosyn 3.375g IV q8, first dose over 71min then further doses over 4hr Vancomycin  1000mg  IV q8 Watch renal fxn, f/u culture results VT at steady state  Gracy Bruins, PharmD [facility]

## 2015-06-28 NOTE — Progress Notes (Signed)
[facility] PROGRESS NOTE  Brandy Lynn [id number] DOB: Aug 19, 1965 DOA: 06/26/2015 PCP: Penni Bombard, PA  Assessment/Plan: Principal Problem:   Sepsis ([place]) - Will broaden antibiotic regimen today. - WBC trending down.  - blood culture shows no growth  Active Problems:   Pyelonephritis - Most likely causing principle problem  - We'll continue IV antibiotics until patient deferveses    Poorly controlled type 2 diabetes mellitus ([place]) - Not well controlled. Will continue patient's home 70/30 novolog mix regimen - continue SSI and diabetic diet    Tachycardia - most likely due to active infection.     Nausea & vomiting - resolving continue supportive therapy  Code Status: full Family Communication: Discussed directly with patient Disposition Plan: Pending improvement in condition   Consultants:  None  Procedures:  None  Antibiotics:  Rocephin>>>vanc/zosyn 06/28/15  HPI/Subjective: Pt has no new complaints. No acute issues reported overnight.  Objective: Filed Vitals:   06/28/15 0631  BP:   Pulse:   Temp: 100.9 F (38.3 C)  Resp:     Intake/Output Summary (Last 24 hours) at 06/28/15 1514 Last data filed at 06/28/15 1513  Gross per 24 hour  Intake 3829.08 ml  Output    800 ml  Net 3029.08 ml   Filed Weights   06/27/15 0756  Weight: 83.1 kg (183 lb 3.2 oz)    Exam:   General:  Pt in nad, alert nad awake  Cardiovascular: rrr, no mrg  Respiratory: cta bl, no wheezes  Abdomen: soft, ND, NT  Musculoskeletal: no cyanosis or clubbing   Data Reviewed: Basic Metabolic Panel:  Recent Labs Lab 06/26/15 1642 06/27/15 0230  NA 137 139  K 3.6 3.2*  CL 98* 103  CO2 24 27  GLUCOSE 199* 296*  BUN 6 <5*  CREATININE 0.74 0.71  CALCIUM 9.3 8.2*   Liver Function Tests:  Recent Labs Lab 06/26/15 1642 06/27/15 0230  AST 25 34  ALT 24 28  ALKPHOS 85 75  BILITOT 0.7 0.4  PROT 7.3 6.4*  ALBUMIN 3.6 2.8*    Recent  Labs Lab 06/26/15 1642  LIPASE 19   No results for input(s): AMMONIA in the last 168 hours. CBC:  Recent Labs Lab 06/26/15 1642 06/27/15 0230  WBC 12.6* 9.2  NEUTROABS  --  7.2  HGB 11.4* 9.9*  HCT 33.8* 29.7*  MCV 79.3 80.1  PLT 197 162   Cardiac Enzymes: No results for input(s): CKTOTAL, CKMB, CKMBINDEX, TROPONINI in the last 168 hours. BNP (last 3 results) No results for input(s): BNP in the last 8760 hours.  ProBNP (last 3 results) No results for input(s): PROBNP in the last 8760 hours.  CBG:  Recent Labs Lab 06/27/15 1234 06/27/15 1741 06/27/15 2127 06/28/15 0802 06/28/15 1214  GLUCAP 190* 162* 147* 129* 168*    Recent Results (from the past 240 hour(s))  Blood culture (routine x 2)     Status: None (Preliminary result)   Collection Time: 06/26/15  5:43 PM  Result Value Ref Range Status   Specimen Description BLOOD LEFT ANTECUBITAL  Final   Special Requests BOTTLES DRAWN AEROBIC AND ANAEROBIC 5ML  Final   Culture NO GROWTH 2 DAYS  Final   Report Status PENDING  Incomplete  Blood culture (routine x 2)     Status: None (Preliminary result)   Collection Time: 06/26/15  6:05 PM  Result Value Ref Range Status   Specimen Description BLOOD LEFT ARM  Final   Special Requests IN PEDIATRIC BOTTLE 5CC  Final   Culture NO GROWTH 2 DAYS  Final   Report Status PENDING  Incomplete  Urine culture     Status: None   Collection Time: 06/26/15  6:48 PM  Result Value Ref Range Status   Specimen Description URINE, CLEAN CATCH  Final   Special Requests NONE  Final   Culture MULTIPLE SPECIES PRESENT, SUGGEST RECOLLECTION  Final   Report Status 06/28/2015 FINAL  Final     Studies: Dg Chest Port 1 View  06/26/2015  CLINICAL DATA:  Fever ear.  Sepsis. EXAM: PORTABLE CHEST 1 VIEW COMPARISON:  12/30/2014. FINDINGS: The cardiac silhouette, mediastinal and hilar contours are within normal limits and stable. Low lung volumes with vascular crowding and streaky basilar  atelectasis and mild stable eventration right hemidiaphragm. No definite infiltrates or effusions. The bony thorax is intact. IMPRESSION: Low lung volumes with vascular crowding and basilar atelectasis. No definite infiltrates or effusions. Electronically Signed   By: Marijo Sanes M.D.   On: 06/26/2015 23:03    Scheduled Meds: . aspirin EC  81 mg Oral Daily  . enoxaparin (LOVENOX) injection  40 mg Subcutaneous Q24H  . insulin aspart  0-9 Units Subcutaneous TID WC  . insulin aspart protamine- aspart  20 Units Subcutaneous BID WC  . lisinopril  10 mg Oral Daily  . piperacillin-tazobactam (ZOSYN)  IV  3.375 g Intravenous 3 times per day  . simvastatin  10 mg Oral QHS  . sodium chloride  3 mL Intravenous Q12H  . vancomycin  1,000 mg Intravenous Q8H   Continuous Infusions: . sodium chloride 125 mL/hr at 06/28/15 1513   Time spent: > 25 minutes  [name]  [facility] Pager [phone] If 7PM-7AM, please contact night-coverage at [email or web address], password [facility] 06/28/2015, 3:14 PM  LOS: 1 day

## 2015-06-29 LAB — VANCOMYCIN, TROUGH: Vancomycin Tr: 14 ug/mL (ref 10.0–20.0)

## 2015-06-29 LAB — GLUCOSE, CAPILLARY
GLUCOSE-CAPILLARY: 182 mg/dL — AB (ref 65–99)
Glucose-Capillary: 150 mg/dL — ABNORMAL HIGH (ref 65–99)
Glucose-Capillary: 167 mg/dL — ABNORMAL HIGH (ref 65–99)
Glucose-Capillary: 148 mg/dL — ABNORMAL HIGH (ref 65–99)

## 2015-06-29 NOTE — Progress Notes (Signed)
ANTIBIOTIC CONSULT NOTE Pharmacy Consult for Vancomycin and Zosyn Indication: rule out sepsis  Allergies  Allergen Reactions  . Shellfish Allergy Itching   Labs:  Recent Labs  06/27/15 0230  WBC 9.2  HGB 9.9*  PLT 162  CREATININE 0.71   Estimated Creatinine Clearance: 88.8 mL/min (by C-G formula based on Cr of 0.71).  Recent Labs  06/29/15 2049  VANCOTROUGH 14      Assessment: 49yo female admitted 11/8 and started on Rocephin for probable Pyelonephritis (fevers, UTI sx, flank pain).   Continues on Vancomycin and Zosyn for pyelonephritis Vancomycin trough therapeutic at 14 Afebrile, cultures negative  Goal of Therapy:  Appropriate dosing  Plan:  Continue Zosyn 3.375g IV q 8 hours, each dose over 4 hours Continue Vancomycin  1000mg  IV q 8 hours Continue to follow  Thank you Anette Guarneri, PharmD [phone]

## 2015-06-29 NOTE — Progress Notes (Signed)
[facility] PROGRESS NOTE  Brandy Lynn [id number] DOB: 03-15-1966 DOA: 06/26/2015 PCP: Penni Bombard, PA  Assessment/Plan: Principal Problem:   Sepsis ([place]) - Continue broad spectrum antibiotics - WBC trending down.  - blood culture shows no growth  Active Problems:   Pyelonephritis - Most likely causing principle problem  - We'll continue IV antibiotics until patient deferveses - Urine culture non revealing    Poorly controlled type 2 diabetes mellitus ([place]) - improved control after continuing patient's home 70/30 novolog mix regimen - continue SSI and diabetic diet    Tachycardia - most likely due to active infection.     Nausea & vomiting - resolving continue supportive therapy  Code Status: full Family Communication: Discussed directly with patient Disposition Plan: Pending improvement in condition   Consultants:  None  Procedures:  None  Antibiotics:  Rocephin>>>vanc/zosyn 06/28/15  HPI/Subjective: Pt has no new complaints. No acute issues reported overnight.  Objective: Filed Vitals:   06/29/15 0730  BP:   Pulse:   Temp: 99 F (37.2 C)  Resp:     Intake/Output Summary (Last 24 hours) at 06/29/15 1005 Last data filed at 06/29/15 0948  Gross per 24 hour  Intake 4000.91 ml  Output      0 ml  Net 4000.91 ml   Filed Weights   06/27/15 0756  Weight: 83.1 kg (183 lb 3.2 oz)    Exam:   General:  Pt in nad, alert nad awake  Cardiovascular: rrr, no mrg  Respiratory: cta bl, no wheezes  Abdomen: soft, ND, NT  Musculoskeletal: no cyanosis or clubbing   Data Reviewed: Basic Metabolic Panel:  Recent Labs Lab 06/26/15 1642 06/27/15 0230  NA 137 139  K 3.6 3.2*  CL 98* 103  CO2 24 27  GLUCOSE 199* 296*  BUN 6 <5*  CREATININE 0.74 0.71  CALCIUM 9.3 8.2*   Liver Function Tests:  Recent Labs Lab 06/26/15 1642 06/27/15 0230  AST 25 34  ALT 24 28  ALKPHOS 85 75  BILITOT 0.7 0.4  PROT 7.3 6.4*  ALBUMIN 3.6  2.8*    Recent Labs Lab 06/26/15 1642  LIPASE 19   No results for input(s): AMMONIA in the last 168 hours. CBC:  Recent Labs Lab 06/26/15 1642 06/27/15 0230  WBC 12.6* 9.2  NEUTROABS  --  7.2  HGB 11.4* 9.9*  HCT 33.8* 29.7*  MCV 79.3 80.1  PLT 197 162   Cardiac Enzymes: No results for input(s): CKTOTAL, CKMB, CKMBINDEX, TROPONINI in the last 168 hours. BNP (last 3 results) No results for input(s): BNP in the last 8760 hours.  ProBNP (last 3 results) No results for input(s): PROBNP in the last 8760 hours.  CBG:  Recent Labs Lab 06/28/15 0802 06/28/15 1214 06/28/15 1653 06/28/15 2203 06/29/15 0744  GLUCAP 129* 168* 172* 153* 182*    Recent Results (from the past 240 hour(s))  Blood culture (routine x 2)     Status: None (Preliminary result)   Collection Time: 06/26/15  5:43 PM  Result Value Ref Range Status   Specimen Description BLOOD LEFT ANTECUBITAL  Final   Special Requests BOTTLES DRAWN AEROBIC AND ANAEROBIC 5ML  Final   Culture NO GROWTH 2 DAYS  Final   Report Status PENDING  Incomplete  Blood culture (routine x 2)     Status: None (Preliminary result)   Collection Time: 06/26/15  6:05 PM  Result Value Ref Range Status   Specimen Description BLOOD LEFT ARM  Final   Special  Requests IN PEDIATRIC BOTTLE 5CC  Final   Culture NO GROWTH 2 DAYS  Final   Report Status PENDING  Incomplete  Urine culture     Status: None   Collection Time: 06/26/15  6:48 PM  Result Value Ref Range Status   Specimen Description URINE, CLEAN CATCH  Final   Special Requests NONE  Final   Culture MULTIPLE SPECIES PRESENT, SUGGEST RECOLLECTION  Final   Report Status 06/28/2015 FINAL  Final     Studies: No results found.  Scheduled Meds: . aspirin EC  81 mg Oral Daily  . enoxaparin (LOVENOX) injection  40 mg Subcutaneous Q24H  . insulin aspart  0-9 Units Subcutaneous TID WC  . insulin aspart protamine- aspart  20 Units Subcutaneous BID WC  . lisinopril  10 mg Oral Daily   . piperacillin-tazobactam (ZOSYN)  IV  3.375 g Intravenous 3 times per day  . simvastatin  10 mg Oral QHS  . sodium chloride  3 mL Intravenous Q12H  . vancomycin  1,000 mg Intravenous Q8H   Continuous Infusions: . sodium chloride 125 mL/hr at 06/29/15 0446   Time spent: > 25 minutes  [name]  [facility] Pager [phone] If 7PM-7AM, please contact night-coverage at [email or web address], password [facility] 06/29/2015, 10:05 AM  LOS: 2 days

## 2015-06-30 DIAGNOSIS — E876 Hypokalemia: Secondary | ICD-10-CM

## 2015-06-30 LAB — BASIC METABOLIC PANEL
Anion gap: 12 (ref 5–15)
CO2: 29 mmol/L (ref 22–32)
CREATININE: 0.72 mg/dL (ref 0.44–1.00)
Calcium: 9.2 mg/dL (ref 8.9–10.3)
Chloride: 100 mmol/L — ABNORMAL LOW (ref 101–111)
GFR calc Af Amer: >60 mL/min (ref >60)
Glucose, Bld: 178 mg/dL — ABNORMAL HIGH (ref 65–99)
Potassium: 2.7 mmol/L — CL (ref 3.5–5.1)
SODIUM: 141 mmol/L (ref 135–145)

## 2015-06-30 LAB — GLUCOSE, CAPILLARY
GLUCOSE-CAPILLARY: 121 mg/dL — AB (ref 65–99)
Glucose-Capillary: 136 mg/dL — ABNORMAL HIGH (ref 65–99)
Glucose-Capillary: 138 mg/dL — ABNORMAL HIGH (ref 65–99)
Glucose-Capillary: 139 mg/dL — ABNORMAL HIGH (ref 65–99)

## 2015-06-30 LAB — CBC
HCT: 31.2 % — ABNORMAL LOW (ref 36.0–46.0)
Hemoglobin: 10.6 g/dL — ABNORMAL LOW (ref 12.0–15.0)
MCH: 27 pg (ref 26.0–34.0)
MCHC: 34 g/dL (ref 30.0–36.0)
MCV: 79.6 fL (ref 78.0–100.0)
PLATELETS: 227 10*3/uL (ref 150–400)
RBC: 3.92 MIL/uL (ref 3.87–5.11)
RDW: 14.4 % (ref 11.5–15.5)
WBC: 7.3 10*3/uL (ref 4.0–10.5)

## 2015-06-30 LAB — MAGNESIUM: MAGNESIUM: 1.9 mg/dL (ref 1.7–2.4)

## 2015-06-30 MED ORDER — POTASSIUM CHLORIDE 10 MEQ/100ML IV SOLN
10.0000 meq | INTRAVENOUS | Status: AC
  Administered 2015-06-30 (×3): 10 meq via INTRAVENOUS
  Filled 2015-06-30 (×3): qty 100

## 2015-06-30 MED ORDER — POTASSIUM CHLORIDE CRYS ER 20 MEQ PO TBCR
40.0000 meq | EXTENDED_RELEASE_TABLET | Freq: Once | ORAL | Status: AC
  Administered 2015-06-30: 40 meq via ORAL
  Filled 2015-06-30: qty 2

## 2015-06-30 MED ORDER — LEVOFLOXACIN 750 MG PO TABS
750.0000 mg | ORAL_TABLET | Freq: Every evening | ORAL | Status: DC
  Administered 2015-06-30: 750 mg via ORAL
  Filled 2015-06-30: qty 1

## 2015-06-30 NOTE — Progress Notes (Signed)
CRITICAL VALUE ALERT  Critical value received:  K=2.7  Date of notification:  06/30/2015  Time of notification:  [id number]  Critical value read back:Yes.    Nurse who received alert:  Alphonsus Sias  MD notified (1st page):  Dr. Wendee Beavers  Time of first page:  1535  MD notified (2nd page):  Time of second page:  Responding MD:  Dr. Wendee Beavers  Time MD responded:  [phone]

## 2015-06-30 NOTE — Progress Notes (Signed)
[facility] PROGRESS NOTE  Dorinne Wesby [id number] DOB: 1966/06/09 DOA: 06/26/2015 PCP: Penni Bombard, PA  Assessment/Plan: Principal Problem:   Sepsis (Uncertain) - Continue broad spectrum antibiotics - WBC WNL - sepsis physiology has resolved. - blood culture shows no growth  Active Problems:   Pyelonephritis - Most likely causing principle problem  - Transition to levaquin - Urine culture non revealing    Poorly controlled type 2 diabetes mellitus ([place]) - improved control after continuing patient's home 70/30 novolog mix regimen - continue SSI and diabetic diet  Hypokalemia - replace orally and IV - reassess next am - check magnesium levels.    Tachycardia - resolving    Nausea & vomiting - resolving continue supportive therapy  Code Status: full Family Communication: Discussed directly with patient Disposition Plan: Pending improvement in condition   Consultants:  None  Procedures:  None  Antibiotics:  Rocephin>>>vanc/zosyn 06/28/15>>>06/30/15 Levaquin  HPI/Subjective: Pt has felt nausea today. No other complaints otherwise.  Objective: Filed Vitals:   06/30/15 1341  BP: 169/84  Pulse: 97  Temp: 98.4 F (36.9 C)  Resp: 20    Intake/Output Summary (Last 24 hours) at 06/30/15 1535 Last data filed at 06/30/15 1356  Gross per 24 hour  Intake 2072.83 ml  Output    200 ml  Net 1872.83 ml   Filed Weights   06/27/15 0756  Weight: 83.1 kg (183 lb 3.2 oz)    Exam:   General:  Pt in nad, alert nad awake  Cardiovascular: rrr, no mrg  Respiratory: cta bl, no wheezes  Abdomen: soft, ND, NT  Musculoskeletal: no cyanosis or clubbing   Data Reviewed: Basic Metabolic Panel:  Recent Labs Lab 06/26/15 1642 06/27/15 0230 06/30/15 1453  NA 137 139 141  K 3.6 3.2* 2.7*  CL 98* 103 100*  CO2 24 27 29   GLUCOSE 199* 296* 178*  BUN 6 <5* <5*  CREATININE 0.74 0.71 0.72  CALCIUM 9.3 8.2* 9.2   Liver Function Tests:  Recent  Labs Lab 06/26/15 1642 06/27/15 0230  AST 25 34  ALT 24 28  ALKPHOS 85 75  BILITOT 0.7 0.4  PROT 7.3 6.4*  ALBUMIN 3.6 2.8*    Recent Labs Lab 06/26/15 1642  LIPASE 19   No results for input(s): AMMONIA in the last 168 hours. CBC:  Recent Labs Lab 06/26/15 1642 06/27/15 0230 06/30/15 1453  WBC 12.6* 9.2 7.3  NEUTROABS  --  7.2  --   HGB 11.4* 9.9* 10.6*  HCT 33.8* 29.7* 31.2*  MCV 79.3 80.1 79.6  PLT 197 162 227   Cardiac Enzymes: No results for input(s): CKTOTAL, CKMB, CKMBINDEX, TROPONINI in the last 168 hours. BNP (last 3 results) No results for input(s): BNP in the last 8760 hours.  ProBNP (last 3 results) No results for input(s): PROBNP in the last 8760 hours.  CBG:  Recent Labs Lab 06/29/15 1201 06/29/15 1647 06/29/15 2101 06/30/15 0804 06/30/15 1152  GLUCAP 150* 167* 148* 136* 121*    Recent Results (from the past 240 hour(s))  Blood culture (routine x 2)     Status: None (Preliminary result)   Collection Time: 06/26/15  5:43 PM  Result Value Ref Range Status   Specimen Description BLOOD LEFT ANTECUBITAL  Final   Special Requests BOTTLES DRAWN AEROBIC AND ANAEROBIC 5ML  Final   Culture NO GROWTH 4 DAYS  Final   Report Status PENDING  Incomplete  Blood culture (routine x 2)     Status: None (Preliminary result)  Collection Time: 06/26/15  6:05 PM  Result Value Ref Range Status   Specimen Description BLOOD LEFT ARM  Final   Special Requests IN PEDIATRIC BOTTLE 5CC  Final   Culture NO GROWTH 4 DAYS  Final   Report Status PENDING  Incomplete  Urine culture     Status: None   Collection Time: 06/26/15  6:48 PM  Result Value Ref Range Status   Specimen Description URINE, CLEAN CATCH  Final   Special Requests NONE  Final   Culture MULTIPLE SPECIES PRESENT, SUGGEST RECOLLECTION  Final   Report Status 06/28/2015 FINAL  Final     Studies: No results found.  Scheduled Meds: . aspirin EC  81 mg Oral Daily  . enoxaparin (LOVENOX) injection   40 mg Subcutaneous Q24H  . insulin aspart  0-9 Units Subcutaneous TID WC  . insulin aspart protamine- aspart  20 Units Subcutaneous BID WC  . lisinopril  10 mg Oral Daily  . piperacillin-tazobactam (ZOSYN)  IV  3.375 g Intravenous 3 times per day  . simvastatin  10 mg Oral QHS  . sodium chloride  3 mL Intravenous Q12H  . vancomycin  1,000 mg Intravenous Q8H   Continuous Infusions: . sodium chloride 125 mL/hr at 06/30/15 0239   Time spent: > 25 minutes  [name]  [facility] Pager [phone] If 7PM-7AM, please contact night-coverage at [email or web address], password [facility] 06/30/2015, 3:35 PM  LOS: 3 days

## 2015-06-30 NOTE — Progress Notes (Signed)
ANTIBIOTIC CONSULT NOTE - FOLLOW UP  Pharmacy Consult for Levaquin Indication: CAP  Allergies  Allergen Reactions  . Shellfish Allergy Itching    Patient Measurements: Height: 5\' 4"  (162.6 cm) Weight: 183 lb 3.2 oz (83.1 kg) IBW/kg (Calculated) : 54.7  Vital Signs: Temp: 98.4 F (36.9 C) (11/12 1341) Temp Source: Oral (11/12 0521) BP: 169/84 mmHg (11/12 1341) Pulse Rate: 97 (11/12 1341) Intake/Output from previous day: 11/11 0701 - 11/12 0700 In: 3515.5 [P.O.:600; I.V.:2315.5; IV [id number] Out: 1000 [Urine:1000] Intake/Output from this shift: Total I/O In: 462 [P.O.:462] Out: -   Labs:  Recent Labs  06/30/15 1453  WBC 7.3  HGB 10.6*  PLT 227  CREATININE 0.72   Estimated Creatinine Clearance: 88.8 mL/min (by C-G formula based on Cr of 0.72).  Recent Labs  06/29/15 2049  [facility] 14     Microbiology: Recent Results (from the past 720 hour(s))  Blood culture (routine x 2)     Status: None (Preliminary result)   Collection Time: 06/26/15  5:43 PM  Result Value Ref Range Status   Specimen Description BLOOD LEFT ANTECUBITAL  Final   Special Requests BOTTLES DRAWN AEROBIC AND ANAEROBIC 5ML  Final   Culture NO GROWTH 4 DAYS  Final   Report Status PENDING  Incomplete  Blood culture (routine x 2)     Status: None (Preliminary result)   Collection Time: 06/26/15  6:05 PM  Result Value Ref Range Status   Specimen Description BLOOD LEFT ARM  Final   Special Requests IN PEDIATRIC BOTTLE 5CC  Final   Culture NO GROWTH 4 DAYS  Final   Report Status PENDING  Incomplete  Urine culture     Status: None   Collection Time: 06/26/15  6:48 PM  Result Value Ref Range Status   Specimen Description URINE, CLEAN CATCH  Final   Special Requests NONE  Final   Culture MULTIPLE SPECIES PRESENT, SUGGEST RECOLLECTION  Final   Report Status 06/28/2015 FINAL  Final    Anti-infectives    Start     Dose/Rate Route Frequency Ordered Stop   06/30/15 1800  levofloxacin  (LEVAQUIN) tablet 750 mg     750 mg Oral Every evening 06/30/15 1558     06/28/15 2200  piperacillin-tazobactam (ZOSYN) IVPB 3.375 g  Status:  Discontinued     3.375 g 12.5 mL/hr over 240 Minutes Intravenous 3 times per day 06/28/15 1218 06/30/15 1536   06/28/15 1300  vancomycin (VANCOCIN) IVPB 1000 mg/200 mL premix  Status:  Discontinued     1,000 mg 200 mL/hr over 60 Minutes Intravenous Every 8 hours 06/28/15 1218 06/30/15 1536   06/28/15 1300  piperacillin-tazobactam (ZOSYN) IVPB 3.375 g     3.375 g 100 mL/hr over 30 Minutes Intravenous  Once 06/28/15 1218 06/28/15 1312   06/27/15 1800  cefTRIAXone (ROCEPHIN) 1 g in dextrose 5 % 50 mL IVPB  Status:  Discontinued     1 g 100 mL/hr over 30 Minutes Intravenous Every 24 hours 06/26/15 2224 06/28/15 1147   06/26/15 1915  cefTRIAXone (ROCEPHIN) 1 g in dextrose 5 % 50 mL IVPB     1 g 100 mL/hr over 30 Minutes Intravenous  Once 06/26/15 1909 06/26/15 2001   06/26/15 0000  sulfamethoxazole-trimethoprim (BACTRIM DS,SEPTRA DS) 800-160 MG tablet  Status:  Discontinued     1 tablet Oral 2 times daily 06/26/15 1727 06/26/15       Assessment: 49 y/o female transitioning to Levaquin from vancomycin and zosyn for CAP and  probable pyelonephritis. Renal function is normal. She is afebrile and WBC are normal.  Goal of Therapy:  Eradication of infection  Plan:  - Levaquin 750 mg PO daily - Recommend total 7-8 days antibiotics - Pharmacy signing off, please re-consult if needed  [facility], [place].D., BCPS Clinical Pharmacist Pager: [phone] 06/30/2015 3:59 PM

## 2015-07-01 DIAGNOSIS — A419 Sepsis, unspecified organism: Principal | ICD-10-CM

## 2015-07-01 DIAGNOSIS — E1165 Type 2 diabetes mellitus with hyperglycemia: Secondary | ICD-10-CM

## 2015-07-01 DIAGNOSIS — N12 Tubulo-interstitial nephritis, not specified as acute or chronic: Secondary | ICD-10-CM

## 2015-07-01 DIAGNOSIS — R112 Nausea with vomiting, unspecified: Secondary | ICD-10-CM

## 2015-07-01 LAB — BASIC METABOLIC PANEL
ANION GAP: 13 (ref 5–15)
BUN: 7 mg/dL (ref 6–20)
CALCIUM: 9.2 mg/dL (ref 8.9–10.3)
CO2: 26 mmol/L (ref 22–32)
Chloride: 101 mmol/L (ref 101–111)
Creatinine, Ser: 0.89 mg/dL (ref 0.44–1.00)
Glucose, Bld: 152 mg/dL — ABNORMAL HIGH (ref 65–99)
POTASSIUM: 3.1 mmol/L — AB (ref 3.5–5.1)
Sodium: 140 mmol/L (ref 135–145)

## 2015-07-01 LAB — IRON AND TIBC
Iron: 61 ug/dL (ref 28–170)
SATURATION RATIOS: 22 % (ref 10.4–31.8)
TIBC: 283 ug/dL (ref 250–450)
UIBC: 222 ug/dL

## 2015-07-01 LAB — GLUCOSE, CAPILLARY
Glucose-Capillary: 156 mg/dL — ABNORMAL HIGH (ref 65–99)
Glucose-Capillary: 126 mg/dL — ABNORMAL HIGH (ref 65–99)

## 2015-07-01 LAB — VITAMIN B12: Vitamin B-12: 330 pg/mL (ref 180–914)

## 2015-07-01 LAB — FERRITIN: Ferritin: 87 ng/mL (ref 11–307)

## 2015-07-01 LAB — CULTURE, BLOOD (ROUTINE X 2)
CULTURE: NO GROWTH
Culture: NO GROWTH

## 2015-07-01 MED ORDER — INSULIN ASPART PROT & ASPART (70-30 MIX) 100 UNIT/ML ~~LOC~~ SUSP: 20.0000 [IU] | Freq: Two times a day (BID) | SUBCUTANEOUS | Status: DC

## 2015-07-01 MED ORDER — LEVOFLOXACIN 750 MG PO TABS: 750.0000 mg | ORAL_TABLET | Freq: Every evening | ORAL | Status: DC

## 2015-07-01 MED ORDER — POTASSIUM CHLORIDE CRYS ER 20 MEQ PO TBCR
40.0000 meq | EXTENDED_RELEASE_TABLET | Freq: Once | ORAL | Status: AC
  Administered 2015-07-01: 40 meq via ORAL
  Filled 2015-07-01: qty 2

## 2015-07-01 MED ORDER — OXYCODONE-ACETAMINOPHEN 5-325 MG PO TABS: 1.0000 | ORAL_TABLET | ORAL | Status: DC | PRN

## 2015-07-01 MED ORDER — HYDRALAZINE HCL 20 MG/ML IJ SOLN
10.0000 mg | Freq: Four times a day (QID) | INTRAMUSCULAR | Status: DC | PRN
  Administered 2015-07-01: 10 mg via INTRAVENOUS
  Filled 2015-07-01: qty 1

## 2015-07-01 MED ORDER — LISINOPRIL 20 MG PO TABS: 20.0000 mg | ORAL_TABLET | Freq: Every day | ORAL | Status: DC

## 2015-07-01 NOTE — Progress Notes (Signed)
Patient was discharged home by MD order; discharged instructions review and give to patient with care notes and prescriptions; IV DIC; skin intact; patient will be escorted to the car by nurse tech via wheelchair.  

## 2015-07-01 NOTE — Discharge Summary (Signed)
Physician Discharge Summary  Brandy Lynn [id number] DOB: [birth year] DOA: 06/26/2015  PCP: Penni Bombard, PA  Admit date: 06/26/2015 Discharge date: 07/01/2015  Recommendations for Outpatient Follow-up:  1. Pt will need to follow up with PCP in 2 weeks post discharge 2. Please obtain BMP in one week  Discharge Diagnoses:  Sepsis -Secondary to pyelonephritis -Initially started on ceftriaxone (11/8-11/10) then switched to zosyn (11/10-11/12) -Blood cultures negative to date -Urine culture unrevealing -Continue IV fluids-->saline locked -The patient was on Macrobid prior to admission which likely compromised cultures Pyelonephritis -Initially started on ceftriaxone(11/8-11/10) then switched to zosyn (11/10-11/12) -now stable on levofloxacin--home with 8 more days to complete 14 days of therapy Diabetes mellitus type 2, uncontrolled  -Patient reported that she was recently started on insulin by her primary care provider  -06/27/2015--Hemoglobin A1c--8.5 -Patient was restarted on 70/30 insulin--pt already has needles and syringes -CBGs  Well-controlled -glipizide discontinued -keep glycemic log with which to take to her primary care provider to further adjust her insulin regimen.  Microcytic anemia  -Iron saturation 22%, ferritin 87 -B12-330 -RBC folate pending  Hypokalemia  -Repleted -Magnesium 1.9  Hypertension -Lisinopril increased to 20 mg daily -BMP in one week  Discharge Condition: stable  Disposition: home  Diet:carb modified Wt Readings from Last 3 Encounters:  06/27/15 83.1 kg (183 lb 3.2 oz)  12/30/14 86.183 kg (190 lb)  04/18/14 86.909 kg (191 lb 9.6 oz)    History of present illness:  49 year old female with a history of diabetes mellitus type 2, uncontrolled presented with three-day history of dysuria, urgency, right flank pain with associated nausea and vomiting. The patient was seen in her primary care provider's office, [facility] medical--  Hubbard Robinson, PA-C on 06/25/2015 and was prescribed Macrobid. The patient had a CT of the abdomen and pelvis at an outside facility, but those results are unavailable at the time of admission. She was found to have fever up to 101.29F, tachycardia with heart rate of 140, and leukocytosis with associated elevated lactic acid. She was fluid resuscitated and started on intravenous ceftriaxone. She was then switched to Zosyn. Ultimately, she was transitioned to oral levofloxacin without any difficulty. The patient defervesced and remained hemodynamically stable. Because of her elevated CBGs, the patient was started back on her 70/30 insulin. Her CBGs remained well-controlled. The patient will be discharged home with her previous 70/30 insulin regimen, 20 units twice a day. She was instructed to discontinue glipizide. She was instructed to keep a glycemic log with which to take to her primary care provider for further adjustments of her insulin regimen.   Discharge Exam: Filed Vitals:   07/01/15 0653  BP: 155/71  Pulse: 86  Temp:   Resp: 18   Filed Vitals:   06/30/15 2123 07/01/15 0512 07/01/15 0551 07/01/15 0653  BP: 164/80 167/81 166/72 155/71  Pulse: 91 83 82 86  Temp: 99.3 F (37.4 C) 98.4 F (36.9 C)    TempSrc: Oral Oral    Resp: 20 18 20 18   Height:      Weight:      SpO2: 98% 94%  98%   General: A&O x 3, NAD, pleasant, cooperative Cardiovascular: RRR, no rub, no gallop, no S3 Respiratory: CTAB, no wheeze, no rhonchi Abdomen:soft, nontender, nondistended, positive bowel sounds Extremities: No edema, No lymphangitis, no petechiae  Discharge Instructions     Medication List    STOP taking these medications        glipiZIDE 10 MG tablet  Commonly known as:  GLUCOTROL     methocarbamol 500 MG tablet  Commonly known as:  ROBAXIN     nitrofurantoin (macrocrystal-monohydrate) 100 MG capsule  Commonly known as:  MACROBID      TAKE these medications        aspirin EC 81 MG  tablet  Take 81 mg by mouth daily.     BYDUREON 2 MG Pen  Generic drug:  Exenatide ER  Inject 2 mg into the skin once a week.     insulin aspart protamine- aspart (70-30) 100 UNIT/ML injection  Commonly known as:  NOVOLOG MIX 70/30  Inject 0.2 mLs (20 Units total) into the skin 2 (two) times daily with a meal.     levofloxacin 750 MG tablet  Commonly known as:  LEVAQUIN  Take 1 tablet (750 mg total) by mouth every evening.     lisinopril 20 MG tablet  Commonly known as:  PRINIVIL,ZESTRIL  Take 1 tablet (20 mg total) by mouth daily.     metFORMIN 1000 MG tablet  Commonly known as:  GLUCOPHAGE  Take 1,000 mg by mouth 2 (two) times daily with a meal.     oxyCODONE-acetaminophen 5-325 MG tablet  Commonly known as:  PERCOCET/ROXICET  Take 1 tablet by mouth every 4 (four) hours as needed for moderate pain or severe pain.     simvastatin 10 MG tablet  Commonly known as:  ZOCOR  Take 10 mg by mouth at bedtime.     Vitamin D (Ergocalciferol) 50000 UNITS Caps capsule  Commonly known as:  DRISDOL  Take 50,000 Units by mouth every 7 (seven) days.         The results of significant diagnostics from this hospitalization (including imaging, microbiology, ancillary and laboratory) are listed below for reference.    Significant Diagnostic Studies: Dg Chest Port 1 View  06/26/2015  CLINICAL DATA:  Fever ear.  Sepsis. EXAM: PORTABLE CHEST 1 VIEW COMPARISON:  12/30/2014. FINDINGS: The cardiac silhouette, mediastinal and hilar contours are within normal limits and stable. Low lung volumes with vascular crowding and streaky basilar atelectasis and mild stable eventration right hemidiaphragm. No definite infiltrates or effusions. The bony thorax is intact. IMPRESSION: Low lung volumes with vascular crowding and basilar atelectasis. No definite infiltrates or effusions. Electronically Signed   By: Marijo Sanes M.D.   On: 06/26/2015 23:03     Microbiology: Recent Results (from the past 240  hour(s))  Blood culture (routine x 2)     Status: None (Preliminary result)   Collection Time: 06/26/15  5:43 PM  Result Value Ref Range Status   Specimen Description BLOOD LEFT ANTECUBITAL  Final   Special Requests BOTTLES DRAWN AEROBIC AND ANAEROBIC 5ML  Final   Culture NO GROWTH 4 DAYS  Final   Report Status PENDING  Incomplete  Blood culture (routine x 2)     Status: None (Preliminary result)   Collection Time: 06/26/15  6:05 PM  Result Value Ref Range Status   Specimen Description BLOOD LEFT ARM  Final   Special Requests IN PEDIATRIC BOTTLE 5CC  Final   Culture NO GROWTH 4 DAYS  Final   Report Status PENDING  Incomplete  Urine culture     Status: None   Collection Time: 06/26/15  6:48 PM  Result Value Ref Range Status   Specimen Description URINE, CLEAN CATCH  Final   Special Requests NONE  Final   Culture MULTIPLE SPECIES PRESENT, SUGGEST RECOLLECTION  Final   Report Status 06/28/2015 FINAL  Final  Labs: Basic Metabolic Panel:  Recent Labs Lab 06/26/15 1642 06/27/15 0230 06/30/15 1453 06/30/15 1600 07/01/15 0840  NA 137 139 141  --  140  K 3.6 3.2* 2.7*  --  3.1*  CL 98* 103 100*  --  101  CO2 24 27 29   --  26  GLUCOSE 199* 296* 178*  --  152*  BUN 6 <5* <5*  --  7  CREATININE 0.74 0.71 0.72  --  0.89  CALCIUM 9.3 8.2* 9.2  --  9.2  MG  --   --   --  1.9  --    Liver Function Tests:  Recent Labs Lab 06/26/15 1642 06/27/15 0230  AST 25 34  ALT 24 28  ALKPHOS 85 75  BILITOT 0.7 0.4  PROT 7.3 6.4*  ALBUMIN 3.6 2.8*    Recent Labs Lab 06/26/15 1642  LIPASE 19   No results for input(s): AMMONIA in the last 168 hours. CBC:  Recent Labs Lab 06/26/15 1642 06/27/15 0230 06/30/15 1453  WBC 12.6* 9.2 7.3  NEUTROABS  --  7.2  --   HGB 11.4* 9.9* 10.6*  HCT 33.8* 29.7* 31.2*  MCV 79.3 80.1 79.6  PLT 197 162 227   Cardiac Enzymes: No results for input(s): CKTOTAL, CKMB, CKMBINDEX, TROPONINI in the last 168 hours. BNP: Invalid input(s):  POCBNP CBG:  Recent Labs Lab 06/30/15 0804 06/30/15 1152 06/30/15 1654 06/30/15 2120 07/01/15 0741  GLUCAP 136* 121* 138* 139* 156*    Time coordinating discharge:  Greater than 30 minutes  Signed:  Chadwick Reiswig, DO [facility] Pager: [phone] 07/01/2015, 10:48 AM

## 2015-07-02 LAB — FOLATE RBC
FOLATE, HEMOLYSATE: 359.2 ng/mL
Folate, RBC: 1197 ng/mL (ref >498)
Hematocrit: 30 % — ABNORMAL LOW (ref 34.0–46.6)

## 2015-12-16 ENCOUNTER — Encounter (HOSPITAL_BASED_OUTPATIENT_CLINIC_OR_DEPARTMENT_OTHER): Payer: Self-pay | Admitting: *Deleted

## 2015-12-16 ENCOUNTER — Emergency Department (HOSPITAL_BASED_OUTPATIENT_CLINIC_OR_DEPARTMENT_OTHER)
Admission: EM | Admit: 2015-12-16 | Discharge: 2015-12-16 | Disposition: A | Discharge status: Home / Self Care | Payer: BLUE CROSS/BLUE SHIELD | Attending: Emergency Medicine | Admitting: Emergency Medicine

## 2015-12-16 ENCOUNTER — Emergency Department (HOSPITAL_BASED_OUTPATIENT_CLINIC_OR_DEPARTMENT_OTHER): Payer: BLUE CROSS/BLUE SHIELD

## 2015-12-16 DIAGNOSIS — R0789 Other chest pain: Secondary | ICD-10-CM | POA: Diagnosis not present

## 2015-12-16 DIAGNOSIS — I10 Essential (primary) hypertension: Secondary | ICD-10-CM | POA: Insufficient documentation

## 2015-12-16 DIAGNOSIS — Z794 Long term (current) use of insulin: Secondary | ICD-10-CM | POA: Insufficient documentation

## 2015-12-16 DIAGNOSIS — E669 Obesity, unspecified: Secondary | ICD-10-CM | POA: Diagnosis not present

## 2015-12-16 DIAGNOSIS — E119 Type 2 diabetes mellitus without complications: Secondary | ICD-10-CM | POA: Insufficient documentation

## 2015-12-16 DIAGNOSIS — R072 Precordial pain: Secondary | ICD-10-CM | POA: Diagnosis present

## 2015-12-16 DIAGNOSIS — Z7982 Long term (current) use of aspirin: Secondary | ICD-10-CM | POA: Insufficient documentation

## 2015-12-16 DIAGNOSIS — Z79899 Other long term (current) drug therapy: Secondary | ICD-10-CM | POA: Diagnosis not present

## 2015-12-16 DIAGNOSIS — E785 Hyperlipidemia, unspecified: Secondary | ICD-10-CM | POA: Insufficient documentation

## 2015-12-16 LAB — CBC
HEMATOCRIT: 33.8 % — AB (ref 36.0–46.0)
HEMOGLOBIN: 11.9 g/dL — AB (ref 12.0–15.0)
MCH: 29.8 pg (ref 26.0–34.0)
MCHC: 35.2 g/dL (ref 30.0–36.0)
MCV: 84.5 fL (ref 78.0–100.0)
Platelets: 233 10*3/uL (ref 150–400)
RBC: 4 MIL/uL (ref 3.87–5.11)
RDW: 12.3 % (ref 11.5–15.5)
WBC: 5.9 10*3/uL (ref 4.0–10.5)

## 2015-12-16 LAB — BASIC METABOLIC PANEL
ANION GAP: 10 (ref 5–15)
BUN: 13 mg/dL (ref 6–20)
CALCIUM: 9.3 mg/dL (ref 8.9–10.3)
CHLORIDE: 107 mmol/L (ref 101–111)
CO2: 25 mmol/L (ref 22–32)
Creatinine, Ser: 0.61 mg/dL (ref 0.44–1.00)
GFR calc Af Amer: >60 mL/min (ref >60)
GLUCOSE: 98 mg/dL (ref 65–99)
POTASSIUM: 3.9 mmol/L (ref 3.5–5.1)
SODIUM: 142 mmol/L (ref 135–145)

## 2015-12-16 LAB — TROPONIN I

## 2015-12-16 LAB — CBG MONITORING, ED: GLUCOSE-CAPILLARY: 91 mg/dL (ref 65–99)

## 2015-12-16 NOTE — ED Notes (Signed)
Patient c/o mid chest pain & SOB that started around 12 pm.

## 2015-12-16 NOTE — ED Notes (Signed)
Tolerating soda and snack, VSS.

## 2015-12-16 NOTE — ED Notes (Signed)
Pt alert, NAD,calm, interactive, resps e/u, speaking in clear complete sentences, no dyspnea noted. Updated. Up to b/r steady gait. (Denies: pain, sob, HA, dizziness, nausea or other sx), states,  "feel better".

## 2015-12-16 NOTE — ED Notes (Signed)
PA at bedside.

## 2015-12-16 NOTE — Discharge Instructions (Signed)
There does not appear to be an emergent cause for your symptoms at this time. Your exam, labs, EKG and chest x-ray are all reassuring. Follow-up with your doctor next week for reevaluation. Return to ED for any new or worsening symptoms as we discussed.  Chest Wall Pain Chest wall pain is pain in or around the bones and muscles of your chest. Sometimes, an injury causes this pain. Sometimes, the cause may not be known. This pain may take several weeks or longer to get better. HOME CARE INSTRUCTIONS  Pay attention to any changes in your symptoms. Take these actions to help with your pain:   Rest as told by your health care provider.   Avoid activities that cause pain. These include any activities that use your chest muscles or your abdominal and side muscles to lift heavy items.   If directed, apply ice to the painful area:  Put ice in a plastic bag.  Place a towel between your skin and the bag.  Leave the ice on for 20 minutes, 2-3 times per day.  Take over-the-counter and prescription medicines only as told by your health care provider.  Do not use tobacco products, including cigarettes, chewing tobacco, and e-cigarettes. If you need help quitting, ask your health care provider.  Keep all follow-up visits as told by your health care provider. This is important. SEEK MEDICAL CARE IF:  You have a fever.  Your chest pain becomes worse.  You have new symptoms. SEEK IMMEDIATE MEDICAL CARE IF:  You have nausea or vomiting.  You feel sweaty or light-headed.  You have a cough with phlegm (sputum) or you cough up blood.  You develop shortness of breath.   This information is not intended to replace advice given to you by your health care provider. Make sure you discuss any questions you have with your health care provider.   Document Released: 08/04/2005 Document Revised: 04/25/2015 Document Reviewed: 10/30/2014 Elsevier Interactive Patient Education [employer].

## 2015-12-16 NOTE — ED Notes (Signed)
Pt given instructions. Verbalizes understanding. No questions.

## 2015-12-16 NOTE — ED Provider Notes (Signed)
CSN: [id number]     Arrival date & time 12/16/15  1611 History   First MD Initiated Contact with Patient 12/16/15 1737     Chief Complaint  Patient presents with  . Chest Pain     (Consider location/radiation/quality/duration/timing/severity/associated sxs/prior Treatment) HPI Janaysia Bowlby is a 50 y.o. female history of hypertension, type 2 diabetes, comes in for evaluation of chest discomfort. Patient reports at approximately 12:00 PM this afternoon she was having coffee and experienced right-sided chest pressure. She reports this discomfort is exacerbated when she moves and tries to lean forward or twist her body and results and sharp pressure that radiates from her neck into her right chest. She denies any discomfort now in the ED. Symptoms lasted approximately 2 hours. No fevers, chills, shortness of breath, nausea or vomiting, diaphoresis, numbness or weakness. Nothing tried to improve symptoms. Nothing makes problem better or worse. No other modifying factors.  Past Medical History  Diagnosis Date  . HTN (hypertension) 11/12/2012  . Obesity 12/09/2010  . 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 NSAID and low-protein diet. Uric acid and CBC today.    . Hemoptysis, unspecified 12/2012, 12/2013  . Hyperlipidemia   . Type II diabetes mellitus ([place])   . Sickle cell trait ([place])   . Pyelonephritis     hx/notes 06/26/2015  . Sepsis [facility]) hospitalized 06/26/2015   Past Surgical History  Procedure Laterality Date  . Tubal ligation  2006   Family History  Problem Relation Age of Onset  . Diabetes Father   . Diabetes Sister   . Diabetes Brother   . Breast cancer     Social History  Substance Use Topics  . Smoking status: Never Smoker   . Smokeless tobacco: Never Used  . Alcohol Use: No   OB History    No data available     Review of Systems A 10 point review of systems was completed and was  negative except for pertinent positives and negatives as mentioned in the history of present illness     Allergies  Shellfish allergy  Home Medications   Prior to Admission medications   Medication Sig Start Date End Date Taking? Authorizing Provider  aspirin EC 81 MG tablet Take 81 mg by mouth daily.    Historical Provider, MD  Exenatide ER (BYDUREON) 2 MG PEN Inject 2 mg into the skin once a week.    Historical Provider, MD  insulin aspart protamine- aspart (NOVOLOG MIX 70/30) (70-30) 100 UNIT/ML injection Inject 0.2 mLs (20 Units total) into the skin 2 (two) times daily with a meal. 07/01/15   Orson Eva, MD  levofloxacin (LEVAQUIN) 750 MG tablet Take 1 tablet (750 mg total) by mouth every evening. 07/01/15   Orson Eva, MD  lisinopril (PRINIVIL,ZESTRIL) 20 MG tablet Take 1 tablet (20 mg total) by mouth daily. 07/01/15   Orson Eva, MD  metFORMIN (GLUCOPHAGE) 1000 MG tablet Take 1,000 mg by mouth 2 (two) times daily with a meal.    Historical Provider, MD  oxyCODONE-acetaminophen (PERCOCET/ROXICET) 5-325 MG tablet Take 1 tablet by mouth every 4 (four) hours as needed for moderate pain or severe pain. 07/01/15   Orson Eva, MD  simvastatin (ZOCOR) 10 MG tablet Take 10 mg by mouth at bedtime.    Historical Provider, MD  Vitamin D, Ergocalciferol, (DRISDOL) 50000 UNITS CAPS capsule Take 50,000 Units by mouth every 7 (seven) days.  Historical Provider, MD   BP 136/70 mmHg  Pulse 86  Temp(Src) 98.4 F (36.9 C) (Oral)  Resp 20  Ht 5\' 4"  (1.626 m)  Wt 77.111 kg  BMI 29.17 kg/m2  SpO2 100% Physical Exam  Constitutional: She is oriented to person, place, and time. She appears well-developed and well-nourished.  Overall well-appearing African female  HENT:  Head: Normocephalic and atraumatic.  Mouth/Throat: Oropharynx is clear and moist.  Eyes: Conjunctivae are normal. Pupils are equal, round, and reactive to light. Right eye exhibits no discharge. Left eye exhibits no discharge. No  scleral icterus.  Neck: Normal range of motion. Neck supple.  Cardiovascular: Normal rate, regular rhythm and normal heart sounds.   Pulmonary/Chest: Effort normal and breath sounds normal. No respiratory distress. She has no wheezes. She has no rales. She exhibits tenderness.    Discomfort is replicated with palpation of right pectoralis musculature. No rash, tenting or crepitus.  Abdominal: Soft. There is no tenderness.  Musculoskeletal: She exhibits no tenderness.  Neurological: She is alert and oriented to person, place, and time.  Cranial Nerves II-XII grossly intact  Skin: Skin is warm and dry. No rash noted.  Psychiatric: She has a normal mood and affect.  Nursing note and vitals reviewed.   ED Course  Procedures (including critical care time) Labs Review Labs Reviewed  CBC - Abnormal; Notable for the following:    Hemoglobin 11.9 (*)    HCT 33.8 (*)    All other components within normal limits  BASIC METABOLIC PANEL  TROPONIN I  TROPONIN I  CBG MONITORING, ED    Imaging Review Dg Chest 2 View  12/16/2015  CLINICAL DATA:  Chest pain, shortness of breath EXAM: CHEST  2 VIEW COMPARISON:  06/26/2015 FINDINGS: The heart size and mediastinal contours are within normal limits. Both lungs are clear. The visualized skeletal structures are unremarkable. IMPRESSION: No active cardiopulmonary disease. Electronically Signed   By: Kathreen Devoid   On: 12/16/2015 18:09   I have personally reviewed and evaluated these images and lab results as part of my medical decision-making.   EKG Interpretation   Date/Time:  Sunday December 16 2015 16:18:55 EDT Ventricular Rate:  92 PR Interval:  124 QRS Duration: 76 QT Interval:  350 QTC Calculation: 432 R Axis:   45 Text Interpretation:  Normal sinus rhythm Normal ECG rate is slower  compared to Nov 2016 Confirmed by Regenia Skeeter  MD, [place] [phone]) on 12/16/2015  4:24:39 PM Also confirmed by Regenia Skeeter  MD, [place] ([id number]), editor Gilford Rile,  [place], [place]  [phone])  on 12/16/2015 4:33:10 PM      MDM  Idamae Etheredge is a 50 y.o. female history of hypertension, presents for evaluation of atypical chest pain. Patient denies any discomfort now emergency Department. Symptoms seem to be muscular skeletal nature as it is reproduced "exactly" on physical exam with palpation of right pectoralis major musculature. Heart score 2. PERC negative. Troponin is negative, EKG is unchanged from prior, chest x-ray is unremarkable. Screening labs also unremarkable. Patient remains asymptomatic at this time. Plan for delta troponin, if negative discharge to follow-up with PCP. Final diagnoses:  Chest wall pain        Comer Locket, PA-C 12/17/15 0106  Sherwood Gambler, MD 12/21/15 0025

## 2015-12-16 NOTE — ED Notes (Addendum)
Pt describes onset of midsternal CP after awakening this am. Constant-stabbing with movement. Goes from back to front. +SHOB BBS slightly diminished, but otherwise clr. No cough.

## 2016-08-23 ENCOUNTER — Encounter (HOSPITAL_BASED_OUTPATIENT_CLINIC_OR_DEPARTMENT_OTHER): Payer: Self-pay | Admitting: Emergency Medicine

## 2016-08-23 ENCOUNTER — Emergency Department (HOSPITAL_BASED_OUTPATIENT_CLINIC_OR_DEPARTMENT_OTHER)
Admission: EM | Admit: 2016-08-23 | Discharge: 2016-08-24 | Disposition: A | Discharge status: Home / Self Care | Payer: BLUE CROSS/BLUE SHIELD | Attending: Dermatology | Admitting: Dermatology

## 2016-08-23 DIAGNOSIS — Z7984 Long term (current) use of oral hypoglycemic drugs: Secondary | ICD-10-CM | POA: Diagnosis not present

## 2016-08-23 DIAGNOSIS — I10 Essential (primary) hypertension: Secondary | ICD-10-CM | POA: Insufficient documentation

## 2016-08-23 DIAGNOSIS — Z7982 Long term (current) use of aspirin: Secondary | ICD-10-CM | POA: Diagnosis not present

## 2016-08-23 DIAGNOSIS — Z5321 Procedure and treatment not carried out due to patient leaving prior to being seen by health care provider: Secondary | ICD-10-CM | POA: Diagnosis not present

## 2016-08-23 DIAGNOSIS — E119 Type 2 diabetes mellitus without complications: Secondary | ICD-10-CM | POA: Insufficient documentation

## 2016-08-23 DIAGNOSIS — R112 Nausea with vomiting, unspecified: Secondary | ICD-10-CM | POA: Diagnosis not present

## 2016-08-23 DIAGNOSIS — Z79899 Other long term (current) drug therapy: Secondary | ICD-10-CM | POA: Insufficient documentation

## 2016-08-23 NOTE — ED Triage Notes (Signed)
Pt reports vomiting each time after eating since this morning.

## 2016-12-08 ENCOUNTER — Encounter (HOSPITAL_BASED_OUTPATIENT_CLINIC_OR_DEPARTMENT_OTHER): Payer: Self-pay | Admitting: *Deleted

## 2016-12-08 ENCOUNTER — Emergency Department (HOSPITAL_BASED_OUTPATIENT_CLINIC_OR_DEPARTMENT_OTHER): Payer: BLUE CROSS/BLUE SHIELD

## 2016-12-08 ENCOUNTER — Emergency Department (HOSPITAL_BASED_OUTPATIENT_CLINIC_OR_DEPARTMENT_OTHER)
Admission: EM | Admit: 2016-12-08 | Discharge: 2016-12-09 | Disposition: A | Discharge status: Home / Self Care | Payer: BLUE CROSS/BLUE SHIELD | Attending: Emergency Medicine | Admitting: Emergency Medicine

## 2016-12-08 DIAGNOSIS — E119 Type 2 diabetes mellitus without complications: Secondary | ICD-10-CM | POA: Insufficient documentation

## 2016-12-08 DIAGNOSIS — Z7982 Long term (current) use of aspirin: Secondary | ICD-10-CM | POA: Diagnosis not present

## 2016-12-08 DIAGNOSIS — N3000 Acute cystitis without hematuria: Secondary | ICD-10-CM

## 2016-12-08 DIAGNOSIS — Z79899 Other long term (current) drug therapy: Secondary | ICD-10-CM | POA: Diagnosis not present

## 2016-12-08 DIAGNOSIS — I10 Essential (primary) hypertension: Secondary | ICD-10-CM | POA: Insufficient documentation

## 2016-12-08 DIAGNOSIS — R109 Unspecified abdominal pain: Secondary | ICD-10-CM

## 2016-12-08 DIAGNOSIS — R509 Fever, unspecified: Secondary | ICD-10-CM | POA: Diagnosis present

## 2016-12-08 DIAGNOSIS — Z7984 Long term (current) use of oral hypoglycemic drugs: Secondary | ICD-10-CM | POA: Diagnosis not present

## 2016-12-08 LAB — CBC WITH DIFFERENTIAL/PLATELET
BASOS ABS: 0 10*3/uL (ref 0.0–0.1)
Basophils Relative: 0 %
EOS ABS: 0.1 10*3/uL (ref 0.0–0.7)
EOS PCT: 1 %
HCT: 32.2 % — ABNORMAL LOW (ref 36.0–46.0)
Hemoglobin: 11.1 g/dL — ABNORMAL LOW (ref 12.0–15.0)
LYMPHS PCT: 23 %
Lymphs Abs: 2.4 10*3/uL (ref 0.7–4.0)
MCH: 29.8 pg (ref 26.0–34.0)
MCHC: 34.5 g/dL (ref 30.0–36.0)
MCV: 86.6 fL (ref 78.0–100.0)
Monocytes Absolute: 0.7 10*3/uL (ref 0.1–1.0)
Monocytes Relative: 7 %
Neutro Abs: 7.1 10*3/uL (ref 1.7–7.7)
Neutrophils Relative %: 69 %
PLATELETS: 215 10*3/uL (ref 150–400)
RBC: 3.72 MIL/uL — AB (ref 3.87–5.11)
RDW: 12.4 % (ref 11.5–15.5)
WBC: 10.3 10*3/uL (ref 4.0–10.5)

## 2016-12-08 LAB — URINALYSIS, MICROSCOPIC (REFLEX)

## 2016-12-08 LAB — URINALYSIS, ROUTINE W REFLEX MICROSCOPIC
BILIRUBIN URINE: NEGATIVE
GLUCOSE, UA: NEGATIVE mg/dL
Ketones, ur: NEGATIVE mg/dL
NITRITE: NEGATIVE
PH: 5 (ref 5.0–8.0)
Protein, ur: 30 mg/dL — AB
Specific Gravity, Urine: 1.014 (ref 1.005–1.030)

## 2016-12-08 LAB — BASIC METABOLIC PANEL
ANION GAP: 7 (ref 5–15)
BUN: 11 mg/dL (ref 6–20)
CO2: 31 mmol/L (ref 22–32)
Calcium: 9.4 mg/dL (ref 8.9–10.3)
Chloride: 102 mmol/L (ref 101–111)
Creatinine, Ser: 0.9 mg/dL (ref 0.44–1.00)
GFR calc Af Amer: >60 mL/min (ref >60)
Glucose, Bld: 240 mg/dL — ABNORMAL HIGH (ref 65–99)
POTASSIUM: 3.6 mmol/L (ref 3.5–5.1)
SODIUM: 140 mmol/L (ref 135–145)

## 2016-12-08 LAB — I-STAT CG4 LACTIC ACID, ED: LACTIC ACID, VENOUS: 1.53 mmol/L (ref 0.5–1.9)

## 2016-12-08 MED ORDER — MORPHINE SULFATE (PF) 4 MG/ML IV SOLN
4.0000 mg | Freq: Once | INTRAVENOUS | Status: AC
  Administered 2016-12-08: 4 mg via INTRAVENOUS
  Filled 2016-12-08: qty 1

## 2016-12-08 MED ORDER — SODIUM CHLORIDE 0.9 % IV BOLUS (SEPSIS)
1000.0000 mL | Freq: Once | INTRAVENOUS | Status: AC
  Administered 2016-12-08: 1000 mL via INTRAVENOUS

## 2016-12-08 MED ORDER — ONDANSETRON HCL 4 MG/2ML IJ SOLN
4.0000 mg | Freq: Once | INTRAMUSCULAR | Status: AC
  Administered 2016-12-08: 4 mg via INTRAVENOUS
  Filled 2016-12-08: qty 2

## 2016-12-08 MED ORDER — DEXTROSE 5 % IV SOLN
1.0000 g | Freq: Once | INTRAVENOUS | Status: AC
  Administered 2016-12-08: 1 g via INTRAVENOUS
  Filled 2016-12-08: qty 10

## 2016-12-08 NOTE — ED Provider Notes (Signed)
[place] DEPT [facility] Provider Note   CSN: [id number] Arrival date & time: 12/08/16  1820   By signing my name below, I, Charolotte Eke, attest that this documentation has been prepared under the direction and in the presence of Ocie Cornfield, PA-C. Electronically Signed: Charolotte Eke, Scribe. 12/08/16. 9:22 PM.    History   Chief Complaint Chief Complaint  Patient presents with  . Flank Pain    HPI Brandy Lynn is a 51 y.o. female who presents to the Emergency Department complaining of moderate, constant, 8/10 severity right sided flank pain that started yesterday. Pt has associated dysuria, subjective fever yesterday. Pt reports that she has a kidney infectionAnd UTI and this feels similar. She endorses mild nausea no emesis. Patient denies any lightheadedness, dizziness, chest pain, shortness breath, abdominal pain, change in bowel habits.    The history is provided by the patient. No language interpreter was used.    Past Medical History:  Diagnosis Date  . Hemoptysis, unspecified 12/2012, 12/2013  . HTN (hypertension) 11/12/2012  . Hyperlipidemia   . Obesity 12/09/2010  . 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 NSAID and low-protein diet. Uric acid and CBC today.    . Pyelonephritis    hx/notes 06/26/2015  . Sepsis ([place]) hospitalized 06/26/2015  . Sickle cell trait ([place])   . Type II diabetes mellitus [facility])     Patient Active Problem List   Diagnosis Date Noted  . Pyelonephritis 06/26/2015  . Poorly controlled type 2 diabetes mellitus ([place]) 06/26/2015  . Sepsis ([place]) 06/26/2015  . Tachycardia 06/26/2015  . Nausea & vomiting 06/26/2015  . Hyperglycemia 04/17/2014  . Pulmonary hemorrhage 01/07/2014  . Hemoptysis 01/07/2014  . Hemoptysis, unspecified   . Viral bronchitis 10/18/2013  . Hyperlipidemia 08/03/2013  . Eye pain 06/28/2013  . CAP (community acquired pneumonia) 01/13/2013   . HTN (hypertension) 11/12/2012  . Foreign travel 11/12/2012  . 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 Epigastric pain 09/05/2012  . Back pain 07/11/2012  . DM (diabetes mellitus), type 2, uncontrolled ([place]) 12/09/2010  . Obesity 12/09/2010    Past Surgical History:  Procedure Laterality Date  . TUBAL LIGATION  2006    OB History    No data available       Home Medications    Prior to Admission medications   Medication Sig Start Date End Date Taking? Authorizing Provider  aspirin EC 81 MG tablet Take 81 mg by mouth daily.   Yes Historical Provider, MD  Exenatide ER (BYDUREON) 2 MG PEN Inject 2 mg into the skin once a week.   Yes Historical Provider, MD  lisinopril (PRINIVIL,ZESTRIL) 20 MG tablet Take 1 tablet (20 mg total) by mouth daily. 07/01/15  Yes Orson Eva, MD  metFORMIN (GLUCOPHAGE) 1000 MG tablet Take 1,000 mg by mouth 2 (two) times daily with a meal.   Yes Historical Provider, MD  omeprazole (PRILOSEC) 10 MG capsule Take 10 mg by mouth daily.   Yes Historical Provider, MD  simvastatin (ZOCOR) 10 MG tablet Take 10 mg by mouth at bedtime.   Yes Historical Provider, MD  Vitamin D, Ergocalciferol, (DRISDOL) 50000 UNITS CAPS capsule Take 50,000 Units by mouth every 7 (seven) days.   Yes Historical Provider, MD  insulin aspart protamine- aspart (NOVOLOG MIX 70/30) (70-30) 100 UNIT/ML injection Inject 0.2 mLs (20 Units total) into the skin 2 (two) times daily with a meal. 07/01/15   Orson Eva, MD  levofloxacin (  LEVAQUIN) 750 MG tablet Take 1 tablet (750 mg total) by mouth every evening. 07/01/15   Orson Eva, MD  oxyCODONE-acetaminophen (PERCOCET/ROXICET) 5-325 MG tablet Take 1 tablet by mouth every 4 (four) hours as needed for moderate pain or severe pain. 07/01/15   Orson Eva, MD    Family History Family History  Problem Relation Age of Onset  . Diabetes Father   . Diabetes Sister   . Diabetes Brother   . Breast cancer      Social History Social History  Substance Use Topics  .  Smoking status: Never Smoker  . Smokeless tobacco: Never Used  . Alcohol use No     Allergies   Shellfish allergy   Review of Systems Review of Systems  Constitutional: Positive for fever.  Gastrointestinal: Negative for abdominal pain.  Genitourinary: Positive for dysuria, flank pain and frequency.     Physical Exam Updated Vital Signs BP (!) 145/85   Pulse 84   Temp 99 F (37.2 C) (Oral)   Resp 17   Ht 5\' 4"  (1.626 m)   Wt 78 kg   SpO2 100%   BMI 29.52 kg/m   Physical Exam  Constitutional: She is oriented to person, place, and time. She appears well-developed and well-nourished. No distress.  Nontoxic appearing.  HENT:  Head: Normocephalic and atraumatic.  Mouth/Throat: Oropharynx is clear and moist.  Eyes: Conjunctivae are normal. Right eye exhibits no discharge. Left eye exhibits no discharge. No scleral icterus.  Neck: Normal range of motion. Neck supple. No thyromegaly present.  Cardiovascular: Normal rate, regular rhythm, normal heart sounds and intact distal pulses.  Exam reveals no gallop and no friction rub.   No murmur heard. Pulmonary/Chest: Effort normal and breath sounds normal.  Abdominal: Soft. Bowel sounds are normal. She exhibits no distension. There is no tenderness. There is no rebound and no guarding.  Right-sided CVA tenderness.  Musculoskeletal: Normal range of motion.  Lymphadenopathy:    She has no cervical adenopathy.  Neurological: She is alert and oriented to person, place, and time.  Skin: Skin is warm and dry. Capillary refill takes less than 2 seconds.  Psychiatric: She has a normal mood and affect.  Nursing note and vitals reviewed.    ED Treatments / Results   DIAGNOSTIC STUDIES: Oxygen Saturation is 100% on room air, normal by my interpretation.    COORDINATION OF CARE: 9:10 PM Discussed treatment plan with pt at bedside and pt agreed to plan.    Labs (all labs ordered are listed, but only abnormal results are  displayed) Labs Reviewed  URINALYSIS, ROUTINE W REFLEX MICROSCOPIC - Abnormal; Notable for the following:       Result Value   APPearance CLOUDY (*)    Hgb urine dipstick SMALL (*)    Protein, ur 30 (*)    Leukocytes, UA MODERATE (*)    All other components within normal limits  URINALYSIS, MICROSCOPIC (REFLEX) - Abnormal; Notable for the following:    Bacteria, UA FEW (*)    Squamous Epithelial / LPF 0-5 (*)    All other components within normal limits  CBC WITH DIFFERENTIAL/PLATELET - Abnormal; Notable for the following:    RBC 3.72 (*)    Hemoglobin 11.1 (*)    HCT 32.2 (*)    All other components within normal limits  BASIC METABOLIC PANEL - Abnormal; Notable for the following:    Glucose, Bld 240 (*)    All other components within normal limits  URINE CULTURE  I-STAT CG4 LACTIC ACID, ED  I-STAT CG4 LACTIC ACID, ED    EKG  EKG Interpretation None       Radiology Ct Renal Stone Study  Result Date: 12/08/2016 CLINICAL DATA:  Right flank pain and dysuria.  Sickle cell trait. EXAM: CT ABDOMEN AND PELVIS WITHOUT CONTRAST TECHNIQUE: Multidetector CT imaging of the abdomen and pelvis was performed following the standard protocol without IV contrast. COMPARISON:  CT abdomen pelvis 12/04/2015 and 10/10/2011 FINDINGS: Lower chest: No pulmonary nodules or pleural effusion. No visible pericardial effusion. Hepatobiliary: Normal noncontrast appearance of the liver. No visible biliary dilatation. Normal gallbladder. Pancreas: Normal noncontrast appearance of the pancreas. No peripancreatic fluid collection. Spleen: Normal. Adrenal glands: Normal. Urinary Tract: --Right kidney/ureter: There is a 3 mm nonobstructing interpolar right renal stone. The right ureter is unobstructed. There is no hydronephrosis, but the kidney does appear edematous. --Left kidney/ureter: Edematous appearance of the kidney without hydronephrosis or perinephric stranding. No nephrolithiasis. No ureteral obstruction  --Urinary bladder: Nondistended and unremarkable. Stomach/Bowel: Within the left mid abdomen, there is a mass that is isodense to bowel and measures 3.2 x 3.6 x 4.2 cm (AP x Transverse x CC). There is a smaller, peripherally calcified mass adjacent to this mass, likely an enlarged lymph node, measuring 1.6 cm. There are multiple surrounding subcentimeter mesenteric lymph nodes. The dominant lesion has slowly increased in size compared to examinations dating back to 10/10/2011. On the most recent study it measured up to 2.7 cm. No dilated loops of bowel. No evidence of colonic or enteric inflammation. No fluid collection within the abdomen. The appendix is normal. Vascular/Lymphatic: The noncontrast appearance of the major abdominal vessels is normal. As above, there are numerous subcentimeter mesenteric lymph nodes. Reproductive: The uterus and ovaries are normal. Musculoskeletal. Multilevel thoracolumbar osteophytosis. Injection granulomata noted in the subcutaneous fat of both flanks. IMPRESSION: 1. No obstructive uropathy or other acute abnormality of the abdomen or pelvis 2. Unchanged appearance of 3 mm nonobstructing interpolar right renal calculus. 3. Soft tissue mass within the left upper quadrant mesenteries measuring up to 4.2 cm, which is slowly increased in size compared to examinations dating back to 10/10/2011. 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 Electronically Signed   By: Ulyses Jarred M.D.   On: 12/08/2016 22:34    Procedures Procedures (including critical care time)  Medications Ordered in ED Medications  sodium chloride 0.9 % bolus 1,000 mL (0 mLs Intravenous Stopped 12/09/16 0008)  ondansetron (ZOFRAN) injection 4 mg (4 mg Intravenous Given 12/08/16 2200)  morphine 4 MG/ML injection 4 mg (4 mg Intravenous Given 12/08/16 2201)  cefTRIAXone (ROCEPHIN) 1 g in dextrose 5 % 50 mL IVPB (0 g Intravenous  Stopped 12/08/16 2238)     Initial Impression / Assessment and Plan / ED Course  I have reviewed the triage vital signs and the nursing notes.  Pertinent labs & imaging results that were available during my care of the patient were reviewed by me and considered in my medical decision making (see chart for details).    Patient presents to the ED with complaints of right flank pain, urinary symptoms, subjective fevers, nausea. History of pyelonephritis and UTI and states this feels similar. No leukocytosis is noted. Patient is afebrile. One documented tachycardia of 107 however on my exam patient was not tachycardic. She does not meet SIRS criteria. Lactic is normal. Electrolytes are normal. Urine with moderate WBCs, RBCs, few bacteria. We'll culture urine.  This is likely pyelonephritis given right flank pain, CVA tenderness, subjective fevers, UA. CT scan shows no obstructing stone. She does have edema of the right kidney. 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 Patient has no pain to this area. Patient is nontender. Patient has follow with primary care doctor tomorrow. Encouraged to discuss these findings with them. Patient is nontoxic appearing. She appears to be in no acute distress. Pain was treated in the ED. She is able tolerate by mouth fluids without difficulties. She was given IV Rocephin and will be discharged home on Keflex. Pt is hemodynamically stable, in NAD, & able to ambulate in the ED. Pain has been managed & has no complaints prior to dc. Pt is comfortable with above plan and is stable for discharge at this time. All questions were answered prior to disposition. Strict return precautions for f/u to the ED were discussed.  Final Clinical Impressions(s) / ED Diagnoses   Final diagnoses:  Right flank pain  Acute cystitis without hematuria    New  Prescriptions Discharge Medication List as of 12/09/2016 12:15 AM    START taking these medications   Details  cephALEXin (KEFLEX) 500 MG capsule Take 1 capsule (500 mg total) by mouth 3 (three) times daily., Starting Tue 12/09/2016, Print    HYDROcodone-acetaminophen (NORCO) 5-325 MG tablet Take 1 tablet by mouth every 6 (six) hours as needed., Starting Tue 12/09/2016, Print    ondansetron (ZOFRAN) 4 MG tablet Take 1 tablet (4 mg total) by mouth every 6 (six) hours., Starting Tue 12/09/2016, Print       I personally performed the services described in this documentation, which was scribed in my presence. The recorded information has been reviewed and is accurate.     Doristine Devoid, PA-C 12/09/16 2202    Gwenyth Allegra Tegeler, MD 12/10/16 1012

## 2016-12-08 NOTE — ED Triage Notes (Signed)
Right flank pain. Dysuria. Cloudy urine.

## 2016-12-09 MED ORDER — CEPHALEXIN 500 MG PO CAPS: 500.0000 mg | ORAL_CAPSULE | Freq: Three times a day (TID) | ORAL | 0 refills | Status: DC

## 2016-12-09 MED ORDER — ONDANSETRON HCL 4 MG PO TABS: 4.0000 mg | ORAL_TABLET | Freq: Four times a day (QID) | ORAL | 0 refills | Status: DC

## 2016-12-09 MED ORDER — HYDROCODONE-ACETAMINOPHEN 5-325 MG PO TABS: 1.0000 | ORAL_TABLET | Freq: Four times a day (QID) | ORAL | 0 refills | Status: DC | PRN

## 2016-12-09 NOTE — Discharge Instructions (Signed)
You have a kidney infection. Please of antibiotics as prescribed. Use the pain medicine as needed. May supplement with Motrin. Take the Zofran as needed for nausea. Make sure you drinking plenty of fluids. Have discussed the CT findings with U with the mass in her left upper abdomen. Please see her primary care doctor appointment tomorrow discussed this. Return to ED developing worsening symptoms or unable to control her vomiting at home.

## 2016-12-10 LAB — URINE CULTURE

## 2017-03-03 ENCOUNTER — Emergency Department (HOSPITAL_BASED_OUTPATIENT_CLINIC_OR_DEPARTMENT_OTHER): Payer: BLUE CROSS/BLUE SHIELD

## 2017-03-03 ENCOUNTER — Encounter (HOSPITAL_BASED_OUTPATIENT_CLINIC_OR_DEPARTMENT_OTHER): Payer: Self-pay | Admitting: *Deleted

## 2017-03-03 ENCOUNTER — Emergency Department (HOSPITAL_BASED_OUTPATIENT_CLINIC_OR_DEPARTMENT_OTHER)
Admission: EM | Admit: 2017-03-03 | Discharge: 2017-03-04 | Disposition: A | Discharge status: Home / Self Care | Payer: BLUE CROSS/BLUE SHIELD | Attending: Emergency Medicine | Admitting: Emergency Medicine

## 2017-03-03 DIAGNOSIS — Z79899 Other long term (current) drug therapy: Secondary | ICD-10-CM | POA: Diagnosis not present

## 2017-03-03 DIAGNOSIS — D649 Anemia, unspecified: Secondary | ICD-10-CM

## 2017-03-03 DIAGNOSIS — E119 Type 2 diabetes mellitus without complications: Secondary | ICD-10-CM | POA: Insufficient documentation

## 2017-03-03 DIAGNOSIS — Z7984 Long term (current) use of oral hypoglycemic drugs: Secondary | ICD-10-CM | POA: Insufficient documentation

## 2017-03-03 DIAGNOSIS — R1031 Right lower quadrant pain: Secondary | ICD-10-CM | POA: Diagnosis not present

## 2017-03-03 DIAGNOSIS — I10 Essential (primary) hypertension: Secondary | ICD-10-CM | POA: Diagnosis not present

## 2017-03-03 DIAGNOSIS — Z7982 Long term (current) use of aspirin: Secondary | ICD-10-CM | POA: Diagnosis not present

## 2017-03-03 DIAGNOSIS — R109 Unspecified abdominal pain: Secondary | ICD-10-CM

## 2017-03-03 LAB — COMPREHENSIVE METABOLIC PANEL
ALBUMIN: 3.6 g/dL (ref 3.5–5.0)
ALT: 13 U/L — ABNORMAL LOW (ref 14–54)
ANION GAP: 8 (ref 5–15)
AST: 16 U/L (ref 15–41)
Alkaline Phosphatase: 48 U/L (ref 38–126)
BILIRUBIN TOTAL: 0.3 mg/dL (ref 0.3–1.2)
BUN: 6 mg/dL (ref 6–20)
CHLORIDE: 103 mmol/L (ref 101–111)
CO2: 29 mmol/L (ref 22–32)
Calcium: 8.8 mg/dL — ABNORMAL LOW (ref 8.9–10.3)
Creatinine, Ser: 0.76 mg/dL (ref 0.44–1.00)
GFR calc Af Amer: >60 mL/min (ref >60)
GFR calc non Af Amer: >60 mL/min (ref >60)
GLUCOSE: 118 mg/dL — AB (ref 65–99)
POTASSIUM: 3.5 mmol/L (ref 3.5–5.1)
SODIUM: 140 mmol/L (ref 135–145)
TOTAL PROTEIN: 6.3 g/dL — AB (ref 6.5–8.1)

## 2017-03-03 LAB — CBC WITH DIFFERENTIAL/PLATELET
Basophils Absolute: 0 10*3/uL (ref 0.0–0.1)
Basophils Relative: 0 %
EOS PCT: 2 %
Eosinophils Absolute: 0.1 10*3/uL (ref 0.0–0.7)
HEMATOCRIT: 30.5 % — AB (ref 36.0–46.0)
Hemoglobin: 10.7 g/dL — ABNORMAL LOW (ref 12.0–15.0)
LYMPHS ABS: 2 10*3/uL (ref 0.7–4.0)
LYMPHS PCT: 35 %
MCH: 30.1 pg (ref 26.0–34.0)
MCHC: 35.1 g/dL (ref 30.0–36.0)
MCV: 85.7 fL (ref 78.0–100.0)
MONO ABS: 0.7 10*3/uL (ref 0.1–1.0)
MONOS PCT: 12 %
NEUTROS ABS: 2.9 10*3/uL (ref 1.7–7.7)
Neutrophils Relative %: 51 %
PLATELETS: 214 10*3/uL (ref 150–400)
RBC: 3.56 MIL/uL — ABNORMAL LOW (ref 3.87–5.11)
RDW: 12.6 % (ref 11.5–15.5)
WBC: 5.6 10*3/uL (ref 4.0–10.5)

## 2017-03-03 LAB — URINALYSIS, MICROSCOPIC (REFLEX): RBC / HPF: NONE SEEN RBC/hpf (ref 0–5)

## 2017-03-03 LAB — URINALYSIS, ROUTINE W REFLEX MICROSCOPIC
Bilirubin Urine: NEGATIVE
Glucose, UA: NEGATIVE mg/dL
HGB URINE DIPSTICK: NEGATIVE
KETONES UR: NEGATIVE mg/dL
Nitrite: NEGATIVE
PH: 5 (ref 5.0–8.0)
PROTEIN: NEGATIVE mg/dL
Specific Gravity, Urine: 1.015 (ref 1.005–1.030)

## 2017-03-03 LAB — LIPASE, BLOOD: Lipase: 36 U/L (ref 11–51)

## 2017-03-03 MED ORDER — SODIUM CHLORIDE 0.9 % IV BOLUS (SEPSIS)
1000.0000 mL | Freq: Once | INTRAVENOUS | Status: AC
  Administered 2017-03-03: 1000 mL via INTRAVENOUS

## 2017-03-03 MED ORDER — IOPAMIDOL (ISOVUE-300) INJECTION 61%
100.0000 mL | Freq: Once | INTRAVENOUS | Status: AC | PRN
  Administered 2017-03-03: 100 mL via INTRAVENOUS

## 2017-03-03 MED ORDER — KETOROLAC TROMETHAMINE 30 MG/ML IJ SOLN
15.0000 mg | Freq: Once | INTRAMUSCULAR | Status: AC
  Administered 2017-03-03: 15 mg via INTRAVENOUS
  Filled 2017-03-03: qty 1

## 2017-03-03 MED ORDER — FENTANYL CITRATE (PF) 100 MCG/2ML IJ SOLN
50.0000 ug | Freq: Once | INTRAMUSCULAR | Status: AC
  Administered 2017-03-03: 50 ug via INTRAVENOUS
  Filled 2017-03-03: qty 2

## 2017-03-03 NOTE — ED Triage Notes (Signed)
Right flank pain radiating into her right mid abdominal quadrant since yesterday. States the last time she had this pain it was a UTI.

## 2017-03-03 NOTE — ED Notes (Signed)
ED Provider at bedside. 

## 2017-03-03 NOTE — ED Provider Notes (Signed)
[facility] DEPT MHP Provider Note   CSN: [id number] Arrival date & time: 03/03/17  1759  By signing my name below, I, Theresia Bough, attest that this documentation has been prepared under the direction and in the presence of Sherwood Gambler, MD. Electronically Signed: Theresia Bough, ED Scribe. 03/03/17. 7:26 PM.  History   Chief Complaint Chief Complaint  Patient presents with  . Flank Pain   The history is provided by the patient. No language interpreter was used.   HPI Comments: Brandy Lynn is a 51 y.o. female with a PMHx of UTI, pyelonephritis, and DM, who presents to the Emergency Department complaining of 9/10 constant, worsening right flank pain onset yesterday. Pt describes pain as stabbing. Pt has a hx of similar and was diagnosed with a kidney infection.Pt reports associated nausea and decreased urination. Pt has tried ibuprofen/Tylenol for the pain with minimal relief. Pt denies fever, vomiting, dysuria, hematuria or any other complaints at this time. NKDA  Past Medical History:  Diagnosis Date  . Hemoptysis, unspecified 12/2012, 12/2013  . HTN (hypertension) 11/12/2012  . Hyperlipidemia   . Obesity 12/09/2010  . 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 NSAID and low-protein diet. Uric acid and CBC today.    . Pyelonephritis    hx/notes 06/26/2015  . Sepsis ([place]) hospitalized 06/26/2015  . Sickle cell trait ([place])   . Type II diabetes mellitus [facility])     Patient Active Problem List   Diagnosis Date Noted  . Pyelonephritis 06/26/2015  . Poorly controlled type 2 diabetes mellitus ([place]) 06/26/2015  . Sepsis ([place]) 06/26/2015  . Tachycardia 06/26/2015  . Nausea & vomiting 06/26/2015  . Hyperglycemia 04/17/2014  . Pulmonary hemorrhage 01/07/2014  . Hemoptysis 01/07/2014  . Hemoptysis, unspecified   . Viral bronchitis 10/18/2013  . Hyperlipidemia 08/03/2013  . Eye pain 06/28/2013    . CAP (community acquired pneumonia) 01/13/2013  . HTN (hypertension) 11/12/2012  . Foreign travel 11/12/2012  . 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 Epigastric pain 09/05/2012  . Back pain 07/11/2012  . DM (diabetes mellitus), type 2, uncontrolled ([place]) 12/09/2010  . Obesity 12/09/2010    Past Surgical History:  Procedure Laterality Date  . TUBAL LIGATION  2006    OB History    No data available       Home Medications    Prior to Admission medications   Medication Sig Start Date End Date Taking? Authorizing Provider  aspirin EC 81 MG tablet Take 81 mg by mouth daily.    [provider]  cephALEXin (KEFLEX) 500 MG capsule Take 1 capsule (500 mg total) by mouth 3 (three) times daily. 12/09/16   Doristine Devoid, PA-C  Exenatide ER (BYDUREON) 2 MG PEN Inject 2 mg into the skin once a week.    [provider]  HYDROcodone-acetaminophen (NORCO) 5-325 MG tablet Take 1 tablet by mouth every 6 (six) hours as needed. 12/09/16   Doristine Devoid, PA-C  insulin aspart protamine- aspart (NOVOLOG MIX 70/30) (70-30) 100 UNIT/ML injection Inject 0.2 mLs (20 Units total) into the skin 2 (two) times daily with a meal. 07/01/15   Tat, Shanon Brow, MD  levofloxacin (LEVAQUIN) 750 MG tablet Take 1 tablet (750 mg total) by mouth every evening. 07/01/15   Orson Eva, MD  lisinopril (PRINIVIL,ZESTRIL) 20 MG tablet Take 1 tablet (20 mg total) by mouth daily. 07/01/15   Orson Eva, MD  metFORMIN (GLUCOPHAGE) 1000 MG tablet Take 1,000  mg by mouth 2 (two) times daily with a meal.    [provider]  omeprazole (PRILOSEC) 10 MG capsule Take 10 mg by mouth daily.    [provider]  ondansetron (ZOFRAN) 4 MG tablet Take 1 tablet (4 mg total) by mouth every 6 (six) hours. 12/09/16   Doristine Devoid, PA-C  oxyCODONE-acetaminophen (PERCOCET/ROXICET) 5-325 MG tablet Take 1 tablet by mouth every 4 (four) hours as needed for moderate pain or severe pain. 07/01/15   Orson Eva, MD   simvastatin (ZOCOR) 10 MG tablet Take 10 mg by mouth at bedtime.    [provider]  Vitamin D, Ergocalciferol, (DRISDOL) 50000 UNITS CAPS capsule Take 50,000 Units by mouth every 7 (seven) days.    [provider]    Family History Family History  Problem Relation Age of Onset  . Diabetes Father   . Diabetes Sister   . Diabetes Brother   . Breast cancer Unknown     Social History Social History  Substance Use Topics  . Smoking status: Never Smoker  . Smokeless tobacco: Never Used  . Alcohol use No     Allergies   Shellfish allergy   Review of Systems Review of Systems  Constitutional: Negative for fever.  Gastrointestinal: Positive for nausea. Negative for vomiting.  Genitourinary: Positive for flank pain. Negative for dysuria, frequency and hematuria.  All other systems reviewed and are negative.    Physical Exam Updated Vital Signs BP (!) 148/75 (BP Location: Right Arm)   Pulse 75   Temp 98.3 F (36.8 C) (Oral)   Resp 20   Ht 5\' 4"  (1.626 m)   Wt 77.1 kg (170 lb)   SpO2 99%   BMI 29.18 kg/m   Physical Exam  Constitutional: She is oriented to person, place, and time. She appears well-developed and well-nourished.  HENT:  Head: Normocephalic and atraumatic.  Right Ear: External ear normal.  Left Ear: External ear normal.  Nose: Nose normal.  Eyes: Right eye exhibits no discharge. Left eye exhibits no discharge.  Cardiovascular: Normal rate, regular rhythm and normal heart sounds.   Pulmonary/Chest: Effort normal and breath sounds normal.  Abdominal: Soft. There is tenderness.  RUQ and RLQ tenderness. Worse in the RLQ. Right sided CVA tenderness.   Neurological: She is alert and oriented to person, place, and time.  Skin: Skin is warm and dry.  Nursing note and vitals reviewed.    ED Treatments / Results  DIAGNOSTIC STUDIES: Oxygen Saturation is 100% on RA, normal by my interpretation.   COORDINATION OF CARE: 7:23  PM-Discussed next steps with pt including blood work and [place]. Pt verbalized understanding and is agreeable with the plan.   Labs (all labs ordered are listed, but only abnormal results are displayed) Labs Reviewed  URINALYSIS, ROUTINE W REFLEX MICROSCOPIC - Abnormal; Notable for the following:       Result Value   APPearance CLOUDY (*)    Leukocytes, UA SMALL (*)    All other components within normal limits  URINALYSIS, MICROSCOPIC (REFLEX) - Abnormal; Notable for the following:    Bacteria, UA FEW (*)    Squamous Epithelial / LPF 6-30 (*)    All other components within normal limits  COMPREHENSIVE METABOLIC PANEL - Abnormal; Notable for the following:    Glucose, Bld 118 (*)    Calcium 8.8 (*)    Total Protein 6.3 (*)    ALT 13 (*)    All other components within normal limits  CBC WITH DIFFERENTIAL/PLATELET - Abnormal; Notable for the following:    RBC 3.56 (*)    Hemoglobin 10.7 (*)    HCT 30.5 (*)    All other components within normal limits  URINE CULTURE  LIPASE, BLOOD    EKG  EKG Interpretation None       Radiology Dg Abdomen 1 View  Result Date: 03/03/2017 CLINICAL DATA:  Right-sided flank pain for several days EXAM: ABDOMEN - 1 VIEW COMPARISON:  03/03/2017, 12/08/2016 FINDINGS: Scattered large and small bowel gas is noted. Fecal material is noted throughout colon consistent with a degree of constipation. Multiple injection granulomas are noted within the buttocks bilaterally. Mild degenerative changes are seen. No acute abnormality is noted. IMPRESSION: No acute abnormality seen. Electronically Signed   By: Inez Catalina M.D.   On: 03/03/2017 21:05   US Abdomen Complete  Result Date: 03/03/2017 CLINICAL DATA:  Initial evaluation for acute right flank pain for 1 day, nausea. EXAM: ABDOMEN ULTRASOUND COMPLETE COMPARISON:  Prior CT from 12/08/2016. FINDINGS: Gallbladder: No gallstones or wall thickening visualized. No sonographic Murphy sign noted by sonographer. Common  bile duct: Diameter: 4.3 mm Liver: No focal lesion identified. Within normal limits in parenchymal echogenicity. IVC: No abnormality visualized. Pancreas: Visualized portion unremarkable. Spleen: Size and appearance within normal limits. Right Kidney: Length: 11.4 cm. Echogenicity within normal limits. No mass or hydronephrosis visualized. No echogenic nephrolithiasis. Left Kidney: Length: 11.3 cm. Echogenicity within normal limits. No mass or hydronephrosis visualized. No echogenic nephrolithiasis. Abdominal aorta: No aneurysm visualized. Other findings: Bilateral ureteral jets were visualized. IMPRESSION: Normal abdominal ultrasound.  No acute abnormality identified. Electronically Signed   By: Jeannine Boga M.D.   On: 03/03/2017 20:49    Procedures Procedures (including critical care time)  Medications Ordered in ED Medications  sodium chloride 0.9 % bolus 1,000 mL (0 mLs Intravenous Stopped 03/04/17 0009)  ketorolac (TORADOL) 30 MG/ML injection 15 mg (15 mg Intravenous Given 03/03/17 1952)  fentaNYL (SUBLIMAZE) injection 50 mcg (50 mcg Intravenous Given 03/03/17 1952)  iopamidol (ISOVUE-300) 61 % injection 100 mL (100 mLs Intravenous Contrast Given 03/03/17 2347)     Initial Impression / Assessment and Plan / ED Course  I have reviewed the triage vital signs and the nursing notes.  Pertinent labs & imaging results that were available during my care of the patient were reviewed by me and considered in my medical decision making (see chart for details).     Given continued RLQ pain, will get CT to r/o appendicitis. No UTI symptoms, urine is equivocal. Care to Dr. Roxanne Mins with CT pending.   Final Clinical Impressions(s) / ED Diagnoses   Final diagnoses:  Right flank pain    New Prescriptions New Prescriptions   No medications on file   I personally performed the services described in this documentation, which was scribed in my presence. The recorded information has been reviewed  and is accurate.     Sherwood Gambler, MD 03/04/17 [phone]

## 2017-03-04 MED ORDER — TRAMADOL HCL 50 MG PO TABS: 50.0000 mg | ORAL_TABLET | Freq: Four times a day (QID) | ORAL | 0 refills | Status: DC | PRN

## 2017-03-04 NOTE — Discharge Instructions (Signed)
Take acetaminophen or ibuprofen as needed for less severe pain. Return if symptoms are getting worse.

## 2017-03-04 NOTE — ED Provider Notes (Signed)
Patient care signed out to me pending results of CT of abdomen and pelvis. 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 Laboratory workup is remarkable only for mild anemia which is stable. Patient states that she is feeling much better. She is discharged with prescription for tramadol, advised to use over-the-counter analgesics as needed. Return precautions discussed.  Results for orders placed or performed during the hospital encounter of 03/03/17  Urinalysis, Routine w reflex microscopic  Result Value Ref Range   Color, Urine YELLOW YELLOW   APPearance CLOUDY (A) CLEAR   Specific Gravity, Urine 1.015 1.005 - 1.030   pH 5.0 5.0 - 8.0   Glucose, UA NEGATIVE NEGATIVE mg/dL   Hgb urine dipstick NEGATIVE NEGATIVE   Bilirubin Urine NEGATIVE NEGATIVE   Ketones, ur NEGATIVE NEGATIVE mg/dL   Protein, ur NEGATIVE NEGATIVE mg/dL   Nitrite NEGATIVE NEGATIVE   Leukocytes, UA SMALL (A) NEGATIVE  Urinalysis, Microscopic (reflex)  Result Value Ref Range   RBC / HPF NONE SEEN 0 - 5 RBC/hpf   WBC, UA 6-30 0 - 5 WBC/hpf   Bacteria, UA FEW (A) NONE SEEN   Squamous Epithelial / LPF 6-30 (A) NONE SEEN  Comprehensive metabolic panel  Result Value Ref Range   Sodium 140 135 - 145 mmol/L   Potassium 3.5 3.5 - 5.1 mmol/L   Chloride 103 101 - 111 mmol/L   CO2 29 22 - 32 mmol/L   Glucose, Bld 118 (H) 65 - 99 mg/dL   BUN 6 6 - 20 mg/dL   Creatinine, Ser 0.76 0.44 - 1.00 mg/dL   Calcium 8.8 (L) 8.9 - 10.3 mg/dL   Total Protein 6.3 (L) 6.5 - 8.1 g/dL   Albumin 3.6 3.5 - 5.0 g/dL   AST 16 15 - 41 U/L   ALT 13 (L) 14 - 54 U/L   Alkaline Phosphatase 48 38 - 126 U/L   Total Bilirubin 0.3 0.3 - 1.2 mg/dL   GFR calc non Af Amer >60 >60 mL/min   GFR calc Af Amer >60 >60 mL/min   Anion gap 8 5 - 15  CBC with Differential  Result Value Ref Range   WBC 5.6 4.0 - 10.5 K/uL   RBC 3.56 (L) 3.87 - 5.11 MIL/uL   Hemoglobin 10.7 (L) 12.0 - 15.0 g/dL   HCT 30.5 (L) 36.0 - 46.0  %   MCV 85.7 78.0 - 100.0 fL   MCH 30.1 26.0 - 34.0 pg   MCHC 35.1 30.0 - 36.0 g/dL   RDW 12.6 11.5 - 15.5 %   Platelets 214 150 - 400 K/uL   Neutrophils Relative % 51 %   Neutro Abs 2.9 1.7 - 7.7 K/uL   Lymphocytes Relative 35 %   Lymphs Abs 2.0 0.7 - 4.0 K/uL   Monocytes Relative 12 %   Monocytes Absolute 0.7 0.1 - 1.0 K/uL   Eosinophils Relative 2 %   Eosinophils Absolute 0.1 0.0 - 0.7 K/uL   Basophils Relative 0 %   Basophils Absolute 0.0 0.0 - 0.1 K/uL  Lipase, blood  Result Value Ref Range   Lipase 36 11 - 51 U/L   Dg Abdomen 1 View  Result Date: 03/03/2017 CLINICAL DATA:  Right-sided flank pain for several days EXAM: ABDOMEN - 1 VIEW COMPARISON:  03/03/2017, 12/08/2016 FINDINGS: Scattered large and small bowel gas is noted. Fecal material is noted throughout colon consistent with a degree of constipation. Multiple injection granulomas are noted within the buttocks bilaterally.  Mild degenerative changes are seen. No acute abnormality is noted. IMPRESSION: No acute abnormality seen. Electronically Signed   By: Inez Catalina M.D.   On: 03/03/2017 21:05   US Abdomen Complete  Result Date: 03/03/2017 CLINICAL DATA:  Initial evaluation for acute right flank pain for 1 day, nausea. EXAM: ABDOMEN ULTRASOUND COMPLETE COMPARISON:  Prior CT from 12/08/2016. FINDINGS: Gallbladder: No gallstones or wall thickening visualized. No sonographic Murphy sign noted by sonographer. Common bile duct: Diameter: 4.3 mm Liver: No focal lesion identified. Within normal limits in parenchymal echogenicity. IVC: No abnormality visualized. Pancreas: Visualized portion unremarkable. Spleen: Size and appearance within normal limits. Right Kidney: Length: 11.4 cm. Echogenicity within normal limits. No mass or hydronephrosis visualized. No echogenic nephrolithiasis. Left Kidney: Length: 11.3 cm. Echogenicity within normal limits. No mass or hydronephrosis visualized. No echogenic nephrolithiasis. Abdominal aorta: No  aneurysm visualized. Other findings: Bilateral ureteral jets were visualized. IMPRESSION: Normal abdominal ultrasound.  No acute abnormality identified. Electronically Signed   By: Jeannine Boga M.D.   On: 03/03/2017 20:49   Ct Abdomen Pelvis W Contrast  Result Date: 03/04/2017 CLINICAL DATA:  Right lower quadrant pain for 2 days.  Nausea. EXAM: CT ABDOMEN AND PELVIS WITH CONTRAST TECHNIQUE: Multidetector CT imaging of the abdomen and pelvis was performed using the standard protocol following bolus administration of intravenous contrast. CONTRAST:  186mL ISOVUE-300 IOPAMIDOL (ISOVUE-300) INJECTION 61% COMPARISON:  CT abdomen pelvis 12/08/2016 FINDINGS: Lower chest: No pulmonary nodules. No visible pleural or pericardial effusion. Hepatobiliary: Normal hepatic size and contours without focal liver lesion. No perihepatic ascites. No intra- or extrahepatic biliary dilatation. Normal gallbladder. Pancreas: Normal pancreatic contours and enhancement. No peripancreatic fluid collection or pancreatic ductal dilatation. Spleen: Normal. Adrenals/Urinary Tract: Normal adrenal glands. No hydronephrosis or solid renal mass. Stomach/Bowel: There is no hiatal hernia. The stomach and duodenum are normal. There is no dilated small bowel or enteric inflammation. There is no colonic abnormality. The appendix is normal. Mass centered within the mid abdominal mesenteric measures 4.4 x 3.5 cm, previously 3.6 x 3.2 cm. Vascular/Lymphatic: Normal course and caliber of the major abdominal vessels. No abdominal or pelvic adenopathy. Reproductive: Normal uterus. Right ovarian cyst, likely physiologic, unchanged. Musculoskeletal: Multilevel thoracic osteophytosis. No bony spinal canal stenosis. There are multiple soft tissue calcifications within the posterior lower abdominal subcutaneous fat. Other: No contributory non-categorized findings. IMPRESSION: 1. Normal appendix.  No acute abnormality of the abdomen or pelvis. 2. Slightly  increased size of mesenteric soft tissue mass. 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 Electronically Signed   By: Ulyses Jarred M.D.   On: 03/04/2017 00:53    Images viewed by me.    Delora Fuel, MD 53/61/44 [phone]

## 2017-03-05 LAB — URINE CULTURE

## 2017-03-12 DIAGNOSIS — K6389 Other specified diseases of intestine: Secondary | ICD-10-CM | POA: Insufficient documentation

## 2017-08-07 DIAGNOSIS — H16223 Keratoconjunctivitis sicca, not specified as Sjogren's, bilateral: Secondary | ICD-10-CM | POA: Insufficient documentation

## 2017-08-07 DIAGNOSIS — H35363 Drusen (degenerative) of macula, bilateral: Secondary | ICD-10-CM | POA: Insufficient documentation

## 2017-08-07 DIAGNOSIS — H2513 Age-related nuclear cataract, bilateral: Secondary | ICD-10-CM | POA: Insufficient documentation

## 2017-08-07 DIAGNOSIS — H43393 Other vitreous opacities, bilateral: Secondary | ICD-10-CM | POA: Insufficient documentation

## 2017-08-07 DIAGNOSIS — H31001 Unspecified chorioretinal scars, right eye: Secondary | ICD-10-CM | POA: Insufficient documentation

## 2017-08-07 DIAGNOSIS — H5213 Myopia, bilateral: Secondary | ICD-10-CM | POA: Insufficient documentation

## 2017-09-10 ENCOUNTER — Ambulatory Visit (INDEPENDENT_AMBULATORY_CARE_PROVIDER_SITE_OTHER): Payer: BLUE CROSS/BLUE SHIELD | Admitting: Student

## 2017-09-10 ENCOUNTER — Encounter: Payer: Self-pay | Admitting: Student

## 2017-09-10 VITALS — BP 140/80 | HR 89 | Temp 98.0°F | Ht 65.0 in | Wt 182.8 lb

## 2017-09-10 DIAGNOSIS — Z114 Encounter for screening for human immunodeficiency virus [HIV]: Secondary | ICD-10-CM

## 2017-09-10 DIAGNOSIS — E118 Type 2 diabetes mellitus with unspecified complications: Secondary | ICD-10-CM

## 2017-09-10 DIAGNOSIS — Z8639 Personal history of other endocrine, nutritional and metabolic disease: Secondary | ICD-10-CM | POA: Diagnosis not present

## 2017-09-10 DIAGNOSIS — Z1329 Encounter for screening for other suspected endocrine disorder: Secondary | ICD-10-CM | POA: Diagnosis not present

## 2017-09-10 DIAGNOSIS — I10 Essential (primary) hypertension: Secondary | ICD-10-CM

## 2017-09-10 DIAGNOSIS — Z862 Personal history of diseases of the blood and blood-forming organs and certain disorders involving the immune mechanism: Secondary | ICD-10-CM

## 2017-09-10 DIAGNOSIS — E785 Hyperlipidemia, unspecified: Secondary | ICD-10-CM

## 2017-09-10 DIAGNOSIS — Z0000 Encounter for general adult medical examination without abnormal findings: Secondary | ICD-10-CM

## 2017-09-10 LAB — POCT GLYCOSYLATED HEMOGLOBIN (HGB A1C): Hemoglobin A1C: 8.1

## 2017-09-10 NOTE — Progress Notes (Signed)
Subjective:   Chief Complaint  Patient presents with  . Establish Care   HPI Brandy Lynn is a 52 y.o. old female here .  She reports history of diabetes and hypertension Concern today: none Changes in his/her health in the last 12 months: no Occupation: hair dresser Wears seatbelt: yes.    The patient has regular exercise: no.  Enough vegetables and fruits: no.  Smokes cigarette: no Drinks EtOH: no Drug use: no Patient takes ASA: yes.  Patient takes vitD & Ca: yes. Vitamin D Ever been transfused or tattooed?: no.  The patient is sexually active. Yes. With her husband.  Patient uses birth control: BTL  Domestic violence: no.  Advance directive: no. History of depression:no.  Patient dental home: no.  Immunizations  Needs influenza vaccine: she thinks she got this at her previous PCP office.  Needs HPV (Women until age 20): not applicable.  Needs Shingrix (all >19yr of age): yes.  Needs Tdap: no.  Needs Pneumococcal: had in 2015.  Screening Need colon cancer screening: [facility] Need breast cancer c[facility]ening: [facility] [place] Need cervical cancer Screening: [facility] [place] STOP BANG >/=3 for OSA: no. Need lung cancer screening (men > 55): no. Need AAA screening (men 65-74, >100 cigarettes): not applicable At risk for skin cancer: no. Need HCV Screening: no. Need STI Screening: no. Fall in the last 12 months:no  PMH/Problem List: has DM (diabetes mellitus), type 2, uncontrolled ([place]; Obesity; Podagra; HTN (hypertension); Hyperlipidemia; and Hemoptysis, unspecified on their problem list.   has a past medical history of Hemoptysis, unspecified (12/2012, 12/2013), HTN (hypertension) (11/12/2012), Hyperlipidemia, Obesity (12/09/2010), Podagra (09/24/2012), [place]elonephritis, [place]), Sickle cell trait ([place], and [place] II diabetes mellitus ([place].  [facility]  Problem Relation Age of Onset  . Diabetes Father   . Diabetes Sister   . Diabetes Brother   . Breast cancer Sister        breast cancer in her 577'sand passed away about 571 Not [facility] . Leukemia Brother    Family history of heart disease before age of 634yrs: no. Family history of stroke: no. Family history of cancer: yes. Leukemia and breast cancer in brother and sister  [place]   Tobacco Use  . Smoking status: Never Smoker  . Smokeless tobacco: Never Used  Substance Use Topics  . Alcohol use: No  . Drug use: No   Review of Systems  Constitutional: Negative for appetite change, fatigue, fever and unexpected weight change.  HENT: Negative for trouble swallowing.   Eyes: Negative for pain and visual disturbance.  Respiratory: Negative for cough, chest tightness and shortness of breath.   Cardiovascular: Negative for chest pain, palpitations and leg swelling.  Gastrointestinal: Negative for blood in stool, diarrhea, nausea and vomiting.  Endocrine: Negative for cold intolerance, heat intolerance, polydipsia, polyphagia and polyuria.  Genitourinary: Negative for dysuria, hematuria, vaginal bleeding and vaginal discharge.  Musculoskeletal: Negative for arthralgias, back pain and myalgias.  Skin: Negative for rash.  Allergic/Immunologic: Positive for food allergies.  Neurological: Negative for syncope, light-headedness, numbness and headaches.  Hematological: Negative for adenopathy. Does not bruise/bleed easily.  Psychiatric/Behavioral: Negative for dysphoric mood. The patient is not nervous/anxious.        Objective:   Physical Exam Vitals:  09/10/17 0931  BP: 140/80  Pulse: 89  Temp: 98 F (36.7 C)  TempSrc: Oral  SpO2: 98%  Weight: 182 lb 12.8 oz (82.9 kg)  Height: _0  (1.651 m)   Body mass index is 30.42 kg/m.  GEN: appears  well, no apparent distress. Head: normocephalic and atraumatic  Eyes: conjunctiva without injection, sclera anicteric Ears: external ear and ear canal normal Oropharynx: mmm without erythema or exudation.  No notable dental cavities but some discoloration. HEM: negative for cervical or periauricular lymphadenopathies CVS: RRR, nl s1 & s2, no murmurs, no edema RESP: no IWOB, good air movement bilaterally, CTAB GI: BS present & normal, soft, NTND GU: no suprapubic or CVA tenderness MSK: no focal tenderness or notable swelling SKIN: no apparent skin lesion ENDO: negative thyromegally NEURO: alert and oiented appropriately, no gross deficits  PSYCH: euthymic mood with congruent affect    Assessment & Plan:  1. Annual physical exam: Updated history, medication list and allergies.  She says she is up-to-date on colonoscopy, Pap smear and mammogram. Obtained medical release form.  Recommended regular dental checkup.  Will update her immunizations when we recieve her records.  2. Type 2 diabetes mellitus with complication, unspecified whether long term insulin use ([place]): A1c 8.1% today.  She says her previous A1c was in 8 as well.  We will continue Bydureon, NovoLog and Metformin.  She is on lisinopril for renal protection.  She may need at least moderate intensity statin for hyperlipidemia.  Discussed lifestyle change including diet and exercise. Gave her handouts. She reports having an eye exam about a month ago.  Recommended regular dental checkup.   Follow-up in 1 months.  3. Hypertension, unspecified type: Blood pressure at goal.  Continue lisinopril 20 mg daily.  Discussed lifestyle change including diet and exercise as above. - CMP14+EGFR - TSH  4. Hyperlipidemia, unspecified hyperlipidemia type: She is on simvastatin 10 mg daily.  Will check lipid panel today.  She would benefit from moderate to severe intensity statin.  Will update this at next visit in 1 months. - Lipid panel  5.  Screening for hypothyroidism - TSH  6. History of vitamin D deficiency: She is on vitamin D 50,000 international unit weekly. - VITAMIN D 25 Hydroxy (Vit-D Deficiency, Fractures)  7. History of anemia: As symptomatic.  Denies heavy bleeding or blood in stool. - CBC with Differential/Platelet  8. Screening for HIV (human immunodeficiency virus) - HIV antibody   Follow-up in 1 months on diabetes and hyperlipidemia.  Wendee Beavers PGY-3 Pager [phone] 09/10/17  6:58 PM

## 2017-09-10 NOTE — Patient Instructions (Addendum)
It was great seeing you today!  Diabetes: Continue taking your medication for now.  I also recommend lifestyle change including diet and exercise.  Please see below for some tips on diet and exercise.  It is also very important that you see your dentist regularly.  Please come back and see [place] in 1 months.  If we did any lab work today, and the results require attention, either me or my nurse will get in touch with you. If everything is normal, you will get a letter in mail and a message via . If you don't hear from [place] in two weeks, please give [place] a call. Otherwise, we look forward to seeing you again at your next visit. If you have any questions or concerns before then, please call the clinic at [phone].  Please bring all your medications to every doctors visit  Sign up for My Chart to have easy access to your labs results, and communication with your Primary care physician.    Please check-out at the front desk before leaving the clinic.    Take Care,   Dr. Cyndia Skeeters  Portion Size   Choose healthier foods such as 100% whole grains, vegetables, fruits, beans, nut seeds, olive oil, most vegetable oils, fat-free dietary, wild game and fish.   Avoid sweet tea, other sweetened beverages, soda, fruit juice, cold cereal and milk and trans fat.   Eat at least 3 meals and 1-2 snacks per day.  Aim for no more than 5 hours between eating.  Eat breakfast within one hour of getting up.    Exercise at least 150 minutes per week, including weight resistance exercises 3 or 4 times per week.   Try to lose at least 7-10% of your current body weight.   Limit your salt (Sodium) intake to less than 2 gm (2000 mg) a day if you have conditions such as  elevated blood pressure, heart failure...    You may also read about DASH and/or Mediterranean diet at the following web site if you have blood pressure or heart  condition. [email or web address].se LACodes.co.za

## 2017-09-11 ENCOUNTER — Other Ambulatory Visit: Payer: Self-pay | Admitting: Student

## 2017-09-11 DIAGNOSIS — R7989 Other specified abnormal findings of blood chemistry: Secondary | ICD-10-CM

## 2017-09-11 LAB — CMP14+EGFR
ALT: 14 IU/L (ref 0–32)
AST: 11 IU/L (ref 0–40)
Albumin/Globulin Ratio: 2 (ref 1.2–2.2)
Albumin: 4.5 g/dL (ref 3.5–5.5)
Alkaline Phosphatase: 68 IU/L (ref 39–117)
BILIRUBIN TOTAL: 0.3 mg/dL (ref 0.0–1.2)
BUN/Creatinine Ratio: 13 (ref 9–23)
BUN: 10 mg/dL (ref 6–24)
CHLORIDE: 100 mmol/L (ref 96–106)
CO2: 27 mmol/L (ref 20–29)
Calcium: 9.4 mg/dL (ref 8.7–10.2)
Creatinine, Ser: 0.78 mg/dL (ref 0.57–1.00)
GFR, EST AFRICAN AMERICAN: 102 mL/min/{1.73_m2} (ref >59)
GFR, EST NON AFRICAN AMERICAN: 88 mL/min/{1.73_m2} (ref >59)
GLOBULIN, TOTAL: 2.3 g/dL (ref 1.5–4.5)
Glucose: 215 mg/dL — ABNORMAL HIGH (ref 65–99)
Potassium: 4.6 mmol/L (ref 3.5–5.2)
SODIUM: 142 mmol/L (ref 134–144)
TOTAL PROTEIN: 6.8 g/dL (ref 6.0–8.5)

## 2017-09-11 LAB — CBC WITH DIFFERENTIAL/PLATELET
BASOS ABS: 0 10*3/uL (ref 0.0–0.2)
BASOS: 0 %
EOS (ABSOLUTE): 0.2 10*3/uL (ref 0.0–0.4)
Eos: 4 %
Hematocrit: 33.6 % — ABNORMAL LOW (ref 34.0–46.6)
Hemoglobin: 11.2 g/dL (ref 11.1–15.9)
IMMATURE GRANS (ABS): 0 10*3/uL (ref 0.0–0.1)
Immature Granulocytes: 0 %
LYMPHS ABS: 1.8 10*3/uL (ref 0.7–3.1)
LYMPHS: 33 %
MCH: 29.2 pg (ref 26.6–33.0)
MCHC: 33.3 g/dL (ref 31.5–35.7)
MCV: 88 fL (ref 79–97)
Monocytes Absolute: 0.4 10*3/uL (ref 0.1–0.9)
Monocytes: 8 %
NEUTROS ABS: 3 10*3/uL (ref 1.4–7.0)
Neutrophils: 55 %
PLATELETS: 204 10*3/uL (ref 150–379)
RBC: 3.84 x10E6/uL (ref 3.77–5.28)
RDW: 13.6 % (ref 12.3–15.4)
WBC: 5.5 10*3/uL (ref 3.4–10.8)

## 2017-09-11 LAB — LIPID PANEL
CHOLESTEROL TOTAL: 131 mg/dL (ref 100–199)
Chol/HDL Ratio: 2.5 ratio (ref 0.0–4.4)
HDL: 52 mg/dL (ref >39)
LDL Calculated: 66 mg/dL (ref 0–99)
Triglycerides: 65 mg/dL (ref 0–149)
VLDL Cholesterol Cal: 13 mg/dL (ref 5–40)

## 2017-09-11 LAB — TSH: TSH: 0.264 u[IU]/mL — AB (ref 0.450–4.500)

## 2017-09-11 LAB — VITAMIN D 25 HYDROXY (VIT D DEFICIENCY, FRACTURES): Vit D, 25-Hydroxy: 49.1 ng/mL (ref 30.0–100.0)

## 2017-09-11 LAB — HIV ANTIBODY (ROUTINE TESTING W REFLEX): HIV SCREEN 4TH GENERATION: NONREACTIVE

## 2017-09-11 NOTE — Progress Notes (Signed)
TSH low. Added Free T4.

## 2017-09-13 ENCOUNTER — Encounter: Payer: Self-pay | Admitting: Student

## 2017-09-13 LAB — SPECIMEN STATUS REPORT

## 2017-09-13 LAB — T4, FREE: Free T4: 1.62 ng/dL (ref 0.82–1.77)

## 2017-10-08 ENCOUNTER — Encounter: Payer: Self-pay | Admitting: Student

## 2017-10-09 ENCOUNTER — Ambulatory Visit: Payer: BLUE CROSS/BLUE SHIELD | Admitting: Student

## 2017-10-12 ENCOUNTER — Ambulatory Visit (INDEPENDENT_AMBULATORY_CARE_PROVIDER_SITE_OTHER): Payer: Self-pay | Admitting: Student

## 2017-10-12 ENCOUNTER — Other Ambulatory Visit: Payer: Self-pay

## 2017-10-12 ENCOUNTER — Encounter: Payer: Self-pay | Admitting: Student

## 2017-10-12 VITALS — BP 136/64 | HR 95 | Temp 97.8°F | Ht 65.0 in | Wt 188.0 lb

## 2017-10-12 DIAGNOSIS — I10 Essential (primary) hypertension: Secondary | ICD-10-CM

## 2017-10-12 DIAGNOSIS — E782 Mixed hyperlipidemia: Secondary | ICD-10-CM

## 2017-10-12 DIAGNOSIS — E119 Type 2 diabetes mellitus without complications: Secondary | ICD-10-CM

## 2017-10-12 MED ORDER — METFORMIN HCL 1000 MG PO TABS: 1000.0000 mg | ORAL_TABLET | Freq: Two times a day (BID) | ORAL | 3 refills | Status: DC

## 2017-10-12 MED ORDER — ATORVASTATIN CALCIUM 40 MG PO TABS: 40.0000 mg | ORAL_TABLET | Freq: Every day | ORAL | 3 refills | Status: DC

## 2017-10-12 MED ORDER — EXENATIDE ER 2 MG ~~LOC~~ PEN: 2.0000 mg | PEN_INJECTOR | SUBCUTANEOUS | 3 refills | Status: DC

## 2017-10-12 MED ORDER — LISINOPRIL 20 MG PO TABS
20.0000 mg | ORAL_TABLET | Freq: Every day | ORAL | 3 refills | Status: DC
Start: 2017-10-12 — End: 2018-08-12

## 2017-10-12 MED ORDER — ZOSTER VAC RECOMB ADJUVANTED 50 MCG/0.5ML IM SUSR: 0.5000 mL | Freq: Once | INTRAMUSCULAR | 1 refills | Status: DC

## 2017-10-12 NOTE — Progress Notes (Signed)
Subjective:    Brandy Lynn is a 52 y.o. old female here for follow up  HPI Diabetes: last A1c is 8.1% about one month ago. Not sure about her goal A1c. She says she hasn't been taking her Exenatide ER as she was supposed to. She says she is worried it may drop her blood glucose too low. She is taking metformin 1000 mg twice a day. She says she is no longer on insulin. She is not exercising. She reports watching her diet. Doesn't eat outside. Doesn't drink soda or alcohol. Denies recent vision change, numbness or symptoms of hyperglycemia. She has recent eye exam. She has no dental home.  She says her insurance has just expired. She is applying for orange card.  PMH/Problem List: has Type 2 diabetes mellitus without complication, without long-term current use of insulin ([place]); Obesity; Podagra; HTN (hypertension); Mixed hyperlipidemia; Hemoptysis, unspecified; History of vitamin D deficiency; Chorioretinal scar, right; Keratoconjunctivitis sicca of both eyes not specified as Sjogren's; Macular drusen, bilateral; Mesenteric mass; Myopia of both eyes; Nuclear sclerotic cataract of both eyes; and Vitreous floater, bilateral on their problem list.   has a past medical history of Hemoptysis, unspecified (12/2012, 12/2013), HTN (hypertension) (11/12/2012), Hyperlipidemia, Obesity (12/09/2010), Podagra (09/24/2012), Pyelonephritis, Sepsis ([place]) (hospitalized 06/26/2015), Sickle cell trait ([place]), and Type II diabetes mellitus ([place]).  FH:  Family History  Problem Relation Age of Onset  . Diabetes Father   . Diabetes Sister   . Diabetes Brother   . Breast cancer Sister        breast cancer in her 73's and passed away about 61. Not sure if she passed away from [facility]  . Leukemia Brother     SH Social History   Tobacco Use  . Smoking status: Never Smoker  . Smokeless tobacco: Never Used  Substance Use Topics  . Alcohol use: No  . Drug use: No    Review of Systems Review of systems negative except for  pertinent positives and negatives in history of present illness above.     Objective:     Vitals:   10/12/17 0933  BP: 136/64  Pulse: 95  Temp: 97.8 F (36.6 C)  TempSrc: Oral  SpO2: 98%  Weight: 188 lb (85.3 kg)  Height: 5\' 5"  (1.651 m)   Body mass index is 31.28 kg/m.  Physical Exam  GEN: appears well, no apparent distress. HEM: negative for cervical or periauricular lymphadenopathies CVS: RRR, nl s1 & s2, no murmurs, no edema RESP: no IWOB SKIN: no apparent skin lesion ENDO: negative thyromegally NEURO: alert and oiented appropriately, no gross deficits   Diabetic Foot Exam: Inspection: no skin lesion, ulcer or callus. Toenails trimmed  Vascular: DP & PT pulses 2+ bilaterally Neuro: Intact 9-point monofilament exam bilaterally     Assessment and Plan:  1. Type 2 diabetes mellitus: suboptimally controlled. Last A1c 8.1% one month ago. Partly due to poor compliance with her medication and sedentary life style. Discussed the importance of taking her medication and life style change including diet and exercise.  I also discussed the importance of regular dental check up. Now, she has no insurance which is going to be a huge barrier. Follow up in three months. - Exenatide ER (BYDUREON) 2 MG PEN; Inject 2 mg into the skin once a week.  Dispense: 4 each; Refill: 3 - metFORMIN (GLUCOPHAGE) 1000 MG tablet; Take 1 tablet (1,000 mg total) by mouth 2 (two) times daily with a meal.  Dispense: 180 tablet; Refill: 3  2.  Mixed hyperlipidemia: has been on simvastatin 10 mg daily. She at least needs moderate intensity statin.  - atorvastatin (LIPITOR) 40 MG tablet; Take 1 tablet (40 mg total) by mouth daily.  Dispense: 90 tablet; Refill: 3  3. Hypertension, unspecified type: at goal - lisinopril (PRINIVIL,ZESTRIL) 20 MG tablet; Take 1 tablet (20 mg total) by mouth daily.  Dispense: 90 tablet; Refill: 3  Return in about 2 months (around 12/10/2017) for Diabetes.  Mercy Riding,  MD 10/12/17 Pager: [phone]

## 2017-10-12 NOTE — Patient Instructions (Addendum)
It was great seeing you today! We have addressed the following issues today  Diabetes: It is very important that you take your medication consistently.  You goal A1c is less than 7.5%.  We also recommend lifestyle change including diet and exercise.  See below for some tips on diet and exercise.  Recommend follow-up in 2 months for your diabetes.  Cholesterol: We stopped his simvastatin.  We started you on atorvastatin.  Immunization against shingles: We gave you a prescription for shingles vaccine.  You can take this to your pharmacy to have the shingles vaccine.  If we did any lab work today, and the results require attention, either me or my nurse will get in touch with you. If everything is normal, you will get a letter in mail and a message via . If you don't hear from [place] in two weeks, please give [place] a call. Otherwise, we look forward to seeing you again at your next visit. If you have any questions or concerns before then, please call the clinic at [phone].  Please bring all your medications to every doctors visit  Sign up for My Chart to have easy access to your labs results, and communication with your Primary care physician.    Please check-out at the front desk before leaving the clinic.    Take Care,   Dr. Cyndia Skeeters  Portion Size   Choose healthier foods such as 100% whole grains, vegetables, fruits, beans, nut seeds, olive oil, most vegetable oils, fat-free dietary, wild game and fish.   Avoid sweet tea, other sweetened beverages, soda, fruit juice, cold cereal and milk and trans fat.   Eat at least 3 meals and 1-2 snacks per day.  Aim for no more than 5 hours between eating.  Eat breakfast within one hour of getting up.    Exercise at least 150 minutes per week, including weight resistance exercises 3 or 4 times per week.   Try to lose at least 7-10% of your current body weight.   Limit your salt (Sodium) intake to less than 2 gm (2000 mg) a day if you have  conditions such as  elevated blood pressure, heart failure...    You may also read about DASH and/or Mediterranean diet at the following web site if you have blood pressure or heart condition. [email or web address].se LACodes.co.za

## 2017-10-15 ENCOUNTER — Other Ambulatory Visit: Payer: Self-pay

## 2017-10-15 ENCOUNTER — Other Ambulatory Visit: Payer: Self-pay | Admitting: Student

## 2017-10-15 DIAGNOSIS — E119 Type 2 diabetes mellitus without complications: Secondary | ICD-10-CM

## 2017-10-15 MED ORDER — GLIPIZIDE 10 MG PO TABS: 10.0000 mg | ORAL_TABLET | Freq: Two times a day (BID) | ORAL | 3 refills | Status: DC

## 2017-10-15 NOTE — Progress Notes (Signed)
Patient lost her insurance.  Exenatide ER costs over  $700 a month.  Changing to glipizide.  She reports taking glipizide in the past.  She is not sure why they took her off glipizide.  She states she does not want to use insulin.  Discussed the risk of hypoglycemia and weight gain with glipizide.  Discussed about the importance of eating regular meals.  Recommended not to take the glipizide if she happens to escape her meal.

## 2017-11-02 ENCOUNTER — Ambulatory Visit (HOSPITAL_COMMUNITY)
Admission: RE | Admit: 2017-11-02 | Discharge: 2017-11-02 | Disposition: A | Discharge status: Home / Self Care | Payer: Self-pay | Source: Ambulatory Visit | Attending: Family Medicine | Admitting: Family Medicine

## 2017-11-02 ENCOUNTER — Other Ambulatory Visit: Payer: Self-pay

## 2017-11-02 ENCOUNTER — Ambulatory Visit (INDEPENDENT_AMBULATORY_CARE_PROVIDER_SITE_OTHER): Payer: Self-pay | Admitting: Family Medicine

## 2017-11-02 ENCOUNTER — Encounter: Payer: Self-pay | Admitting: Family Medicine

## 2017-11-02 DIAGNOSIS — M79641 Pain in right hand: Secondary | ICD-10-CM | POA: Insufficient documentation

## 2017-11-02 MED ORDER — IBUPROFEN 600 MG PO TABS: 600.0000 mg | ORAL_TABLET | Freq: Four times a day (QID) | ORAL | 0 refills | Status: DC | PRN

## 2017-11-02 MED ORDER — ACETAMINOPHEN 325 MG PO TABS: 650.0000 mg | ORAL_TABLET | Freq: Four times a day (QID) | ORAL | 1 refills | Status: DC | PRN
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
## 2017-11-02 NOTE — Patient Instructions (Signed)
It was good to see you today!  For your pain, this is your treatment for the next 1 week - take over the counter tylenol 650mg  and ibuprofen 600mg  every 6 hours, we talked about how to alternate between - heating pad on your shoulders or warm towel  Get the R wrist xray, you can walk in for this without an appointment  Please bring all of your medications with you to each visit.   Sign up for My Chart to have easy access to your labs results, and communication with your primary care physician.  Feel free to call with any questions or concerns at any time, at [phone].   Take care,  Dr. Bufford Lope, [place]

## 2017-11-03 ENCOUNTER — Encounter: Payer: Self-pay | Admitting: Student

## 2017-11-03 ENCOUNTER — Telehealth: Payer: Self-pay

## 2017-11-03 NOTE — Telephone Encounter (Signed)
-----   Message from Bufford Lope, DO sent at 11/02/2017  4:55 PM EDT ----- Can patient please be called and informed that xray normal

## 2017-11-03 NOTE — Telephone Encounter (Signed)
Pt contacted and informed of normal xray results, pt did not have any further questions.

## 2017-12-07 ENCOUNTER — Ambulatory Visit: Payer: Self-pay | Admitting: Student

## 2017-12-14 ENCOUNTER — Encounter: Payer: Self-pay | Admitting: Student

## 2017-12-14 ENCOUNTER — Ambulatory Visit (INDEPENDENT_AMBULATORY_CARE_PROVIDER_SITE_OTHER): Payer: Self-pay | Admitting: Student

## 2017-12-14 ENCOUNTER — Other Ambulatory Visit: Payer: Self-pay

## 2017-12-14 VITALS — BP 140/70 | HR 90 | Temp 98.2°F | Ht 65.0 in | Wt 183.6 lb

## 2017-12-14 DIAGNOSIS — Z9189 Other specified personal risk factors, not elsewhere classified: Secondary | ICD-10-CM

## 2017-12-14 DIAGNOSIS — E119 Type 2 diabetes mellitus without complications: Secondary | ICD-10-CM

## 2017-12-14 DIAGNOSIS — Z1211 Encounter for screening for malignant neoplasm of colon: Secondary | ICD-10-CM

## 2017-12-14 LAB — POCT GLYCOSYLATED HEMOGLOBIN (HGB A1C): Hemoglobin A1C: 8.8

## 2017-12-14 MED ORDER — RANITIDINE HCL 150 MG PO CAPS: 150.0000 mg | ORAL_CAPSULE | Freq: Two times a day (BID) | ORAL | Status: DC

## 2017-12-14 NOTE — Patient Instructions (Addendum)
It was great seeing you today! We have addressed the following issues today Diabetes: your A1c is 8.8% today. It was 8.1%. Your goal A1c is 7.5%. See below for more information about A1c.  1. Continue taking your glipizide and metformin. 2. Eat regular meals (breakfast, lunch and dinner) around-the-clock. You may snack as needed. See below about few tips and recommendations on diet and exercise.  3. Watch for symptoms of low blood sugar (hypoglycemia). Read below about these symptoms and management. 4. Please come back and see [place] in 3 months. 5. Please bring all your medication bottles to that visit.   What is A1c:  The A1C test result reflects your average blood sugar level for the past two to three months. Specifically, the A1C test measures what percentage of your hemoglobin - a protein in red blood cells that carries oxygen - is coated with sugar (glycated). The higher your A1C level, the poorer your blood sugar control and the higher your risk of diabetes complications. Portion Size    Choose healthier foods such as 100% whole grains, vegetables, fruits, beans, nut seeds, olive oil, most vegetable oils, fat-free dietary, wild game and fish.   Avoid sweet tea, other sweetened beverages, soda, fruit juice, cold cereal and milk and trans fat.   Eat at least 3 meals and 1-2 snacks per day.  Aim for no more than 5 hours between eating.  Eat breakfast within one hour of getting up.    Exercise at least 150 minutes per week, including weight resistance exercises 3 or 4 times per week.   Try to lose at least 7-10% of your current body weight.   Hypoglycemia Hypoglycemia is when the sugar (glucose) level in the blood is too low. Symptoms of low blood sugar may include:  Feeling: ? Hungry. ? Worried or nervous (anxious). ? Sweaty and clammy. ? Confused. ? Dizzy. ? Sleepy. ? Sick to your stomach (nauseous).  Having: ? A fast heartbeat. ? A headache. ? A change in your  vision. ? Jerky movements that you cannot control (seizure). ? Nightmares. ? Tingling or no feeling (numbness) around the mouth, lips, or tongue.  Having trouble with: ? Talking. ? Paying attention (concentrating). ? Moving (coordination). ? Sleeping.  Shaking.  Passing out (fainting).  Getting upset easily (irritability).  Low blood sugar can happen to people who have diabetes and people who do not have diabetes. Low blood sugar can happen quickly, and it can be an emergency. Treating Low Blood Sugar Low blood sugar is often treated by eating or drinking something sugary right away. If you can think clearly and swallow safely, follow the 15:15 rule:  Take 15 grams of a fast-acting carb (carbohydrate). Some fast-acting carbs are: ? 1 tube of glucose gel. ? 3 sugar tablets (glucose pills). ? 6-8 pieces of hard candy. ? 4 oz (120 mL) of fruit juice. ? 4 oz (120 mL) of regular (not diet) soda.  Check your blood sugar 15 minutes after you take the carb.  If your blood sugar is still at or below 70 mg/dL (3.9 mmol/L), take 15 grams of a carb again.  If your blood sugar does not go above 70 mg/dL (3.9 mmol/L) after 3 tries, get help right away.  After your blood sugar goes back to normal, eat a meal or a snack within 1 hour.  Treating Very Low Blood Sugar If your blood sugar is at or below 54 mg/dL (3 mmol/L), you have very low blood sugar (severe  hypoglycemia). This is an emergency. Do not wait to see if the symptoms will go away. Get medical help right away. Call your local emergency services (911 in the [place] Do not drive yourself to the hospital. If you have very low blood sugar and you cannot eat or drink, you may need a glucagon shot (injection). A family member or friend should learn how to check your blood sugar and how to give you a glucagon shot. Ask your doctor if you need to have a glucagon shot kit at home. Follow these instructions at home: General  instructions  Avoid any diets that cause you to not eat enough food. Talk with your doctor before you start any new diet.  Take over-the-counter and prescription medicines only as told by your doctor.  Limit alcohol to no more than 1 drink per day for nonpregnant women and 2 drinks per day for men. One drink equals 12 oz of beer, 5 oz of wine, or 1 oz of hard liquor.  Keep all follow-up visits as told by your doctor. This is important. If You Have Diabetes:   Make sure you know the symptoms of low blood sugar.  Always keep a source of sugar with you, such as: ? Sugar. ? Sugar tablets. ? Glucose gel. ? Fruit juice. ? Regular soda (not diet soda). ? Milk. ? Hard candy. ? Honey.  Take your medicines as told.  Follow your exercise and meal plan. ? Eat on time. Do not skip meals. ? Follow your sick day plan when you cannot eat or drink normally. Make this plan ahead of time with your doctor.  Check your blood sugar as often as told by your doctor. Always check before and after exercise.  Share your diabetes care plan with: ? Your work or school. ? People you live with.  Check your pee (urine) for ketones: ? When you are sick. ? As told by your doctor.  Carry a card or wear jewelry that says you have diabetes. If You Have Low Blood Sugar From Other Causes:   Check your blood sugar as often as told by your doctor.  Follow instructions from your doctor about what you cannot eat or drink. Contact a doctor if:  You have trouble keeping your blood sugar in your target range.  You have low blood sugar often. Get help right away if:  You still have symptoms after you eat or drink something sugary.  Your blood sugar is at or below 54 mg/dL (3 mmol/L).  You have jerky movements that you cannot control.  You pass out. These symptoms may be an emergency. Do not wait to see if the symptoms will go away. Get medical help right away. Call your local emergency services (911  in the [place] Do not drive yourself to the hospital. This information is not intended to replace advice given to you by your health care provider. Make sure you discuss any questions you have with your health care provider. Document Released: 10/29/2009 Document Revised: 01/10/2016 Document Reviewed: 09/07/2015 Elsevier Interactive Patient Education  [employer].
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
## 2017-12-30 ENCOUNTER — Telehealth: Payer: Self-pay | Admitting: Student

## 2017-12-30 NOTE — Telephone Encounter (Signed)
Called MAP programs about the diabetic medication options for this patient.  Was advised that patient has to apply for medication assistance.  Call patient and scheduled follow-up appointment with me on January 20, 2018 at 1:50 PM to discuss about the options and give her information about MAP application. Patient appreciative about the call

## 2017-12-30 NOTE — Telephone Encounter (Signed)
-----   Message from Mercy Riding, MD sent at 12/14/2017 12:09 PM EDT ----- Call MAP for diabetic medication options [phone]

## 2018-01-13 ENCOUNTER — Other Ambulatory Visit: Payer: Self-pay

## 2018-01-13 ENCOUNTER — Encounter (HOSPITAL_BASED_OUTPATIENT_CLINIC_OR_DEPARTMENT_OTHER): Payer: Self-pay

## 2018-01-13 ENCOUNTER — Emergency Department (HOSPITAL_BASED_OUTPATIENT_CLINIC_OR_DEPARTMENT_OTHER)
Admission: EM | Admit: 2018-01-13 | Discharge: 2018-01-13 | Disposition: A | Discharge status: Home / Self Care | Payer: Medicaid Other | Attending: Emergency Medicine | Admitting: Emergency Medicine

## 2018-01-13 DIAGNOSIS — I10 Essential (primary) hypertension: Secondary | ICD-10-CM | POA: Insufficient documentation

## 2018-01-13 DIAGNOSIS — Z7984 Long term (current) use of oral hypoglycemic drugs: Secondary | ICD-10-CM | POA: Insufficient documentation

## 2018-01-13 DIAGNOSIS — M5431 Sciatica, right side: Secondary | ICD-10-CM | POA: Insufficient documentation

## 2018-01-13 DIAGNOSIS — E119 Type 2 diabetes mellitus without complications: Secondary | ICD-10-CM | POA: Insufficient documentation

## 2018-01-13 DIAGNOSIS — Z79899 Other long term (current) drug therapy: Secondary | ICD-10-CM | POA: Insufficient documentation

## 2018-01-13 DIAGNOSIS — Z7982 Long term (current) use of aspirin: Secondary | ICD-10-CM | POA: Insufficient documentation

## 2018-01-13 MED ORDER — KETOROLAC TROMETHAMINE 60 MG/2ML IM SOLN
60.0000 mg | Freq: Once | INTRAMUSCULAR | Status: AC
  Administered 2018-01-13: 60 mg via INTRAMUSCULAR
  Filled 2018-01-13: qty 2

## 2018-01-13 MED ORDER — PREDNISONE 10 MG PO TABS: 20.0000 mg | ORAL_TABLET | Freq: Two times a day (BID) | ORAL | 0 refills | Status: DC

## 2018-01-13 MED ORDER — HYDROCODONE-ACETAMINOPHEN 5-325 MG PO TABS: 1.0000 | ORAL_TABLET | Freq: Four times a day (QID) | ORAL | 0 refills | Status: DC | PRN

## 2018-01-13 MED ORDER — OXYCODONE-ACETAMINOPHEN 5-325 MG PO TABS
2.0000 | ORAL_TABLET | Freq: Once | ORAL | Status: AC
  Administered 2018-01-13: 2 via ORAL
  Filled 2018-01-13: qty 2

## 2018-01-13 NOTE — Discharge Instructions (Addendum)
Prednisone as prescribed.  Hydrocodone as prescribed as needed for pain.  Follow-up with your primary doctor if symptoms are not improving in the next week.
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
## 2018-01-20 ENCOUNTER — Ambulatory Visit: Payer: Self-pay | Admitting: Student

## 2018-02-14 DIAGNOSIS — I10 Essential (primary) hypertension: Secondary | ICD-10-CM | POA: Insufficient documentation

## 2018-02-14 DIAGNOSIS — Z79899 Other long term (current) drug therapy: Secondary | ICD-10-CM | POA: Insufficient documentation

## 2018-02-14 DIAGNOSIS — Z7984 Long term (current) use of oral hypoglycemic drugs: Secondary | ICD-10-CM | POA: Insufficient documentation

## 2018-02-14 DIAGNOSIS — Z7982 Long term (current) use of aspirin: Secondary | ICD-10-CM | POA: Insufficient documentation

## 2018-02-14 DIAGNOSIS — N390 Urinary tract infection, site not specified: Secondary | ICD-10-CM | POA: Insufficient documentation

## 2018-02-14 DIAGNOSIS — E119 Type 2 diabetes mellitus without complications: Secondary | ICD-10-CM | POA: Insufficient documentation

## 2018-02-14 NOTE — ED Triage Notes (Addendum)
Pt c/o lower back pain that started yesterday with painful urination. States urine is cloudy. Denies any fevers. States hx of kidney infections. Hx of sickle cell trait.

## 2018-02-15 ENCOUNTER — Emergency Department (HOSPITAL_BASED_OUTPATIENT_CLINIC_OR_DEPARTMENT_OTHER)
Admission: EM | Admit: 2018-02-15 | Discharge: 2018-02-15 | Disposition: A | Discharge status: Home / Self Care | Payer: Medicaid Other | Attending: Emergency Medicine | Admitting: Emergency Medicine

## 2018-02-15 ENCOUNTER — Encounter (HOSPITAL_BASED_OUTPATIENT_CLINIC_OR_DEPARTMENT_OTHER): Payer: Self-pay | Admitting: *Deleted

## 2018-02-15 ENCOUNTER — Other Ambulatory Visit: Payer: Self-pay

## 2018-02-15 DIAGNOSIS — N390 Urinary tract infection, site not specified: Secondary | ICD-10-CM

## 2018-02-15 LAB — URINALYSIS, ROUTINE W REFLEX MICROSCOPIC
Bilirubin Urine: NEGATIVE
GLUCOSE, UA: NEGATIVE mg/dL
Ketones, ur: NEGATIVE mg/dL
Nitrite: NEGATIVE
PROTEIN: NEGATIVE mg/dL
Specific Gravity, Urine: <1.005 — ABNORMAL LOW (ref 1.005–1.030)
pH: 6.5 (ref 5.0–8.0)

## 2018-02-15 LAB — BASIC METABOLIC PANEL
Anion gap: 8 (ref 5–15)
BUN: 12 mg/dL (ref 6–20)
CHLORIDE: 103 mmol/L (ref 98–111)
CO2: 29 mmol/L (ref 22–32)
CREATININE: 1.02 mg/dL — AB (ref 0.44–1.00)
Calcium: 9.5 mg/dL (ref 8.9–10.3)
GFR calc Af Amer: >60 mL/min (ref >60)
GFR calc non Af Amer: >60 mL/min (ref >60)
Glucose, Bld: 140 mg/dL — ABNORMAL HIGH (ref 70–99)
Potassium: 4.1 mmol/L (ref 3.5–5.1)
Sodium: 140 mmol/L (ref 135–145)

## 2018-02-15 LAB — CBC WITH DIFFERENTIAL/PLATELET
Basophils Absolute: 0 10*3/uL (ref 0.0–0.1)
Basophils Relative: 0 %
EOS PCT: 7 %
Eosinophils Absolute: 0.5 10*3/uL (ref 0.0–0.7)
HEMATOCRIT: 34.5 % — AB (ref 36.0–46.0)
HEMOGLOBIN: 12 g/dL (ref 12.0–15.0)
LYMPHS ABS: 1.9 10*3/uL (ref 0.7–4.0)
LYMPHS PCT: 26 %
MCH: 29.6 pg (ref 26.0–34.0)
MCHC: 34.8 g/dL (ref 30.0–36.0)
MCV: 85 fL (ref 78.0–100.0)
MONO ABS: 0.6 10*3/uL (ref 0.1–1.0)
Monocytes Relative: 8 %
Neutro Abs: 4.3 10*3/uL (ref 1.7–7.7)
Neutrophils Relative %: 59 %
Platelets: ADEQUATE 10*3/uL (ref 150–400)
RBC: 4.06 MIL/uL (ref 3.87–5.11)
RDW: 12.9 % (ref 11.5–15.5)
WBC: 7.4 10*3/uL (ref 4.0–10.5)

## 2018-02-15 LAB — URINALYSIS, MICROSCOPIC (REFLEX): WBC, UA: >50 WBC/hpf (ref 0–5)

## 2018-02-15 MED ORDER — OXYCODONE-ACETAMINOPHEN 5-325 MG PO TABS
1.0000 | ORAL_TABLET | Freq: Once | ORAL | Status: AC
  Administered 2018-02-15: 1 via ORAL
  Filled 2018-02-15: qty 1

## 2018-02-15 MED ORDER — CEFTRIAXONE SODIUM 1 G IJ SOLR
1.0000 g | Freq: Once | INTRAMUSCULAR | Status: AC
  Administered 2018-02-15: 1 g via INTRAMUSCULAR
  Filled 2018-02-15: qty 10

## 2018-02-15 MED ORDER — LIDOCAINE HCL (PF) 1 % IJ SOLN
INTRAMUSCULAR | Status: AC
  Filled 2018-02-15: qty 5

## 2018-02-15 MED ORDER — CEPHALEXIN 500 MG PO CAPS: 500.0000 mg | ORAL_CAPSULE | Freq: Three times a day (TID) | ORAL | 0 refills | Status: DC

## 2018-02-15 MED ORDER — NAPROXEN 500 MG PO TABS: 500.0000 mg | ORAL_TABLET | Freq: Two times a day (BID) | ORAL | 0 refills | Status: DC

## 2018-02-15 NOTE — Discharge Instructions (Addendum)
You were seen today for back pain and urinary symptoms.  Your work-up was concerning for urinary tract infection.  Take medications as prescribed.  If you develop fever, nausea, vomiting, worsening pain you should be reevaluated immediately.
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
## 2018-02-17 LAB — URINE CULTURE

## 2018-02-18 ENCOUNTER — Telehealth: Payer: Self-pay | Admitting: *Deleted

## 2018-02-18 NOTE — Telephone Encounter (Signed)
Post ED Visit - Positive Culture Follow-up  Culture report reviewed by antimicrobial stewardship pharmacist:  []  Elenor Quinones, Pharm.D. []  Heide Guile, Pharm.D., BCPS AQ-ID []  Parks Neptune, Pharm.D., BCPS []  Alycia Rossetti, Pharm.D., BCPS []  [place], Pharm.D., BCPS, AAHIVP []  Legrand Como, Pharm.D., BCPS, AAHIVP []  Salome Arnt, PharmD, BCPS []  Johnnette Gourd, PharmD, BCPS []  Hughes Better, PharmD, BCPS []  Leeroy Cha, PharmD Lucio Edward, PharmD  Positive urine culture Treated with Cephalexin, organism sensitive to the same and no further patient follow-up is required at this time.  Harlon Flor [facility] 02/18/2018, 10:32 AM

## 2018-02-25 ENCOUNTER — Encounter: Payer: Self-pay | Admitting: Family Medicine

## 2018-02-25 ENCOUNTER — Ambulatory Visit (INDEPENDENT_AMBULATORY_CARE_PROVIDER_SITE_OTHER): Payer: Self-pay | Admitting: Family Medicine

## 2018-02-25 ENCOUNTER — Other Ambulatory Visit: Payer: Self-pay

## 2018-02-25 VITALS — BP 148/66 | HR 95 | Temp 98.4°F | Ht 65.0 in | Wt 179.2 lb

## 2018-02-25 DIAGNOSIS — N3000 Acute cystitis without hematuria: Secondary | ICD-10-CM

## 2018-02-25 DIAGNOSIS — E119 Type 2 diabetes mellitus without complications: Secondary | ICD-10-CM

## 2018-02-25 LAB — POCT GLYCOSYLATED HEMOGLOBIN (HGB A1C): HbA1c, POC (controlled diabetic range): 8.4 % — AB (ref 0.0–7.0)

## 2018-02-25 NOTE — Patient Instructions (Signed)
It was nice to meet you today Ms. Ch[place],   During your visit today we talked about:   - your UTI.  Continue taking your medicine until it runs out, which you said was today.  If your symptoms come back, make an appointment with [place] and we can treat it again.    - Your diabetes: your A1c is 8.4.  Before we change any medication it would be best for you to get the orange card.  You can make an appointment with Melvyn Neth about this at the front desk when you leave. Continue to try and limit your sugar intake and try to exercise when you can, even if it is just walking for 30 minutes a day.    Thank you for letting me treat you today,   Clemetine Marker, MD

## 2018-02-25 NOTE — Progress Notes (Signed)
   [facility] Phone: [phone]  Subjective:  Patient came here today to discuss her recent UTI and her diabetes.    UTI: Patient recently visited the emergency department for symptoms of UTI including dysuria, flank pain, and cloudy urine.  She was sent home with keflex and today was her last dose.  She states the symptoms have resolved.    Diabetes: patient is currently taking metformin 1000mg  BID and glipizide 10mg  BID.  She states she used to be on bydureon until she had issues with insurance and stated that worked better.  Patient states she would like to start exercising more and states she tries not to use sugar but did state she uses honey and brown sugar to sweeten her drinks.  Patient states she has been trying to set up a financial appointment to get her orange card but states that she has not had success doing that.    ROS: See HPI for pertinent positives and negatives  Family history reviewed for today's visit. No changes.  Social history- patient is a non smoker  Objective: BP (!) 148/66   Pulse 95   Temp 98.4 F (36.9 C) (Oral)   Ht 5\' 5"  (1.651 m)   Wt 179 lb 3.2 oz (81.3 kg)   SpO2 98%   BMI 29.82 kg/m  Gen: NAD, alert, cooperative with exam HEENT: NCAT, CV: RRR, no murmur Resp: CTABL, no wheezes, normal work of breathing GU: no CVA tenderness Neuro: Alert and oriented, no gross deficits Skin: No rashes, no lesions Psych: Appropriate behavior  Assessment/Plan:  UTI - > 100k colonies E.coli confirmed with urine culture. Patient symptoms resolved with use of cephalosporin Abx.  - patient informed to return to clinic for treatment if symptoms return.   Diabetes - A1c 8.4 on metformin and glipizide.  Patient preferred bydureon but unable to continue using it due to insurance issues.  - continue attempting to get orange card so that patient may have access to more diabetic medication.   - counseled on benefits of diet and exercise.  Patient agreed this would be beneficial.  - patient asked to followup in one month or sooner hopefully after having successfully received the orange card.    Clemetine Marker, MD PGY-1

## 2018-03-08 ENCOUNTER — Other Ambulatory Visit: Payer: Self-pay

## 2018-03-08 ENCOUNTER — Emergency Department (HOSPITAL_BASED_OUTPATIENT_CLINIC_OR_DEPARTMENT_OTHER)
Admission: EM | Admit: 2018-03-08 | Discharge: 2018-03-08 | Disposition: A | Discharge status: Home / Self Care | Payer: Self-pay | Attending: Emergency Medicine | Admitting: Emergency Medicine

## 2018-03-08 ENCOUNTER — Emergency Department (HOSPITAL_BASED_OUTPATIENT_CLINIC_OR_DEPARTMENT_OTHER): Payer: Self-pay

## 2018-03-08 ENCOUNTER — Encounter (HOSPITAL_BASED_OUTPATIENT_CLINIC_OR_DEPARTMENT_OTHER): Payer: Self-pay | Admitting: *Deleted

## 2018-03-08 DIAGNOSIS — E119 Type 2 diabetes mellitus without complications: Secondary | ICD-10-CM | POA: Insufficient documentation

## 2018-03-08 DIAGNOSIS — I10 Essential (primary) hypertension: Secondary | ICD-10-CM | POA: Insufficient documentation

## 2018-03-08 DIAGNOSIS — J069 Acute upper respiratory infection, unspecified: Secondary | ICD-10-CM | POA: Insufficient documentation

## 2018-03-08 DIAGNOSIS — B9789 Other viral agents as the cause of diseases classified elsewhere: Secondary | ICD-10-CM | POA: Insufficient documentation

## 2018-03-08 MED ORDER — IBUPROFEN 800 MG PO TABS: 800.0000 mg | ORAL_TABLET | Freq: Three times a day (TID) | ORAL | 0 refills | Status: DC | PRN

## 2018-03-08 MED ORDER — BENZONATATE 100 MG PO CAPS: 100.0000 mg | ORAL_CAPSULE | Freq: Three times a day (TID) | ORAL | 0 refills | Status: DC | PRN

## 2018-03-08 MED ORDER — FLUTICASONE PROPIONATE 50 MCG/ACT NA SUSP: 2.0000 | Freq: Every day | NASAL | 0 refills | Status: DC
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
## 2018-03-08 NOTE — Discharge Instructions (Signed)

## 2018-03-08 NOTE — ED Triage Notes (Signed)
Pt c/o 5 days URI symptoms

## 2018-03-08 NOTE — ED Notes (Signed)
Patient transported to X-ray 

## 2018-03-10 ENCOUNTER — Ambulatory Visit: Payer: Medicaid Other

## 2018-03-22 ENCOUNTER — Ambulatory Visit (INDEPENDENT_AMBULATORY_CARE_PROVIDER_SITE_OTHER): Payer: Self-pay | Admitting: Family Medicine

## 2018-03-22 ENCOUNTER — Encounter: Payer: Self-pay | Admitting: Family Medicine

## 2018-03-22 ENCOUNTER — Other Ambulatory Visit: Payer: Self-pay

## 2018-03-22 VITALS — BP 126/70 | HR 84 | Temp 97.8°F | Ht 65.0 in | Wt 178.0 lb

## 2018-03-22 DIAGNOSIS — R1110 Vomiting, unspecified: Secondary | ICD-10-CM

## 2018-03-22 DIAGNOSIS — R109 Unspecified abdominal pain: Secondary | ICD-10-CM | POA: Insufficient documentation

## 2018-03-22 MED ORDER — OMEPRAZOLE 20 MG PO CPDR: 20.0000 mg | DELAYED_RELEASE_CAPSULE | Freq: Every day | ORAL | Status: DC

## 2018-03-22 MED ORDER — OMEPRAZOLE 20 MG PO CPDR: 20.0000 mg | DELAYED_RELEASE_CAPSULE | Freq: Every day | ORAL | 3 refills | Status: DC

## 2018-03-22 NOTE — Patient Instructions (Addendum)
It was nice to see you again,   I have included a list of things we talked about during our visit below:   - We still need to fill out your paperwork for the orange card before we can change your diabetes medications.   - for your stomach pain I would like to you to start taking a medicine called omeprazole (Prilosec) 20mg  once a day and then come back and see me in a month to see if your symptoms have improved.  If they have not we can schedule you for that ultrasound of your stomach we talked about.    - I did not see anything with your eyes today that were concerning.    Thank you for letting me help you today,   Clemetine Marker, MD.

## 2018-03-22 NOTE — Progress Notes (Signed)
   [facility] Phone: [phone]  Subjective:  Patient was scheduled for this visit to follow up on her orange card application.  Patient has not returned paperwork for this yet.    Abdominal pain: for the past week the patient has had at least three days where she gets RUQ pain and nonbloody vomiting after a meal, specifically 'any leftovers'.  After vomiting up all her meal she will feel better but will not want to eat anything for a while.  She states she has had these episodes before but she could not pinpoint exactly when they started.  They have become more frequent over the past week. She does not drink alcohol.  She states ranitidine has helped but she only takes it occasionally.   Patient states she has started exercising by walking 15-20 minutes every morning.   ROS: See HPI for pertinent positives and negatives  Family history reviewed for today's visit. No changes.   Objective: BP 126/70   Pulse 84   Temp 97.8 F (36.6 C) (Oral)   Ht 5\' 5"  (1.651 m)   Wt 178 lb (80.7 kg)   SpO2 98%   BMI 29.62 kg/m  Gen: NAD, alert, cooperative with exam HEENT: NCAT, EOMI, MMM CV: RRR, no murmur Resp: LCTAB, no wheezes, normal work of breathing GI: normal bowel sounds.  No tenderness to palpation.  Negative murphy's sign.   Msk: No edema, warm, normal tone, moves UE/LE spontaneously Neuro: Alert and oriented, no gross deficits Psych: Appropriate behavior  Assessment/Plan: Abdominal pain with vomiting DDx: biliary colic, gastric ulcer, GERD.  Most likely biliary colic due to RUQ pain and relation to eating, but patient states she has felt better using ranitidine but only uses it occasionally.  Was given prescription for omeprazole daily and told to follow up in a month to see if she is still having symptoms. If still symptomatic we can schedule a RUQ ultrasound.  Patient told to go to hospital if the pain and vomiting do not go away after a while or if she  develops fever with the pain.   Orange Card Assistance:  Patient still has not filled out her paperwork for the orange card.  Paperwork was handed to the patient with instructions to fill out and return so that she could be scheduled an appointment to proceed with the application.    Clemetine Marker, MD PGY-1

## 2018-03-22 NOTE — Assessment & Plan Note (Signed)
DDx: biliary colic, gastric ulcer, GERD.  Most likely biliary colic due to RUQ pain and relation to eating, but patient states she has felt better using ranitidine but only uses it occasionally.  Was given prescription for omeprazole daily and told to follow up in a month to see if she is still having symptoms. If still symptomatic we can schedule a RUQ ultrasound.  Patient told to go to hospital if the pain and vomiting do not go away after a while or if she develops fever with the pain.

## 2018-03-29 ENCOUNTER — Other Ambulatory Visit: Payer: Self-pay | Admitting: *Deleted

## 2018-03-29 DIAGNOSIS — E119 Type 2 diabetes mellitus without complications: Secondary | ICD-10-CM

## 2018-03-29 MED ORDER — GLIPIZIDE 10 MG PO TABS: 10.0000 mg | ORAL_TABLET | Freq: Two times a day (BID) | ORAL | 3 refills | Status: DC

## 2018-06-03 ENCOUNTER — Ambulatory Visit (INDEPENDENT_AMBULATORY_CARE_PROVIDER_SITE_OTHER): Payer: Self-pay | Admitting: Family Medicine

## 2018-06-03 ENCOUNTER — Other Ambulatory Visit: Payer: Self-pay

## 2018-06-03 VITALS — BP 144/70 | HR 93 | Temp 98.3°F | Ht 65.0 in | Wt 180.0 lb

## 2018-06-03 DIAGNOSIS — B9789 Other viral agents as the cause of diseases classified elsewhere: Secondary | ICD-10-CM

## 2018-06-03 DIAGNOSIS — J069 Acute upper respiratory infection, unspecified: Secondary | ICD-10-CM

## 2018-06-03 NOTE — Patient Instructions (Signed)
Thank you for coming to see me today. It was a pleasure! Today we talked about:   You have a cold and it should start to get better about 7 - 10 days after it started.    For your cough, try honey.  For your nasal congestion and runny nose, try using Afrin (generic is Oxymetazoline) twice daily for 3 days.  Do not use for longer that 3 days.    Some other therapies you can try are: push fluids, rest and return office visit prn if symptoms persist or worsen.   Drinking warm liquids such as teas and soups can help with secretions and cough. A mist humidifier or vaporizer can work well to help with secretions and cough.  It is very important to clean the humidifier between use according to the instructions.    It was good to see you.  If you're still having trouble in the next week, come back and see [place].    Of course, if you start having trouble breathing, worsening fevers, vomiting and unable to hold down any fluids, or you have other concerns, don't hesitate to come back or go to the ED after hours.   Please follow-up with your primary care physician as needed.  If you have any questions or concerns, please do not hesitate to call the office at [phone].  Take Care,   [place] Aalijah Mims, DO
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
## 2018-07-05 ENCOUNTER — Telehealth: Payer: Self-pay | Admitting: Family Medicine

## 2018-07-05 ENCOUNTER — Other Ambulatory Visit: Payer: Self-pay | Admitting: Family Medicine

## 2018-07-05 DIAGNOSIS — E119 Type 2 diabetes mellitus without complications: Secondary | ICD-10-CM

## 2018-07-05 MED ORDER — METFORMIN HCL 1000 MG PO TABS: 1000.0000 mg | ORAL_TABLET | Freq: Two times a day (BID) | ORAL | 3 refills | Status: DC

## 2018-07-05 MED ORDER — GLIPIZIDE 10 MG PO TABS: 10.0000 mg | ORAL_TABLET | Freq: Two times a day (BID) | ORAL | 3 refills | Status: DC

## 2018-07-05 NOTE — Progress Notes (Signed)
Refilled patient's glipizide and metformin.  Would like to see her back as soon as she is able to discuss her diabetes and abdominal pain.

## 2018-07-05 NOTE — Telephone Encounter (Signed)
Pt is calling to ask for her Glipizide and Metformin to be refilled. She said she has called her pharmacy multiple times and they told her they were not able to send a request. Pt would like for someone to call her when the refill has been sent. She has not had her medication since Saturday. Best contact number is [phone].

## 2018-07-08 NOTE — Progress Notes (Signed)
Contacted pt and appointment scheduled also scheduled husband on same day at pt request. Katharina Caper, Tareva Leske D, CMA

## 2018-07-22 ENCOUNTER — Ambulatory Visit (INDEPENDENT_AMBULATORY_CARE_PROVIDER_SITE_OTHER): Payer: Self-pay | Admitting: Family Medicine

## 2018-07-22 VITALS — BP 135/80 | HR 95 | Temp 97.9°F | Wt 187.6 lb

## 2018-07-22 DIAGNOSIS — E119 Type 2 diabetes mellitus without complications: Secondary | ICD-10-CM

## 2018-07-22 DIAGNOSIS — R1110 Vomiting, unspecified: Secondary | ICD-10-CM

## 2018-07-22 DIAGNOSIS — R109 Unspecified abdominal pain: Secondary | ICD-10-CM

## 2018-07-22 LAB — POCT GLYCOSYLATED HEMOGLOBIN (HGB A1C): HbA1c, POC (controlled diabetic range): 7.7 % — AB (ref 0.0–7.0)

## 2018-07-22 NOTE — Progress Notes (Signed)
   [facility] Phone: [phone]   cc: gas, diabetes follow up  Subjective:  Patient previously came to clinic for abdominal pain.  She states her pain is gone but it has been replaced with a lot of gas. she Is very bloated and passing gas but not belching. she  Has been taking ranitidine and thinks this helps.   She is not having diarrhea but an  Increased frquency of bowel movement.  Before she has a bowel movement she has a lot of gas.  She has No pain after eating.  She did not change her diet in any way.   Has vomiting sometimes after meals, especially leftovers, which is consistent with her previous histories. The last time she vomited was Two days ago  And before that it was two weeks ago. Always after leftovers. She has stopped trying to get the orange card but has applied for [facility] and will get a colonoscopy in january when her insurance takes effect.   Patient wants to travel next month to [place], [place] on January 17th.  She Would like to get vaccinations before then.     ROS: See HPI for pertinent positives and negatives  Past Medical History  Family history reviewed for today's visit. No changes.  Social history- patient is a non smoker  Objective: BP 135/80   Pulse 95   Temp 97.9 F (36.6 C) (Oral)   Wt 187 lb 9.6 oz (85.1 kg)   SpO2 100%   BMI 31.22 kg/m  Gen: NAD, alert and oriented, cooperative with exam HEENT: NCAT, EOMI, MMM CV: normal rate, regular rhythm. No murmurs, no rubs.  Resp: LCTAB, no wheezes, crackles. normal work of breathing GI: nontender to palpation, BS present, no hyperactive bowel sounds noted.  no guarding.  Firm, nontender mass felt in LUQ.  Msk: No edema, warm, normal tone, moves UE/LE spontaneously Neuro: CN II-XII grossly intact.  Skin: No rashes, no lesions Psych: Appropriate behavior  Assessment/Plan: Type 2 diabetes mellitus without complication, without long-term current use of insulin ([facility]) a1c is 7.7%  which is an improvement from last time.  Told patient to continue taking metformin and watching how much sugar she consumes.    Abdominal pain with vomiting Patient's abdominal pain has resolved from last time, but now she has excessive gas.  She has vomiting still after leftovers only,but problem is not excessive enough to prevent her from eating leftovers.  She was nontender on exam but I felt a mass in her LUQ.  It could be stool burden but she has a history of desmoid tumor.  If pain continues to be a problem with her it may be beneficial to image her abdomen again.      Clemetine Marker, MD PGY-1

## 2018-07-22 NOTE — Patient Instructions (Signed)
Your A1c is 7.7 today, which is improved from last time! Please continue to take your metformin and avoid sugary foods and drinks.    There are no good treatments for gas.  You can try some over the counter medications like Gas-X or Beano.    As for vaccinations that you would need before traveling to [place], you can get those at the health department.  I have printed a list from the cdc traveler's website that lists the suggested vaccinations.    Have a great day,   Clemetine Marker, MD

## 2018-07-22 NOTE — Assessment & Plan Note (Signed)
a1c is 7.7% which is an improvement from last time.  Told patient to continue taking metformin and watching how much sugar she consumes.

## 2018-07-22 NOTE — Assessment & Plan Note (Signed)
Patient's abdominal pain has resolved from last time, but now she has excessive gas.  She has vomiting still after leftovers only,but problem is not excessive enough to prevent her from eating leftovers.  She was nontender on exam but I felt a mass in her LUQ.  It could be stool burden but she has a history of desmoid tumor.  If pain continues to be a problem with her it may be beneficial to image her abdomen again.

## 2018-08-12 ENCOUNTER — Other Ambulatory Visit: Payer: Self-pay

## 2018-08-12 DIAGNOSIS — I10 Essential (primary) hypertension: Secondary | ICD-10-CM

## 2018-08-13 MED ORDER — LISINOPRIL 20 MG PO TABS: 20.0000 mg | ORAL_TABLET | Freq: Every day | ORAL | 3 refills | Status: DC

## 2018-08-19 ENCOUNTER — Other Ambulatory Visit: Payer: Self-pay

## 2018-08-19 DIAGNOSIS — E782 Mixed hyperlipidemia: Secondary | ICD-10-CM

## 2018-08-20 MED ORDER — ATORVASTATIN CALCIUM 40 MG PO TABS: 40.0000 mg | ORAL_TABLET | Freq: Every day | ORAL | 3 refills | Status: DC

## 2018-08-20 NOTE — Telephone Encounter (Signed)
2nd faxed request from pharmacy. Fleeger, Salome Spotted, CMA

## 2019-04-20 ENCOUNTER — Other Ambulatory Visit: Payer: Self-pay | Admitting: Family Medicine

## 2019-04-20 DIAGNOSIS — E119 Type 2 diabetes mellitus without complications: Secondary | ICD-10-CM

## 2019-04-21 NOTE — Telephone Encounter (Signed)
Contacted pt and informed her of her refill and asked about scheduling an appointment and she said that she is now going to [facility] due to her insurance [employer].  Will remove Brandy Lynn as PCP and will route to him as an Pharmacist, hospital. Cordaryl Decelles Zimmerman Rumple, CMA

## 2019-06-28 ENCOUNTER — Other Ambulatory Visit: Payer: Self-pay | Admitting: Family Medicine

## 2019-06-28 DIAGNOSIS — E119 Type 2 diabetes mellitus without complications: Secondary | ICD-10-CM

## 2019-08-30 ENCOUNTER — Other Ambulatory Visit: Payer: Self-pay | Admitting: Family Medicine

## 2019-08-30 DIAGNOSIS — E782 Mixed hyperlipidemia: Secondary | ICD-10-CM

## 2019-08-31 ENCOUNTER — Other Ambulatory Visit: Payer: Self-pay | Admitting: Family Medicine

## 2019-08-31 DIAGNOSIS — E782 Mixed hyperlipidemia: Secondary | ICD-10-CM

## 2019-09-08 ENCOUNTER — Telehealth: Payer: Self-pay | Admitting: Family Medicine

## 2019-09-08 NOTE — Telephone Encounter (Signed)
This pt hasn't been to see [place] since 2019 and appears to be nonadherent to her diabetes and htn medications. Can we call her to see if she is still a pt here and ask her to schedule an appt?

## 2019-09-09 NOTE — Telephone Encounter (Signed)
Please see refill message from 04/20/2019, pt is now going to another doctor due to her having the [place], I removed you as PCP on that date.Brandy Lynn, CMA

## 2019-12-24 ENCOUNTER — Other Ambulatory Visit: Payer: Self-pay | Admitting: Family Medicine

## 2019-12-24 DIAGNOSIS — I10 Essential (primary) hypertension: Secondary | ICD-10-CM

## 2020-02-09 ENCOUNTER — Other Ambulatory Visit: Payer: Self-pay | Admitting: Family Medicine

## 2020-02-09 DIAGNOSIS — E119 Type 2 diabetes mellitus without complications: Secondary | ICD-10-CM

## 2020-02-14 ENCOUNTER — Other Ambulatory Visit: Payer: Self-pay | Admitting: Family Medicine

## 2020-02-14 DIAGNOSIS — E119 Type 2 diabetes mellitus without complications: Secondary | ICD-10-CM

## 2020-09-02 ENCOUNTER — Other Ambulatory Visit: Payer: Self-pay

## 2020-09-02 ENCOUNTER — Encounter (HOSPITAL_BASED_OUTPATIENT_CLINIC_OR_DEPARTMENT_OTHER): Payer: Self-pay | Admitting: Emergency Medicine

## 2020-09-02 ENCOUNTER — Emergency Department (HOSPITAL_BASED_OUTPATIENT_CLINIC_OR_DEPARTMENT_OTHER)
Admission: EM | Admit: 2020-09-02 | Discharge: 2020-09-02 | Disposition: A | Discharge status: Home / Self Care | Payer: BLUE CROSS/BLUE SHIELD | Attending: Emergency Medicine | Admitting: Emergency Medicine

## 2020-09-02 DIAGNOSIS — U071 COVID-19: Secondary | ICD-10-CM | POA: Insufficient documentation

## 2020-09-02 DIAGNOSIS — E119 Type 2 diabetes mellitus without complications: Secondary | ICD-10-CM | POA: Insufficient documentation

## 2020-09-02 DIAGNOSIS — Z79899 Other long term (current) drug therapy: Secondary | ICD-10-CM | POA: Insufficient documentation

## 2020-09-02 DIAGNOSIS — Z7984 Long term (current) use of oral hypoglycemic drugs: Secondary | ICD-10-CM | POA: Insufficient documentation

## 2020-09-02 DIAGNOSIS — Z20822 Contact with and (suspected) exposure to covid-19: Secondary | ICD-10-CM

## 2020-09-02 DIAGNOSIS — M791 Myalgia, unspecified site: Secondary | ICD-10-CM | POA: Diagnosis not present

## 2020-09-02 DIAGNOSIS — I10 Essential (primary) hypertension: Secondary | ICD-10-CM | POA: Insufficient documentation

## 2020-09-02 DIAGNOSIS — Z7982 Long term (current) use of aspirin: Secondary | ICD-10-CM | POA: Diagnosis not present

## 2020-09-02 DIAGNOSIS — R112 Nausea with vomiting, unspecified: Secondary | ICD-10-CM | POA: Diagnosis not present

## 2020-09-02 DIAGNOSIS — R519 Headache, unspecified: Secondary | ICD-10-CM | POA: Insufficient documentation

## 2020-09-02 DIAGNOSIS — R509 Fever, unspecified: Secondary | ICD-10-CM | POA: Insufficient documentation

## 2020-09-02 LAB — URINALYSIS, ROUTINE W REFLEX MICROSCOPIC
Bilirubin Urine: NEGATIVE
Glucose, UA: NEGATIVE mg/dL
Hgb urine dipstick: NEGATIVE
Ketones, ur: NEGATIVE mg/dL
Leukocytes,Ua: NEGATIVE
Nitrite: NEGATIVE
Protein, ur: NEGATIVE mg/dL
Specific Gravity, Urine: 1.005 (ref 1.005–1.030)
pH: 7 (ref 5.0–8.0)

## 2020-09-02 LAB — CBG MONITORING, ED: Glucose-Capillary: 171 mg/dL — ABNORMAL HIGH (ref 70–99)

## 2020-09-02 MED ORDER — ONDANSETRON 4 MG PO TBDP
8.0000 mg | ORAL_TABLET | Freq: Once | ORAL | Status: AC
  Administered 2020-09-02: 8 mg via ORAL
  Filled 2020-09-02: qty 2

## 2020-09-02 MED ORDER — ONDANSETRON 8 MG PO TBDP: 8.0000 mg | ORAL_TABLET | Freq: Three times a day (TID) | ORAL | 0 refills | Status: AC | PRN

## 2020-09-02 MED ORDER — ACETAMINOPHEN 325 MG PO TABS
650.0000 mg | ORAL_TABLET | Freq: Once | ORAL | Status: AC | PRN
Start: 2020-09-02 — End: 2020-09-02
  Administered 2020-09-02: 650 mg via ORAL
  Filled 2020-09-02: qty 2

## 2020-09-02 NOTE — ED Triage Notes (Signed)
Reports body aches, headache, nausea, chills, and currently has a fever for the last two days.  Reports she has had covid vaccine.  Was going to get booster next week.

## 2020-09-02 NOTE — ED Provider Notes (Addendum)
[facility] Provider Note: Georgena Spurling, MD, FACEP  CSN: [id number] MRN: [id number] ARRIVAL: 09/02/20 at [place]: [place]  Generalized Body Aches   HISTORY OF PRESENT ILLNESS  09/02/20 1:49 AM Brandy Lynn is a 55 y.o. female with a 2-day history of body pain, headache, nausea with 3 episodes of vomiting, chills and fever.  Her fever has been as high as 101 for which she was given Tylenol in triage.  She rates her generalized pain as a 9 out of 10, aching in nature.  She has had 2 COVID vaccines but no booster.  She denies nasal congestion, sore throat, cough, shortness of breath, abdominal pain or diarrhea.   Past Medical History:  Diagnosis Date  . Hemoptysis, unspecified 12/2012, 12/2013  . HTN (hypertension) 11/12/2012  . Hyperlipidemia   . Obesity 12/09/2010  . 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 NSAID and low-protein diet. Uric acid and CBC today.    . Pyelonephritis    hx/notes 06/26/2015  . Sepsis ([place]) hospitalized 06/26/2015  . Sickle cell trait ([place])   . Type II diabetes mellitus ([place])     Past Surgical History:  Procedure Laterality Date  . TUBAL LIGATION  2006    Family History  Problem Relation Age of Onset  . Diabetes Father   . Diabetes Sister   . Diabetes Brother   . Breast cancer Sister        breast cancer in her 22's and passed away about 66. Not sure if she passed away from [facility]  . Leukemia Brother     Social History   Tobacco Use  . Smoking status: Never Smoker  . Smokeless tobacco: Never Used  Substance Use Topics  . Alcohol use: No  . Drug use: No    Prior to Admission medications   Medication Sig Start Date End Date Taking? Authorizing Provider  ondansetron (ZOFRAN ODT) 8 MG disintegrating tablet Take 1 tablet (8 mg total) by mouth every 8 (eight) hours as needed for nausea or vomiting. 09/02/20  Yes Tatianna Ibbotson,  MD  aspirin EC 81 MG tablet Take 81 mg by mouth daily.    [provider]  atorvastatin (LIPITOR) 40 MG tablet Take 1 tablet (40 mg total) by mouth daily. 08/20/18   Benay Pike, MD  fluticasone [facility]) 50 MCG/ACT nasal spray Place 2 sprays into both nostrils daily. 03/08/18   Long, Wonda Olds, MD  glipiZIDE (GLUCOTROL) 10 MG tablet Take 1 tablet (10 mg total) by mouth 2 (two) times daily before a meal. 07/05/18   Benay Pike, MD  lisinopril (PRINIVIL,ZESTRIL) 20 MG tablet Take 1 tablet (20 mg total) by mouth daily. 08/13/18   Benay Pike, MD  metFORMIN (GLUCOPHAGE) 1000 MG tablet TAKE 1 TABLET BY MOUTH TWICE DAILY WITH A MEAL 06/29/19   Benay Pike, MD  Vitamin D, Ergocalciferol, (DRISDOL) 50000 UNITS CAPS capsule Take 50,000 Units by mouth every 7 (seven) days.    [provider]  omeprazole (PRILOSEC) 20 MG capsule Take 1 capsule (20 mg total) by mouth daily. 03/22/18 09/02/20  Benay Pike, MD  ranitidine (ZANTAC) 150 MG capsule Take 1 capsule (150 mg total) by mouth 2 (two) times daily. 12/14/17 09/02/20  Mercy Riding, MD    Allergies Other and Shellfish allergy   REVIEW OF SYSTEMS  Negative except as noted here or in the  History of Present Illness.   PHYSICAL EXAMINATION  Initial Vital Signs Blood pressure (!) 157/75, pulse (!) 132, temperature (!) 101 F (38.3 C), temperature source Oral, resp. rate 20, height 5\' 5"  (1.651 m), weight 85.1 kg, SpO2 100 %.  Examination General: Well-developed, well-nourished female in no acute distress; appearance consistent with age of record HENT: normocephalic; atraumatic Eyes: pupils equal, round and reactive to light; extraocular muscles intact Neck: supple Heart: regular rate and rhythm Lungs: clear to auscultation bilaterally Abdomen: soft; nondistended; nontender; bowel sounds present Extremities: No deformity; full range of motion; pulses normal Neurologic: Awake, alert and oriented; motor function intact  in all extremities and symmetric; no facial droop Skin: Warm and dry Psychiatric: Normal mood and affect   RESULTS  Summary of this visit's results, reviewed and interpreted by myself:   EKG Interpretation  Date/Time:    Ventricular Rate:    PR Interval:    QRS Duration:   QT Interval:    QTC Calculation:   R Axis:     Text Interpretation:        Laboratory Studies: Results for orders placed or performed during the hospital encounter of 09/02/20 (from the past 24 hour(s))  CBG monitoring, ED     Status: Abnormal   Collection Time: 09/02/20  2:19 AM  Result Value Ref Range   Glucose-Capillary 171 (H) 70 - 99 mg/dL  Urinalysis, Routine w reflex microscopic     Status: None   Collection Time: 09/02/20  2:22 AM  Result Value Ref Range   Color, Urine YELLOW YELLOW   APPearance CLEAR CLEAR   Specific Gravity, Urine 1.005 1.005 - 1.030   pH 7.0 5.0 - 8.0   Glucose, UA NEGATIVE NEGATIVE mg/dL   Hgb urine dipstick NEGATIVE NEGATIVE   Bilirubin Urine NEGATIVE NEGATIVE   Ketones, ur NEGATIVE NEGATIVE mg/dL   Protein, ur NEGATIVE NEGATIVE mg/dL   Nitrite NEGATIVE NEGATIVE   Leukocytes,Ua NEGATIVE NEGATIVE   Imaging Studies: No results found.  ED COURSE and MDM  Nursing notes, initial and subsequent vitals signs, including pulse oximetry, reviewed and interpreted by myself.  Vitals:   09/02/20 0016 09/02/20 0017 09/02/20 0300 09/02/20 0303  BP: (!) 157/75  135/68   Pulse: (!) 132   95  Resp: 20  19   Temp: (!) 101 F (38.3 C)  98.6 F (37 C)   TempSrc: Oral     SpO2: 100%   97%  Weight:  85.1 kg    Height:  5\' 5"  (1.651 m)     Medications  acetaminophen (TYLENOL) tablet 650 mg (650 mg Oral Given 09/02/20 0022)  ondansetron (ZOFRAN-ODT) disintegrating tablet 8 mg (8 mg Oral Given 09/02/20 0220)   2:57 AM Patient's presentation is consistent with a viral illness, likely COVID-19 as its presentation can be quite protean.  COVID test pending.  PROCEDURES   Procedures   ED DIAGNOSES     ICD-10-CM   1. Suspected COVID-19 virus infection  Z20.822         Onofrio Klemp, Jenny Reichmann, MD 09/02/20 0235    Shanon Rosser, MD 09/02/20 [id number]

## 2020-09-03 LAB — SARS CORONAVIRUS 2 (TAT 6-24 HRS): SARS Coronavirus 2: POSITIVE — AB

## 2020-09-04 ENCOUNTER — Telehealth: Payer: Self-pay | Admitting: Adult Health

## 2020-09-04 NOTE — Telephone Encounter (Signed)
Called to discuss with patient about COVID-19 symptoms and the use of one of the available treatments for those with mild to moderate Covid symptoms and at a high risk of hospitalization.  Pt appears to qualify for outpatient treatment due to co-morbid conditions and/or a member of an at-risk group in accordance with the FDA Emergency Use Authorization.    Called and patient shouted wrong number, and hung up.  Scot Dock

## 2021-09-17 ENCOUNTER — Ambulatory Visit (INDEPENDENT_AMBULATORY_CARE_PROVIDER_SITE_OTHER): Payer: 59 | Admitting: Family Medicine

## 2021-09-17 ENCOUNTER — Encounter: Payer: Self-pay | Admitting: Family Medicine

## 2021-09-17 VITALS — BP 137/62 | HR 100 | Temp 98.2°F | Ht 65.0 in | Wt 163.0 lb

## 2021-09-17 DIAGNOSIS — Z7689 Persons encountering health services in other specified circumstances: Secondary | ICD-10-CM | POA: Diagnosis not present

## 2021-09-17 DIAGNOSIS — J0140 Acute pansinusitis, unspecified: Secondary | ICD-10-CM

## 2021-09-17 DIAGNOSIS — C8213 Follicular lymphoma grade II, intra-abdominal lymph nodes: Secondary | ICD-10-CM

## 2021-09-17 DIAGNOSIS — E119 Type 2 diabetes mellitus without complications: Secondary | ICD-10-CM

## 2021-09-17 DIAGNOSIS — D509 Iron deficiency anemia, unspecified: Secondary | ICD-10-CM

## 2021-09-17 DIAGNOSIS — I10 Essential (primary) hypertension: Secondary | ICD-10-CM

## 2021-09-17 LAB — COMPREHENSIVE METABOLIC PANEL
ALT: 36 U/L — ABNORMAL HIGH (ref 0–35)
AST: 24 U/L (ref 0–37)
Albumin: 4.4 g/dL (ref 3.5–5.2)
Alkaline Phosphatase: 76 U/L (ref 39–117)
BUN: 12 mg/dL (ref 6–23)
CO2: 32 mEq/L (ref 19–32)
Calcium: 9.6 mg/dL (ref 8.4–10.5)
Chloride: 103 mEq/L (ref 96–112)
Creatinine, Ser: 1.26 mg/dL — ABNORMAL HIGH (ref 0.40–1.20)
GFR: 48.04 mL/min — ABNORMAL LOW (ref >60.00)
Glucose, Bld: 210 mg/dL — ABNORMAL HIGH (ref 70–99)
Potassium: 4.2 mEq/L (ref 3.5–5.1)
Sodium: 141 mEq/L (ref 135–145)
Total Bilirubin: 0.5 mg/dL (ref 0.2–1.2)
Total Protein: 6.5 g/dL (ref 6.0–8.3)

## 2021-09-17 LAB — IBC PANEL
Iron: 121 ug/dL (ref 42–145)
Saturation Ratios: 37.1 % (ref 20.0–50.0)
TIBC: 326.2 ug/dL (ref 250.0–450.0)
Transferrin: 233 mg/dL (ref 212.0–360.0)

## 2021-09-17 LAB — LIPID PANEL
Cholesterol: 123 mg/dL (ref 0–200)
HDL: 50 mg/dL (ref >39.00)
LDL Cholesterol: 61 mg/dL (ref 0–99)
NonHDL: 73.4
Total CHOL/HDL Ratio: 2
Triglycerides: 64 mg/dL (ref 0.0–149.0)
VLDL: 12.8 mg/dL (ref 0.0–40.0)

## 2021-09-17 LAB — CBC
HCT: 31.1 % — ABNORMAL LOW (ref 36.0–46.0)
Hemoglobin: 10.5 g/dL — ABNORMAL LOW (ref 12.0–15.0)
MCHC: 33.8 g/dL (ref 30.0–36.0)
MCV: 89.1 fl (ref 78.0–100.0)
Platelets: 203 10*3/uL (ref 150.0–400.0)
RBC: 3.49 Mil/uL — ABNORMAL LOW (ref 3.87–5.11)
RDW: 13.5 % (ref 11.5–15.5)
WBC: 3.6 10*3/uL — ABNORMAL LOW (ref 4.0–10.5)

## 2021-09-17 LAB — HEMOGLOBIN A1C: Hgb A1c MFr Bld: 7.3 % — ABNORMAL HIGH (ref 4.6–6.5)

## 2021-09-17 LAB — TSH: TSH: 0.28 u[IU]/mL — ABNORMAL LOW (ref 0.35–5.50)

## 2021-09-17 MED ORDER — METFORMIN HCL 1000 MG PO TABS: 1000.0000 mg | ORAL_TABLET | Freq: Two times a day (BID) | ORAL | 1 refills | Status: DC

## 2021-09-17 MED ORDER — AMOXICILLIN-POT CLAVULANATE 875-125 MG PO TABS: 1.0000 | ORAL_TABLET | Freq: Two times a day (BID) | ORAL | 0 refills | Status: AC

## 2021-09-17 NOTE — Patient Instructions (Signed)
Thank you for choosing [facility] at [facility] for your Primary Care needs. I am excited for the opportunity to partner with you to meet your health care goals. It was a pleasure meeting you today! ° ° °Information on diet, exercise, and health maintenance recommendations are listed below. This is information to help you be sure you are on track for optimal health and monitoring.  ° °Please look over this and let us know if you have any questions or if you have completed any of the health maintenance outside of  so that we can be sure your records are up to date.  °___________________________________________________________ ° °MyChart:  °For all urgent or time sensitive needs we ask that you please call the office to avoid delays. Our number is [phone]. °MyChart is not constantly monitored and due to the large volume of messages a day, replies may take up to 72 business hours. ° °MyChart Policy: °MyChart allows for you to see your visit notes, after visit summary, provider recommendations, lab and tests results, make an appointment, request refills, and contact your provider or the office for non-urgent questions or concerns. Providers are seeing patients during normal business hours and do not have built in time to review MyChart messages.  °We ask that you allow a minimum of 3 business days for responses to MyChart messages. For this reason, please do not send urgent requests through MyChart. Please call the office at [phone]. °New and ongoing conditions may require a visit. We have virtual and in-person visits available for your convenience.  °Complex MyChart concerns may require a visit. Your provider may request you schedule a virtual or in-person visit to ensure we are providing the best care possible. °MyChart messages sent after 11:00 AM on Friday will not be received by the provider until Monday morning.  °  °Lab and Test Results: °You will receive your lab and  test results on MyChart as soon as they are completed and results have been sent by the lab or testing facility. Due to this service, you will receive your results BEFORE your provider.  °I review lab and test results each morning prior to seeing patients. Some results require collaboration with other providers to ensure you are receiving the most appropriate care. For this reason, we ask that you please allow a minimum of 3-5 business days from the time that ALL results have been received for your provider to receive and review lab and test results and contact you about these.  °Most lab and test result comments from the provider will be sent through MyChart. Your provider may recommend changes to the plan of care, follow-up visits, repeat testing, ask questions, or request an office visit to discuss these results. You may reply directly to this message or call the office to provide information for the provider or set up an appointment. °In some instances, you will be called with test results and recommendations. Please let us know if this is preferred and we will make note of this in your chart to provide this for you.    °If you have not heard a response to your lab or test results in 5 business days from all results returning to MyChart, please call the office to let us know. We ask that you please avoid calling prior to this time unless there is an emergent concern. Due to high call volumes, this can delay the resulting process. ° °After Hours: °For all non-emergency after hours needs,   please call the office at [phone] and select the option to reach the on-call  service. On-call services are shared between multiple [facility] offices and therefore it will not be possible to speak directly with your provider. On-call providers may provide medical advice and recommendations, but are unable to provide refills for maintenance medications.  °For all emergency or urgent medical needs after normal business  hours, we recommend that you seek care at the closest Urgent Care or Emergency Department to ensure appropriate treatment in a timely manner.  °[facility] has a 24 hour emergency room located on the ground floor for your convenience.  ° °Urgent Concerns During the Business Day °Providers are seeing patients from 8AM to 5PM with a busy schedule and are most often not able to respond to non-urgent calls until the end of the day or the next business day. °If you should have URGENT concerns during the day, please call and speak to the nurse or schedule a same day appointment so that we can address your concern without delay.  ° °Thank you, again, for choosing me as your health care partner. I appreciate your trust and look forward to learning more about you.  ° °Rosine Solecki B. Mansi Tokar, DNP, FNP-C ° °___________________________________________________________ ° °Health Maintenance Recommendations °Screening Testing °Mammogram °Every 1-2 years based on history and risk factors °Starting at age 50 °Pap Smear °Ages 21-39 every 3 years °Ages 30-65 every 5 years with HPV testing °More frequent testing may be required based on results and history °Colon Cancer Screening °Every 1-10 years based on test performed, risk factors, and history °Starting at age 45 °Bone Density Screening °Every 2-10 years based on history °Starting at age 65 for women °Recommendations for men differ based on medication usage, history, and risk factors °AAA Screening °One time ultrasound °Men 65-75 years old who have ever smoked °Lung Cancer Screening °Low Dose Lung CT every 12 months °Age 50-80 years with a 20 pack-year smoking history who still smoke or who have quit within the last 15 years ° °Screening Labs °Routine  Labs: Complete Blood Count (CBC), Complete Metabolic Panel (CMP), Cholesterol (Lipid Panel) °Every 6-12 months based on history and medications °May be recommended more frequently based on current conditions or  previous results °Hemoglobin A1c Lab °Every 3-12 months based on history and previous results °Starting at age 45 or earlier with diagnosis of diabetes, high cholesterol, BMI >26, and/or risk factors °Frequent monitoring for patients with diabetes to ensure blood sugar control °Thyroid Panel (TSH w/ T3 & T4) °Every 6 months based on history, symptoms, and risk factors °May be repeated more often if on medication °HIV °One time testing for all patients 13 and older °May be repeated more frequently for patients with increased risk factors or exposure °Hepatitis C °One time testing for all patients 18 and older °May be repeated more frequently for patients with increased risk factors or exposure °Gonorrhea, Chlamydia °Every 12 months for all sexually active persons 13-24 years °Additional monitoring may be recommended for those who are considered high risk or who have symptoms °PSA °Men 40-54 years old with risk factors °Additional screening may be recommended from age 55-69 based on risk factors, symptoms, and history ° °Vaccine Recommendations °Tetanus Booster °All adults every 10 years °Flu Vaccine °All patients 6 months and older every year °COVID Vaccine °All patients 12 years and older °Initial dosing with booster °May recommend additional booster based on age and health history °HPV Vaccine °2 doses all patients   age 9-26 °Dosing may be considered for patients over 26 °Shingles Vaccine (Shingrix) °2 doses all adults 50 years and older °Pneumonia (Pneumovax 23) °All adults 65 years and older °May recommend earlier dosing based on health history °Pneumonia (Prevnar 13) °All adults 65 years and older °Dosed 1 year after Pneumovax 23 °Pneumonia (Prevnar 20) °All adults 65 years and older (adults 19-64 with certain conditions or risk factors) °1 dose  °For those who have no received Prevnar 13 vaccine previously ° ° °Additional Screening, Testing, and Vaccinations may be recommended on an individualized basis based on  family history, health history, risk factors, and/or exposure.  °__________________________________________________________ ° °Diet Recommendations for All Patients ° °I recommend that all patients maintain a diet low in saturated fats, carbohydrates, and cholesterol. While this can be challenging at first, it is not impossible and small changes can make big differences.  °Things to try: °Decreasing the amount of soda, sweet tea, and/or juice to one or less per day and replace with water °While water is always the first choice, if you do not like water you may consider °adding a water additive without sugar to improve the taste °other sugar free drinks °Replace potatoes with a brightly colored vegetable at dinner °Use healthy oils, such as canola oil or olive oil, instead of butter or hard margarine °Limit your bread intake to two pieces or less a day °Replace regular pasta with low carb pasta options °Bake, broil, or grill foods instead of frying °Monitor portion sizes  °Eat smaller, more frequent meals throughout the day instead of large meals ° °An important thing to remember is, if you love foods that are not great for your health, you don't have to give them up completely. Instead, allow these foods to be a reward when you have done well. Allowing yourself to still have special treats every once in a while is a nice way to tell yourself thank you for working hard to keep yourself healthy.  ° °Also remember that every day is a new day. If you have a bad day and "fall off the wagon", you can still climb right back up and keep moving along on your journey! ° °We have resources available to help you!  °Some websites that may be helpful include: °www.MyPlate.gov  °Www.VeryWellFit.com °_____________________________________________________________ ° °Activity Recommendations for All Patients ° °I recommend that all adults get at least 20 minutes of moderate physical activity that elevates your heart rate at least 5  days out of the week.  °Some examples include: °Walking or jogging at a pace that allows you to carry on a conversation °Cycling (stationary bike or outdoors) °Water aerobics °Yoga °Weight lifting °Dancing °If physical limitations prevent you from putting stress on your joints, exercise in a pool or seated in a chair are excellent options. ° °Do determine your MAXIMUM heart rate for activity: YOUR AGE - 220 = MAX HeartRate  ° °Remember! °Do not push yourself too hard.  °Start slowly and build up your pace, speed, weight, time in exercise, etc.  °Allow your body to rest between exercise and get good sleep. °You will need more water than normal when you are exerting yourself. Do not wait until you are thirsty to drink. Drink with a purpose of getting in at least 8, 8 ounce glasses of water a day plus more depending on how much you exercise and sweat.  ° ° °If you begin to develop dizziness, chest pain, abdominal pain, jaw pain, shortness of breath, headache, vision   changes, lightheadedness, or other concerning symptoms, stop the activity and allow your body to rest. If your symptoms are severe, seek emergency evaluation immediately. If your symptoms are concerning, but not severe, please let us know so that we can recommend further evaluation.  ° ° ° °
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
## 2021-09-17 NOTE — Progress Notes (Signed)
CBC/CMP are stable compared to when you had them checked last October. Iron levels are good - I still want you to follow-up with hematology regarding anemia.  Cholesterol is good A1c is continuing to slowly come down compared to last labs in October. Keep up the good work - continue current medicines and focus on avoiding sugar and carb intake and try to get regular exercise. We will recheck this at your 44-month follow-up and make changes if not continuing to improve.   Thyroid level is off - please add Free T4 level to labs - we will update her with results and if we need to change anything else

## 2021-09-18 ENCOUNTER — Other Ambulatory Visit (INDEPENDENT_AMBULATORY_CARE_PROVIDER_SITE_OTHER): Payer: 59

## 2021-09-18 DIAGNOSIS — R7989 Other specified abnormal findings of blood chemistry: Secondary | ICD-10-CM | POA: Diagnosis not present

## 2021-09-18 LAB — T4, FREE: Free T4: 0.91 ng/dL (ref 0.60–1.60)

## 2021-09-20 ENCOUNTER — Other Ambulatory Visit: Payer: Self-pay | Admitting: Family

## 2021-09-20 DIAGNOSIS — D649 Anemia, unspecified: Secondary | ICD-10-CM

## 2021-09-23 ENCOUNTER — Encounter: Payer: Self-pay | Admitting: Family

## 2021-09-23 ENCOUNTER — Telehealth: Payer: Self-pay | Admitting: Family Medicine

## 2021-09-23 ENCOUNTER — Other Ambulatory Visit: Payer: Self-pay

## 2021-09-23 ENCOUNTER — Inpatient Hospital Stay: Payer: 59 | Attending: Hematology & Oncology

## 2021-09-23 ENCOUNTER — Inpatient Hospital Stay (HOSPITAL_BASED_OUTPATIENT_CLINIC_OR_DEPARTMENT_OTHER): Payer: 59 | Admitting: Family

## 2021-09-23 ENCOUNTER — Other Ambulatory Visit: Payer: Self-pay | Admitting: Family

## 2021-09-23 VITALS — BP 135/64 | HR 89 | Temp 97.9°F | Resp 18 | Ht 65.0 in | Wt 164.1 lb

## 2021-09-23 DIAGNOSIS — E119 Type 2 diabetes mellitus without complications: Secondary | ICD-10-CM | POA: Diagnosis not present

## 2021-09-23 DIAGNOSIS — D509 Iron deficiency anemia, unspecified: Secondary | ICD-10-CM | POA: Diagnosis present

## 2021-09-23 DIAGNOSIS — Z803 Family history of malignant neoplasm of breast: Secondary | ICD-10-CM | POA: Insufficient documentation

## 2021-09-23 DIAGNOSIS — Z7982 Long term (current) use of aspirin: Secondary | ICD-10-CM | POA: Diagnosis not present

## 2021-09-23 DIAGNOSIS — Z8572 Personal history of non-Hodgkin lymphomas: Secondary | ICD-10-CM | POA: Insufficient documentation

## 2021-09-23 DIAGNOSIS — C8213 Follicular lymphoma grade II, intra-abdominal lymph nodes: Secondary | ICD-10-CM

## 2021-09-23 DIAGNOSIS — E785 Hyperlipidemia, unspecified: Secondary | ICD-10-CM | POA: Insufficient documentation

## 2021-09-23 DIAGNOSIS — Z7984 Long term (current) use of oral hypoglycemic drugs: Secondary | ICD-10-CM | POA: Insufficient documentation

## 2021-09-23 DIAGNOSIS — Z806 Family history of leukemia: Secondary | ICD-10-CM | POA: Diagnosis not present

## 2021-09-23 DIAGNOSIS — E669 Obesity, unspecified: Secondary | ICD-10-CM | POA: Insufficient documentation

## 2021-09-23 DIAGNOSIS — I10 Essential (primary) hypertension: Secondary | ICD-10-CM | POA: Insufficient documentation

## 2021-09-23 DIAGNOSIS — Z6827 Body mass index (BMI) 27.0-27.9, adult: Secondary | ICD-10-CM | POA: Insufficient documentation

## 2021-09-23 DIAGNOSIS — Z7985 Long-term (current) use of injectable non-insulin antidiabetic drugs: Secondary | ICD-10-CM | POA: Diagnosis not present

## 2021-09-23 DIAGNOSIS — Z79620 Long term (current) use of immunosuppressive biologic: Secondary | ICD-10-CM | POA: Diagnosis not present

## 2021-09-23 DIAGNOSIS — Z79899 Other long term (current) drug therapy: Secondary | ICD-10-CM | POA: Diagnosis not present

## 2021-09-23 DIAGNOSIS — R5383 Other fatigue: Secondary | ICD-10-CM | POA: Insufficient documentation

## 2021-09-23 DIAGNOSIS — Z9221 Personal history of antineoplastic chemotherapy: Secondary | ICD-10-CM | POA: Insufficient documentation

## 2021-09-23 DIAGNOSIS — D649 Anemia, unspecified: Secondary | ICD-10-CM

## 2021-09-23 DIAGNOSIS — D573 Sickle-cell trait: Secondary | ICD-10-CM | POA: Insufficient documentation

## 2021-09-23 DIAGNOSIS — B379 Candidiasis, unspecified: Secondary | ICD-10-CM

## 2021-09-23 LAB — CMP (CANCER CENTER ONLY)
ALT: 15 U/L (ref 0–44)
AST: 12 U/L — ABNORMAL LOW (ref 15–41)
Albumin: 4.3 g/dL (ref 3.5–5.0)
Alkaline Phosphatase: 68 U/L (ref 38–126)
Anion gap: 9 (ref 5–15)
BUN: 15 mg/dL (ref 6–20)
CO2: 28 mmol/L (ref 22–32)
Calcium: 9.3 mg/dL (ref 8.9–10.3)
Chloride: 101 mmol/L (ref 98–111)
Creatinine: 1.16 mg/dL — ABNORMAL HIGH (ref 0.44–1.00)
GFR, Estimated: 56 mL/min — ABNORMAL LOW (ref >60)
Glucose, Bld: 244 mg/dL — ABNORMAL HIGH (ref 70–99)
Potassium: 4.2 mmol/L (ref 3.5–5.1)
Sodium: 138 mmol/L (ref 135–145)
Total Bilirubin: 0.5 mg/dL (ref 0.3–1.2)
Total Protein: 6.5 g/dL (ref 6.5–8.1)

## 2021-09-23 LAB — CBC WITH DIFFERENTIAL (CANCER CENTER ONLY)
Abs Immature Granulocytes: 0.08 10*3/uL — ABNORMAL HIGH (ref 0.00–0.07)
Basophils Absolute: 0 10*3/uL (ref 0.0–0.1)
Basophils Relative: 1 %
Eosinophils Absolute: 0.8 10*3/uL — ABNORMAL HIGH (ref 0.0–0.5)
Eosinophils Relative: 14 %
HCT: 31.4 % — ABNORMAL LOW (ref 36.0–46.0)
Hemoglobin: 10.6 g/dL — ABNORMAL LOW (ref 12.0–15.0)
Immature Granulocytes: 2 %
Lymphocytes Relative: 26 %
Lymphs Abs: 1.4 10*3/uL (ref 0.7–4.0)
MCH: 30.2 pg (ref 26.0–34.0)
MCHC: 33.8 g/dL (ref 30.0–36.0)
MCV: 89.5 fL (ref 80.0–100.0)
Monocytes Absolute: 0.4 10*3/uL (ref 0.1–1.0)
Monocytes Relative: 8 %
Neutro Abs: 2.7 10*3/uL (ref 1.7–7.7)
Neutrophils Relative %: 49 %
Platelet Count: 216 10*3/uL (ref 150–400)
RBC: 3.51 MIL/uL — ABNORMAL LOW (ref 3.87–5.11)
RDW: 12.6 % (ref 11.5–15.5)
WBC Count: 5.4 10*3/uL (ref 4.0–10.5)
nRBC: 0 % (ref 0.0–0.2)

## 2021-09-23 LAB — RETICULOCYTES
Immature Retic Fract: 13.8 % (ref 2.3–15.9)
RBC.: 3.53 MIL/uL — ABNORMAL LOW (ref 3.87–5.11)
Retic Count, Absolute: 85.4 10*3/uL (ref 19.0–186.0)
Retic Ct Pct: 2.4 % (ref 0.4–3.1)

## 2021-09-23 LAB — SAMPLE TO BLOOD BANK

## 2021-09-23 LAB — LACTATE DEHYDROGENASE: LDH: 137 U/L (ref 98–192)

## 2021-09-23 LAB — IRON AND IRON BINDING CAPACITY (CC-WL,HP ONLY)
Iron: 84 ug/dL (ref 28–170)
Saturation Ratios: 25 % (ref 10.4–31.8)
TIBC: 337 ug/dL (ref 250–450)
UIBC: 253 ug/dL (ref 148–442)

## 2021-09-23 LAB — SAVE SMEAR(SSMR), FOR PROVIDER SLIDE REVIEW

## 2021-09-23 MED ORDER — FOLIC ACID 1 MG PO TABS: 1.0000 mg | ORAL_TABLET | Freq: Every day | ORAL | 11 refills | Status: AC

## 2021-09-23 MED ORDER — FLUCONAZOLE 150 MG PO TABS: 150.0000 mg | ORAL_TABLET | Freq: Once | ORAL | 1 refills | Status: AC

## 2021-09-23 NOTE — Telephone Encounter (Signed)
Pt notified of rx. 

## 2021-09-23 NOTE — Progress Notes (Signed)
[facility] Note  09/24/2021     CHIEF COMPLAINT Patient presents for Diabetic Eye Exam   HISTORY OF PRESENT ILLNESS: Brandy Lynn is a 56 y.o. female who presents to the clinic today for:   HPI     Diabetic Eye Exam   Diabetes characteristics include Type 2.  This started 15 years ago.  Blood sugar level fluctuates.  Last A1C 7.  I, the attending physician,  performed the HPI with the patient and updated documentation appropriately.        Comments   Patient had gestational diabetes with her second child in 2002.  It went away then came back in 2006 with her next child.  She has had it continuous since then. She does not check her BS's.       Last edited by Bernarda Caffey, MD on 09/25/2021  1:01 PM.    Pt is here on the referral of her PCP, Caleen Jobs, NP for DM exam, pts last A1c was 7.3 on 01.31.23, pt has been diabetic for 16 years, she is on metformin 1000mg  BID and Ozempic once a week, she states her eyes feel dry at times, she uses Systane, she has glasses, but she does not wear them very often bc they make the back of her head hurt, pt endorses having high BP  Referring physician: Terrilyn Saver, NP [address] [place],   [address]  HISTORICAL INFORMATION:   Selected notes from the MEDICAL RECORD NUMBER Referred by Caleen Jobs, NP for DM exam LEE:  Ocular Hx- PMH-    CURRENT MEDICATIONS: No current outpatient medications on file. (Ophthalmic Drugs)   No current facility-administered medications for this visit. (Ophthalmic Drugs)   Current Outpatient Medications (Other)  Medication Sig   amLODipine (NORVASC) 5 MG tablet Take 5 mg by mouth daily.   amoxicillin-clavulanate (AUGMENTIN) 875-125 MG tablet Take 1 tablet by mouth 2 (two) times daily for 10 days.   aspirin EC 81 MG tablet Take 81 mg by mouth daily.   atorvastatin (LIPITOR) 40 MG tablet Take 1 tablet (40 mg total) by mouth daily.   lisinopril  (PRINIVIL,ZESTRIL) 20 MG tablet Take 1 tablet (20 mg total) by mouth daily.   metFORMIN (GLUCOPHAGE) 1000 MG tablet Take 1 tablet (1,000 mg total) by mouth 2 (two) times daily with a meal.   ondansetron (ZOFRAN ODT) 8 MG disintegrating tablet Take 1 tablet (8 mg total) by mouth every 8 (eight) hours as needed for nausea or vomiting.   OZEMPIC, 0.25 OR 0.5 MG/DOSE, 2 MG/1.5ML SOPN Inject 0.5 mg into the skin once a week.   Vitamin D, Ergocalciferol, (DRISDOL) 50000 UNITS CAPS capsule Take 50,000 Units by mouth every 7 (seven) days.   folic acid (FOLVITE) 1 MG tablet Take 1 tablet (1 mg total) by mouth daily. (Patient not taking: Reported on 09/24/2021)   No current facility-administered medications for this visit. (Other)   REVIEW OF SYSTEMS: ROS   Positive for: Gastrointestinal, Endocrine, Eyes Negative for: Constitutional, Neurological, Skin, Genitourinary, Musculoskeletal, HENT, Cardiovascular, Respiratory, Psychiatric, Allergic/Imm, Heme/Lymph Last edited by Leonie Douglas, COA on 09/24/2021  1:32 PM.       ALLERGIES Allergies  Allergen Reactions   Allopurinol Other (See Comments)    Neutropenia   Ferumoxytol Other (See Comments)    ANGIOEDEMA   Shellfish Allergy Itching   Duloxetine Other (See Comments)    Constipation     PAST MEDICAL HISTORY Past Medical  History:  Diagnosis Date   Hemoptysis, unspecified 12/2012, 12/2013   HTN (hypertension) 11/12/2012   Hyperlipidemia    Obesity 12/09/2010   Podagra 09/24/2012   Right great toe MTP joint tenderness and redness, no skin breakdown, exam most consistent with podagra. NSAID and low-protein diet. Uric acid and CBC today.     Pyelonephritis    hx/notes 06/26/2015   Sepsis ([place]) hospitalized 06/26/2015   Sickle cell trait ([place])    Type II diabetes mellitus ([place])    Past Surgical History:  Procedure Laterality Date   TUBAL LIGATION  2006    FAMILY HISTORY Family History  Problem Relation Age of Onset   Diabetes Father     Diabetes Sister    Diabetes Brother    Breast cancer Sister        breast cancer in her 43's and passed away about 39. Not sure if she passed away from [place] History   Tobacco Use   Smoking status: Never   Smokeless tobacco: Never  Vaping Use   Vaping Use: Never used  Substance Use Topics   Alcohol use: No   Drug use: No         OPHTHALMIC EXAM:  Base Eye Exam     Visual Acuity (Snellen - Linear)       Right Left   Dist cc 20/30 20/25 +2   Dist ph cc NI NI    Correction: Glasses         Tonometry (Tonopen, 1:45 PM)       Right Left   Pressure 23 22         Pupils       Dark Light Shape React APD   Right 3 2 Round Brisk None   Left 3 2 Round Brisk None         Visual Fields (Counting fingers)       Left Right    Full Full         Extraocular Movement       Right Left    Full Full         Neuro/Psych     Oriented x3: Yes   Mood/Affect: Normal         Dilation     Both eyes: 1.0% Mydriacyl, 2.5% Phenylephrine @ 1:45 PM           Slit Lamp and Fundus Exam     Slit Lamp Exam       Right Left   Lids/Lashes Dermatochalasis - upper lid Dermatochalasis - upper lid   Conjunctiva/Sclera mild melanosis, nasal pingeucula mild melanosis, nasal pingeucula   Cornea mild arcus, focal patch of haze with mild NV at 0430 mild arcus   Anterior Chamber Deep and quiet Deep and quiet   Iris Round and dilated, No NVI Round and dilated, No NVI   Lens 2+ Nuclear sclerosis, 2+ Cortical cataract 2+ Nuclear sclerosis, 2+ Cortical cataract   Anterior Vitreous Mild Vitreous syneresis Mild Vitreous syneresis         Fundus Exam       Right Left   Disc Pink and Sharp, Compact Pink and Sharp, Compact   C/D Ratio 0.5 0.4   Macula Flat, Good foveal reflex, +MA, punctate drusen / RPE mottling Flat, Good foveal reflex, RPE mottling, rare MA   Vessels attenuated, mild Copper wiring attenuated, mild Copper  wiring   Periphery Attached, rare, scattered MA  Attached, rare, scattered MA           Refraction     Wearing Rx       Sphere Cylinder Axis Add   Right -1.00 +0.75 145 +2.00   Left -1.00 +1.25 028 +2.00         Manifest Refraction       Sphere Cylinder Axis Dist VA   Right -1.25 Sphere  20/25+2   Left -1.00 +1.00 028 20/20-2            IMAGING AND PROCEDURES  Imaging and Procedures for 09/24/2021  OCT, Retina - OU - Both Eyes       Right Eye Quality was good. Central Foveal Thickness: 257. Progression has no prior data. Findings include normal foveal contour, no IRF, no SRF, vitreomacular adhesion (Trace focal cystic nasal fovea).   Left Eye Quality was good. Central Foveal Thickness: 243. Progression has no prior data. Findings include normal foveal contour, no IRF, no SRF, vitreomacular adhesion .   Notes *Images captured and stored on drive  Diagnosis / Impression:  NFP; no IRF/SRF No DME OU  Clinical management:  See below  Abbreviations: NFP - Normal foveal profile. CME - cystoid macular edema. PED - pigment epithelial detachment. IRF - intraretinal fluid. SRF - subretinal fluid. EZ - ellipsoid zone. ERM - epiretinal membrane. ORA - outer retinal atrophy. ORT - outer retinal tubulation. SRHM - subretinal hyper-reflective material. IRHM - intraretinal hyper-reflective material            ASSESSMENT/PLAN:    ICD-10-CM   1. Mild nonproliferative diabetic retinopathy of both eyes without macular edema associated with type 2 diabetes mellitus (HCC)  R42.7062 OCT, Retina - OU - Both Eyes    2. Essential hypertension  I10     3. Hypertensive retinopathy of both eyes  H35.033     4. Combined forms of age-related cataract of both eyes  H25.813      Mild nonproliferative diabetic retinopathy w/o DME, both eyes - The incidence, risk factors for progression, natural history and treatment options for diabetic retinopathy were discussed with patient.    - The need for close monitoring of blood glucose, blood pressure, and serum lipids, avoiding cigarette or any type of tobacco, and the need for long term follow up was also discussed with patient. - exam shows rare scattered MA OU  - OCT without diabetic macular edema, both eyes  - f/u in 6 mos -- DFE/OCT  2,3. Hypertensive retinopathy OU - discussed importance of tight BP control - monitor  4. Mixed Cataract OU - The symptoms of cataract, surgical options, and treatments and risks were discussed with patient. - discussed diagnosis and progression - not yet visually significant - monitor for now  Ophthalmic Meds Ordered this visit:  No orders of the defined types were placed in this encounter.    Return in about 6 months (around 03/24/2022) for mild NPDR, Dilated Exam, OCT.  There are no Patient Instructions on file for this visit.   Explained the diagnoses, plan, and follow up with the patient and they expressed understanding.  Patient expressed understanding of the importance of proper follow up care.   This document serves as a record of services personally performed by Gardiner Sleeper, MD, PhD. It was created on their behalf by San Jetty. Owens Shark, OA an ophthalmic technician. The creation of this record is the provider's dictation and/or activities during the visit.    Electronically signed by: San Jetty. Owens Shark,  OA 02.06.2023 1:26 PM  Gardiner Sleeper, M.D., Ph.D. Diseases & Surgery of the Retina and [place]  I have reviewed the above documentation for accuracy and completeness, and I agree with the above. Gardiner Sleeper, M.D., Ph.D. 09/25/21 1:26 PM   Abbreviations: M myopia (nearsighted); A astigmatism; H hyperopia (farsighted); P presbyopia; Mrx spectacle prescription;  CTL contact lenses; OD right eye; OS left eye; OU both eyes  XT exotropia; ET esotropia; PEK punctate epithelial keratitis; PEE punctate epithelial erosions; DES dry eye  syndrome; MGD meibomian gland dysfunction; ATs artificial tears; PFAT's preservative free artificial tears; [place] nuclear sclerotic cataract; PSC posterior subcapsular cataract; ERM epi-retinal membrane; PVD posterior vitreous detachment; RD retinal detachment; DM diabetes mellitus; DR diabetic retinopathy; NPDR non-proliferative diabetic retinopathy; PDR proliferative diabetic retinopathy; CSME clinically significant macular edema; DME diabetic macular edema; dbh dot blot hemorrhages; CWS cotton wool spot; POAG primary open angle glaucoma; C/D cup-to-disc ratio; HVF humphrey visual field; GVF goldmann visual field; OCT optical coherence tomography; IOP intraocular pressure; BRVO Branch retinal vein occlusion; CRVO central retinal vein occlusion; CRAO central retinal artery occlusion; BRAO branch retinal artery occlusion; RT retinal tear; SB scleral buckle; PPV pars plana vitrectomy; VH Vitreous hemorrhage; PRP panretinal laser photocoagulation; IVK intravitreal kenalog; VMT vitreomacular traction; MH Macular hole;  NVD neovascularization of the disc; NVE neovascularization elsewhere; AREDS age related eye disease study; ARMD age related macular degeneration; POAG primary open angle glaucoma; EBMD epithelial/anterior basement membrane dystrophy; ACIOL anterior chamber intraocular lens; IOL intraocular lens; PCIOL posterior chamber intraocular lens; Phaco/IOL phacoemulsification with intraocular lens placement; [place] photorefractive keratectomy; LASIK laser assisted in situ keratomileusis; HTN hypertension; DM diabetes mellitus; COPD chronic obstructive pulmonary disease

## 2021-09-23 NOTE — Progress Notes (Addendum)
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 She states that she received IV iron in the past with anaphylaxis and does not want any more IV iron. She is currently taking an oral iron supplement once a day and tolerating nicely.  Hgb is 10.6, MCV 89, WBC count 5.4 and platelets 216.  She denies noting any blood loss. No bruising or petechiae.  She states that she is through the change of life and has not had a cycle in years.  She is symptomatic with fatigue.  No known family history of anemia.  She has history of low-grade follicular lymphoma stage III diagnosed in 2020. She was treated with 6 cycles of Rituxan and Bendamustine with 2 dose reductions. Her last PET scan was in 03/2020 and showed "near resolution of the large hypermetabolic central abdominal mass. Ill defined tissue within the left upper quadrant central mesentery with mild metabolic activity. Complete resolution of the remaining previously seen hypermetabolic nodal masses". She has not followed up since 09/2020.  She has n/v and abdominal pain with leftovers and only cooks for herself and does not eat out. She does eat meat with the exception of pork.  She takes omeprazole daily and zofran as needed which helps.   She is diabetic and takes metformin and ozempic. Hgb A1c in January was 7.3.  No history of thyroid disease.  Her sister had breast cancer and a brother with leukemia.  She states that she is up to date on her mammogram and results were negative.  She states that she has had  a colonoscopy (2017) but bowel prep was poor but no follow-up since.  She is getting over a respiratory infection and is currently on Augmentin. She states that she is symptoms of a vaginal yeast infection and plans to stop by her PCP office downstairs on the way out to request diflucan. She notes that her cough is much better.  No fever, chills, rash, dizziness, SOB, chest pain, palpitations or changes in bowel or bladder habits.  She has 3 children and history of 1 miscarriage within the first few weeks of pregnancy.  She is originally from [place] and has lived in the [place] for over 26 years.  She drives a taxi.  She states that she enjoys walking outside for exercise when it is warm outside.  No smoking, ETOH or recreational drug use.  She does have a good appetite and is doing her best to stay well hydrated. Her weight is 164 lbs.   ROS: All other 10 point review of systems is negative.   PAST MEDICAL HISTORY:   Past Medical History:  Diagnosis Date   Hemoptysis, unspecified 12/2012, 12/2013   HTN (hypertension) 11/12/2012   Hyperlipidemia    Obesity 12/09/2010   Podagra 09/24/2012   Right great toe MTP joint tenderness and redness, no skin breakdown, exam most consistent with podagra. NSAID and low-protein diet. Uric acid and CBC today.     Pyelonephritis    hx/notes 06/26/2015   Sepsis [facility]) hospitalized  06/26/2015   Sickle cell trait (HCC)    Type II diabetes mellitus (HCC)     ALLERGIES: Allergies  Allergen Reactions   Allopurinol Other (See Comments)    Neutropenia   Duloxetine Other (See Comments)    Constipation    Shellfish Allergy Itching      MEDICATIONS:  Current Outpatient Medications on File Prior to Visit  Medication Sig Dispense Refill   amLODipine (NORVASC) 5 MG tablet Take 5 mg by mouth daily.     amoxicillin-clavulanate (AUGMENTIN) 875-125 MG tablet Take 1 tablet by mouth 2 (two) times daily for 10 days. 20 tablet 0   aspirin EC 81 MG tablet Take 81 mg  by mouth daily.     atorvastatin (LIPITOR) 40 MG tablet Take 1 tablet (40 mg total) by mouth daily. 90 tablet 3   lisinopril (PRINIVIL,ZESTRIL) 20 MG tablet Take 1 tablet (20 mg total) by mouth daily. 90 tablet 3   metFORMIN (GLUCOPHAGE) 1000 MG tablet Take 1 tablet (1,000 mg total) by mouth 2 (two) times daily with a meal. 180 tablet 1   ondansetron (ZOFRAN ODT) 8 MG disintegrating tablet Take 1 tablet (8 mg total) by mouth every 8 (eight) hours as needed for nausea or vomiting. 10 tablet 0   OZEMPIC, 0.25 OR 0.5 MG/DOSE, 2 MG/1.5ML SOPN Inject 0.5 mg into the skin once a week.     Vitamin D, Ergocalciferol, (DRISDOL) 50000 UNITS CAPS capsule Take 50,000 Units by mouth every 7 (seven) days.     No current facility-administered medications on file prior to visit.     PAST SURGICAL HISTORY Past Surgical History:  Procedure Laterality Date   TUBAL LIGATION  2006    FAMILY HISTORY: Family History  Problem Relation Age of Onset   Diabetes Father    Diabetes Sister    Diabetes Brother    Breast cancer Sister        breast cancer in her 85's and passed away about 24. Not sure if she passed away from [place]:  reports that she has never smoked. She has never used smokeless tobacco. She reports that she does not drink alcohol and does not use drugs.  PERFORMANCE STATUS: The patient's performance status is 1 - Symptomatic but completely ambulatory  PHYSICAL EXAM: Most Recent Vital Signs: There were no vitals taken for this visit. BP 135/64 (BP Location: Right Arm, Patient Position: Sitting)    Pulse 89    Temp 97.9 F (36.6 C) (Oral)    Resp 18    Ht 5\' 5"  (1.651 m)    Wt 164 lb 1.9 oz (74.4 kg)    SpO2 100%    BMI 27.31 kg/m   General Appearance:    Alert, cooperative, no distress, appears stated age  Head:    Normocephalic, without obvious abnormality, atraumatic  Eyes:    PERRL, conjunctiva/corneas clear, EOM's intact, fundi    benign, both eyes         Throat:   Lips, mucosa, and tongue normal; teeth and gums normal  Neck:   Supple, symmetrical, trachea midline, no adenopathy;    thyroid:  no enlargement/tenderness/nodules; no carotid   bruit or JVD  Back:     Symmetric, no curvature, ROM normal, no CVA tenderness  Lungs:     Clear to auscultation bilaterally, respirations unlabored  Chest Wall:    No tenderness or deformity   Heart:    Regular rate and rhythm,  S1 and S2 normal, no murmur, rub   or gallop     Abdomen:     Soft, non-tender, bowel sounds active all four quadrants,    no masses, no organomegaly        Extremities:   Extremities normal, atraumatic, no cyanosis or edema  Pulses:   2+ and symmetric all extremities  Skin:   Skin color, texture, turgor normal, no rashes or lesions  Lymph nodes:   Cervical, supraclavicular, and axillary nodes normal  Neurologic:   CNII-XII intact, normal strength, sensation and reflexes    throughout    LABORATORY DATA:  Results for orders placed or performed in visit on 09/23/21 (from the past 48 hour(s))  Save Smear (SSMR)     Status: None   Collection Time: 09/23/21 10:20 AM  Result Value Ref Range   Smear Review SMEAR STAINED AND AVAILABLE FOR REVIEW     Comment: Performed at [facility] at [facility], [address], [place], [place] [address]  CBC with Differential ([place] Only)     Status: Abnormal   Collection Time: 09/23/21 10:20 AM  Result Value Ref Range   WBC Count 5.4 4.0 - 10.5 K/uL   RBC 3.51 (L) 3.87 - 5.11 MIL/uL   Hemoglobin 10.6 (L) 12.0 - 15.0 g/dL   HCT 31.4 (L) 36.0 - 46.0 %   MCV 89.5 80.0 - 100.0 fL   MCH 30.2 26.0 - 34.0 pg   MCHC 33.8 30.0 - 36.0 g/dL   RDW 12.6 11.5 - 15.5 %   Platelet Count 216 150 - 400 K/uL   nRBC 0.0 0.0 - 0.2 %   Neutrophils Relative % 49 %   Neutro Abs 2.7 1.7 - 7.7 K/uL   Lymphocytes Relative 26 %   Lymphs Abs 1.4 0.7 - 4.0 K/uL   Monocytes Relative 8 %   Monocytes Absolute 0.4 0.1  - 1.0 K/uL   Eosinophils Relative 14 %   Eosinophils Absolute 0.8 (H) 0.0 - 0.5 K/uL   Basophils Relative 1 %   Basophils Absolute 0.0 0.0 - 0.1 K/uL   Immature Granulocytes 2 %   Abs Immature Granulocytes 0.08 (H) 0.00 - 0.07 K/uL    Comment: Performed at [facility] at [facility], [address] [place], [place] [address]  Reticulocytes     Status: Abnormal   Collection Time: 09/23/21 10:21 AM  Result Value Ref Range   Retic Ct Pct 2.4 0.4 - 3.1 %   RBC. 3.53 (L) 3.87 - 5.11 MIL/uL   Retic Count, Absolute 85.4 19.0 - 186.0 K/uL   Immature Retic Fract 13.8 2.3 - 15.9 %    Comment: Performed at [facility] at [facility], [address], [place], [place] [address]      RADIOGRAPHY: No results found.     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  Anemia work up pending.  She prefers to take oral supplement which is certainly fine. She will continue her same oral iron supplement and we will also add folic acid for the history of sickle cell trait.  We will also get a beta 2 microglobulin as well as CT of chest,  abdomen and pelvis to evaluate. Blood smear and CBC/CMP evaluated by Dr. Marin Olp. No abnormality or evidence of malignancy noted.   Follow-up in 3 months.   All questions were answered. The patient knows to call the clinic with any problems, questions or concerns. We can certainly see the patient much sooner if necessary.  The patient was discussed with Dr. Marin Olp and he is in agreement with the aforementioned.   Lottie Dawson, NP

## 2021-09-23 NOTE — Telephone Encounter (Signed)
Patients states that she believes to have a yeast infection from antibiotic she was prescribed Can we send in a diflucan for her  Please advise

## 2021-09-24 ENCOUNTER — Encounter (INDEPENDENT_AMBULATORY_CARE_PROVIDER_SITE_OTHER): Payer: Self-pay | Admitting: Ophthalmology

## 2021-09-24 ENCOUNTER — Ambulatory Visit (INDEPENDENT_AMBULATORY_CARE_PROVIDER_SITE_OTHER): Payer: 59 | Admitting: Ophthalmology

## 2021-09-24 DIAGNOSIS — E113293 Type 2 diabetes mellitus with mild nonproliferative diabetic retinopathy without macular edema, bilateral: Secondary | ICD-10-CM | POA: Diagnosis not present

## 2021-09-24 DIAGNOSIS — H25813 Combined forms of age-related cataract, bilateral: Secondary | ICD-10-CM | POA: Diagnosis not present

## 2021-09-24 DIAGNOSIS — H35033 Hypertensive retinopathy, bilateral: Secondary | ICD-10-CM | POA: Diagnosis not present

## 2021-09-24 DIAGNOSIS — E113393 Type 2 diabetes mellitus with moderate nonproliferative diabetic retinopathy without macular edema, bilateral: Secondary | ICD-10-CM

## 2021-09-24 DIAGNOSIS — I10 Essential (primary) hypertension: Secondary | ICD-10-CM

## 2021-09-24 DIAGNOSIS — H3581 Retinal edema: Secondary | ICD-10-CM

## 2021-09-24 LAB — BETA 2 MICROGLOBULIN, SERUM: Beta-2 Microglobulin: 2.5 mg/L — ABNORMAL HIGH (ref 0.6–2.4)

## 2021-09-24 LAB — FERRITIN: Ferritin: 42 ng/mL (ref 11–307)

## 2021-09-24 LAB — ERYTHROPOIETIN: Erythropoietin: 22.1 m[IU]/mL — ABNORMAL HIGH (ref 2.6–18.5)

## 2021-09-25 ENCOUNTER — Telehealth: Payer: Self-pay | Admitting: *Deleted

## 2021-09-25 ENCOUNTER — Encounter (INDEPENDENT_AMBULATORY_CARE_PROVIDER_SITE_OTHER): Payer: Self-pay | Admitting: Ophthalmology

## 2021-09-25 LAB — HGB FRACTIONATION CASCADE
Hgb A2: 3.3 % — ABNORMAL HIGH (ref 1.8–3.2)
Hgb A: 55.6 % — ABNORMAL LOW (ref 96.4–98.8)
Hgb F: 0.6 % (ref 0.0–2.0)
Hgb S: 40.5 % — ABNORMAL HIGH

## 2021-09-25 LAB — HGB SOLUBILITY: Hgb Solubility: POSITIVE — AB

## 2021-09-25 NOTE — Telephone Encounter (Signed)
Per 09/23/21 los - called and gave upcoming appointments - confirmed

## 2021-09-30 ENCOUNTER — Encounter (HOSPITAL_BASED_OUTPATIENT_CLINIC_OR_DEPARTMENT_OTHER): Payer: Self-pay

## 2021-09-30 ENCOUNTER — Other Ambulatory Visit: Payer: Self-pay

## 2021-09-30 ENCOUNTER — Ambulatory Visit (HOSPITAL_BASED_OUTPATIENT_CLINIC_OR_DEPARTMENT_OTHER)
Admission: RE | Admit: 2021-09-30 | Discharge: 2021-09-30 | Disposition: A | Discharge status: Home / Self Care | Payer: 59 | Source: Ambulatory Visit | Attending: Family | Admitting: Family

## 2021-09-30 DIAGNOSIS — C8213 Follicular lymphoma grade II, intra-abdominal lymph nodes: Secondary | ICD-10-CM | POA: Insufficient documentation

## 2021-09-30 MED ORDER — IOHEXOL 300 MG/ML  SOLN
100.0000 mL | Freq: Once | INTRAMUSCULAR | Status: AC | PRN
  Administered 2021-09-30: 100 mL via INTRAVENOUS

## 2021-10-01 LAB — ALPHA-THALASSEMIA GENOTYPR

## 2021-10-15 ENCOUNTER — Telehealth: Payer: Self-pay

## 2021-10-15 NOTE — Telephone Encounter (Signed)
LMTCB for lab results.  

## 2021-10-15 NOTE — Telephone Encounter (Signed)
-----   Message from Celso Amy, NP sent at 10/14/2021  1:15 PM EST ----- Iron studies look good! Continue folic acid for sickle cell trait. No thalassemia anemia detected.  He CT scans were stable!!! WOO HOO!!! We will see her for follow-up in May and plan to repeat her CT scans in 6 more months. Thank you!  ----- Message ----- From: Interface, Lab In [place] Sent: 09/23/2021  10:37 AM EST To: Celso Amy, NP

## 2021-10-15 NOTE — Telephone Encounter (Signed)
Called and informed patient of lab results, patient verbalized understanding and denies any questions or concerns at this time.   

## 2021-10-16 ENCOUNTER — Ambulatory Visit (INDEPENDENT_AMBULATORY_CARE_PROVIDER_SITE_OTHER): Payer: 59 | Admitting: Family Medicine

## 2021-10-16 ENCOUNTER — Encounter: Payer: Self-pay | Admitting: Family Medicine

## 2021-10-16 ENCOUNTER — Other Ambulatory Visit: Payer: Self-pay | Admitting: Family Medicine

## 2021-10-16 VITALS — BP 122/70 | HR 87 | Temp 98.0°F | Ht 63.0 in | Wt 163.2 lb

## 2021-10-16 DIAGNOSIS — J069 Acute upper respiratory infection, unspecified: Secondary | ICD-10-CM

## 2021-10-16 DIAGNOSIS — H1033 Unspecified acute conjunctivitis, bilateral: Secondary | ICD-10-CM | POA: Diagnosis not present

## 2021-10-16 MED ORDER — POLYMYXIN B-TRIMETHOPRIM 10000-0.1 UNIT/ML-% OP SOLN: 2.0000 [drp] | Freq: Four times a day (QID) | OPHTHALMIC | 0 refills | Status: DC

## 2021-10-16 MED ORDER — TOBRAMYCIN 0.3 % OP SOLN: 2.0000 [drp] | Freq: Four times a day (QID) | OPHTHALMIC | 0 refills | Status: AC

## 2021-10-16 MED ORDER — BENZONATATE 200 MG PO CAPS: 200.0000 mg | ORAL_CAPSULE | Freq: Two times a day (BID) | ORAL | 0 refills | Status: DC | PRN

## 2021-10-16 MED ORDER — PREDNISONE 20 MG PO TABS
40.0000 mg | ORAL_TABLET | Freq: Every day | ORAL | 0 refills | Status: AC
Start: 2021-10-16 — End: 2021-10-21

## 2021-10-16 NOTE — Patient Instructions (Signed)
Continue to push fluids, practice good hand hygiene, and cover your mouth if you cough. ? ?If you start having fevers, shaking or shortness of breath, seek immediate care. ? ?OK to take Tylenol 1000 mg (2 extra strength tabs) or 975 mg (3 regular strength tabs) every 6 hours as needed. ? ?Call Monday and ask to speak to my team if you aren't better. ? ?Let us know if you need anything. ?

## 2021-10-16 NOTE — Progress Notes (Signed)
Chief Complaint  ?Patient presents with  ? Eye Drainage  ?  Diarrhea ?Cough ?Sore throat ?  ? ? ?Brandy Lynn here for URI complaints. ? ?Duration: 5 days  ?Associated symptoms: sinus headache, sinus congestion, sinus pain, itchy watery eyes, red eyes, ear fullness, sore throat, diarrhea- resolved, and coughing ?Denies: rhinorrhea, ear pain, ear drainage, wheezing, shortness of breath, myalgia, and fevers, N/V, abd pain, loss of taste/smell.  ?Treatment to date: Ginger, tea, Robitussin ?Sick contacts: Yes; daughter who is better ? ?Past Medical History:  ?Diagnosis Date  ? Hemoptysis, unspecified 12/2012, 12/2013  ? HTN (hypertension) 11/12/2012  ? Hyperlipidemia   ? Obesity 12/09/2010  ? Podagra 09/24/2012  ? Right great toe MTP joint tenderness and redness, no skin breakdown, exam most consistent with podagra. NSAID and low-protein diet. Uric acid and CBC today.    ? Pyelonephritis   ? hx/notes 06/26/2015  ? Sepsis [facility]) hospitalized 06/26/2015  ? Sickle cell trait ([place])   ? Type II diabetes mellitus ([place])   ? ? ?Objective ?BP 122/70   Pulse 87   Temp 98 ?F (36.7 ?C) (Oral)   Ht 5\' 3"  (1.6 m)   Wt 163 lb 4 oz (74 kg)   SpO2 99%   BMI 28.92 kg/m?  ?General: Awake, alert, appears stated age ?HEENT: AT, [place], ears patent b/l and TM's neg, nares patent w/o discharge, pharynx pink and without exudates, MMM, eyes w thick/white d/c, worse on R, minimally injected sclera on R ?Neck: No masses or asymmetry ?Heart: RRR ?Lungs: CTAB, no accessory muscle use ?Psych: Age appropriate judgment and insight, normal mood and affect ? ?Viral URI with cough - Plan: predniSONE (DELTASONE) 20 MG tablet, benzonatate (TESSALON) 200 MG capsule ? ?Acute conjunctivitis of both eyes, unspecified acute conjunctivitis type - Plan: trimethoprim-polymyxin b (POLYTRIM) ophthalmic solution ? ?5 d pred burst given sinus issues. Benzonatate prn. Polytrim for eye. Continue to push fluids, practice good hand hygiene, cover mouth when coughing. ?F/u  prn. If starting to experience fevers, shaking, or shortness of breath, seek immediate care. ?Pt voiced understanding and agreement to the plan. ? ?Shelda Pal, DO ?10/16/21 ?9:30 AM ? ?

## 2021-12-11 ENCOUNTER — Encounter: Payer: Self-pay | Admitting: Family Medicine

## 2021-12-11 ENCOUNTER — Ambulatory Visit (INDEPENDENT_AMBULATORY_CARE_PROVIDER_SITE_OTHER): Payer: 59 | Admitting: Family Medicine

## 2021-12-11 VITALS — BP 116/73 | HR 106 | Ht 63.0 in | Wt 160.2 lb

## 2021-12-11 DIAGNOSIS — R109 Unspecified abdominal pain: Secondary | ICD-10-CM

## 2021-12-11 DIAGNOSIS — E782 Mixed hyperlipidemia: Secondary | ICD-10-CM

## 2021-12-11 DIAGNOSIS — M25561 Pain in right knee: Secondary | ICD-10-CM

## 2021-12-11 DIAGNOSIS — I10 Essential (primary) hypertension: Secondary | ICD-10-CM

## 2021-12-11 DIAGNOSIS — Z1231 Encounter for screening mammogram for malignant neoplasm of breast: Secondary | ICD-10-CM

## 2021-12-11 DIAGNOSIS — E119 Type 2 diabetes mellitus without complications: Secondary | ICD-10-CM

## 2021-12-11 DIAGNOSIS — G8929 Other chronic pain: Secondary | ICD-10-CM

## 2021-12-11 LAB — POC URINALSYSI DIPSTICK (AUTOMATED)
Bilirubin, UA: NEGATIVE
Blood, UA: NEGATIVE
Glucose, UA: NEGATIVE
Ketones, UA: NEGATIVE
Leukocytes, UA: NEGATIVE
Nitrite, UA: NEGATIVE
Protein, UA: NEGATIVE
Spec Grav, UA: 1.01 (ref 1.010–1.025)
Urobilinogen, UA: 0.2 E.U./dL
pH, UA: 7 (ref 5.0–8.0)

## 2021-12-11 LAB — URINALYSIS, MICROSCOPIC ONLY: RBC / HPF: NONE SEEN (ref 0–?)

## 2021-12-11 NOTE — Assessment & Plan Note (Signed)
Well controlled with last A1c 7.3% ?Continue current medications - metformin 1,000 mg BID, Ozempic 0.5 mg weekly  ?UTD on vaccines, eye exam, foot exam ?On ACEi  ?On Statin  ?Discussed diet and exercise ?F/u in 3 months ?

## 2021-12-11 NOTE — Patient Instructions (Addendum)
For your knee: ?Tylenol ?Rest, Ice, Compression, Elevation ?Exercises provided  ?If not improving, we can xray to confirm arthritis ? ?For your low back/flank pain: ?Urine was normal but I will send it off for culture. Make sure you are staying well hydrated.  ?Could also be musculoskeletal given your recent increase in activity and discomfort with postion changes - try heating pad, massage, tylenol, rest ?Follow-up if worsening or not improving  ? ?Hypertension: ?Blood pressure is at goal for age and co-morbidities.  I recommend continuing lisinopril 20 mg daily and amlodipine 5 mg daily.   ?- BP goal <130/80 ?- monitor and log blood pressures at home ?- check around the same time each day in a relaxed setting ?- Limit salt to <2000 mg/day ?- Follow DASH eating plan (heart healthy diet) ?- limit alcohol to 2 standard drinks per day for men and 1 per day for women ?- avoid tobacco products ?- get at least 2 hours of regular aerobic exercise weekly ?Patient aware of signs/symptoms requiring further/urgent evaluation. ?Labs ordered. ? ?Diabetes: ?Well controlled with last A1c 7.3% ?Continue current medications ?UTD on vaccines, eye exam, foot exam ?On ACEi  ?On Statin  ?Discussed diet and exercise ?F/u in 3 months ? ?

## 2021-12-11 NOTE — Assessment & Plan Note (Signed)
Stable 3 months ago. Continue Lipitor. Will wait to recheck at next follow-up. Continue heart healthy diet and exercise as tolerated.  ?

## 2021-12-11 NOTE — Progress Notes (Signed)
? ?Established Patient Office Visit ? ?Subjective   ?Patient ID: Brandy Lynn, female    DOB: 03/30/1966  Age: 56 y.o. MRN: [id number] ? ?CC: routine f/u, knee pain, flank pain  ? ? ?HPI ?Patient is here for routine follow-up on her diabetes and hypertension. She would also like to discuss some right flank pain and right knee pain.  ? ?Right lower back/flank pain started 1 week ago and occasionally radiates into right abdomen. She denies any dysuria, hematuria, urgency, fevers, nausea, vomiting , diarrhea, but has had a mild increase in frequency. Last week she did feel weak/fatigued, but is starting to feel back to baseline now. She does note that the pain tends to be worse with certain twisting/bending movements.   ? ?Right knee pain has been chronic (off and on for years, previously a hairdresser, now drives a taxi), but more constant this week. Reports she has been very busy and not able to rest/take it easy for the past few weeks. She has not noticed any popping, locking, instability, swelling, bruising. She describes the pain as moderate aching/stiffness that improves with walking. She has been using occasional tylenol with some improvement.  ? ?DIABETES: ?- Checking BG at home: no ?- Medications: metformin 1,000 mg BID, Ozempic 0.5 mg weekly (for more than 4 weeks now), Lipitor 40 mg daily - cholesterol stable at last check in January ?- Compliance: good ?- Diet: healthy, low carb/sodium  ?- Exercise: active lifestyle  ?- Eye exam: ordered  ?- Foot exam: UTD ?- Microalbumin: today ?- Denies symptoms of hypoglycemia, polyuria, polydipsia, numbness extremities, foot ulcers/trauma, wounds that are not healing, medication side effects  ? ? ?HYPERTENSION: ?- Medications: amlodipine 5 mg daily, lisinopril 20 mg daily ?- Compliance: good  ?- Checking BP at home: no ?- Denies any SOB, recurrent headaches, CP, vision changes, LE edema, dizziness, palpitations, or medication side effects. ?- Diet: low sodium/carb ?-  Exercise: active lifestyle  ? ? ? ? ? ? ? ?ROS ?All review of systems negative except what is listed in the HPI ? ?  ?Objective:  ?  ? ?BP 116/73   Pulse (!) 106   Ht '5\' 3"'$  (1.6 m)   Wt 160 lb 3.2 oz (72.7 kg)   BMI 28.38 kg/m?  ? ? ?Physical Exam ?Vitals reviewed.  ?Constitutional:   ?   Appearance: Normal appearance.  ?HENT:  ?   Head: Normocephalic and atraumatic.  ?Cardiovascular:  ?   Rate and Rhythm: Normal rate and regular rhythm.  ?   Pulses: Normal pulses.  ?   Heart sounds: Normal heart sounds.  ?Pulmonary:  ?   Effort: Pulmonary effort is normal.  ?   Breath sounds: Normal breath sounds.  ?Abdominal:  ?   General: Abdomen is flat. There is no distension.  ?   Palpations: Abdomen is soft. There is no mass.  ?   Tenderness: There is no abdominal tenderness. There is no right CVA tenderness, left CVA tenderness, guarding or rebound.  ?Musculoskeletal:     ?   General: No swelling or tenderness. Normal range of motion.  ?   Cervical back: Normal range of motion and neck supple.  ?   Right lower leg: No edema.  ?   Left lower leg: No edema.  ?   Comments: No pain or instability with ligament testing   ?Skin: ?   General: Skin is warm and dry.  ?Neurological:  ?   General: No focal deficit present.  ?  Mental Status: She is alert and oriented to person, place, and time. Mental status is at baseline.  ?Psychiatric:     ?   Mood and Affect: Mood normal.     ?   Behavior: Behavior normal.     ?   Thought Content: Thought content normal.     ?   Judgment: Judgment normal.  ? ? ? ?Results for orders placed or performed in visit on 12/11/21  ?POCT Urinalysis Dipstick (Automated)  ?Result Value Ref Range  ? Color, UA yellow   ? Clarity, UA clear   ? Glucose, UA Negative Negative  ? Bilirubin, UA neg   ? Ketones, UA neg   ? Spec Grav, UA 1.010 1.010 - 1.025  ? Blood, UA neg   ? pH, UA 7.0 5.0 - 8.0  ? Protein, UA Negative Negative  ? Urobilinogen, UA 0.2 0.2 or 1.0 E.U./dL  ? Nitrite, UA neg   ? Leukocytes, UA  Negative Negative  ? ? ? ? ?The ASCVD Risk score (Arnett DK, et al., 2019) failed to calculate for the following reasons: ?  The valid total cholesterol range is 130 to 320 mg/dL ? ?  ?Assessment & Plan:  ? ?1. Hypertension, unspecified type ?Blood pressure is at goal for age and co-morbidities.  I recommend continuing lisinopril 20 mg daily and amlodipine 5 mg daily.   ?- BP goal <130/80 ?- monitor and log blood pressures at home ?- check around the same time each day in a relaxed setting ?- Limit salt to <2000 mg/day ?- Follow DASH eating plan (heart healthy diet) ?- limit alcohol to 2 standard drinks per day for men and 1 per day for women ?- avoid tobacco products ?- get at least 2 hours of regular aerobic exercise weekly ?Patient aware of signs/symptoms requiring further/urgent evaluation. ?Labs ordered. ?- CBC with Differential/Platelet; Future ?- Comprehensive metabolic panel; Future ? ?2. Type 2 diabetes mellitus without complication, without long-term current use of insulin ([place]) ?Well controlled with last A1c 7.3% ?Continue current medications - metformin 1,000 mg BID, Ozempic 0.5 mg weekly  ?UTD on vaccines, eye exam, foot exam ?On ACEi  ?On Statin  ?Discussed diet and exercise ?F/u in 3 months ?- Hemoglobin A1c; Future ?- CBC with Differential/Platelet; Future ?- Comprehensive metabolic panel; Future ?- Urine Microalbumin w/creat. ratio ? ?3. Mixed hyperlipidemia ?Stable 3 months ago. Continue Lipitor. Will wait to recheck at next follow-up. Continue heart healthy diet and exercise as tolerated.  ? ?4. Right flank pain ?Urine was normal but I will send it off for culture. Make sure you are staying well hydrated.  ?Could also be musculoskeletal given your recent increase in activity and discomfort with postion changes - try heating pad, massage, tylenol, rest ?Follow-up if worsening or not improving  ?- POCT Urinalysis Dipstick (Automated) ?- Urine Culture ?- Urine Microscopic Only ?- CBC with  Differential/Platelet; Future ?- Comprehensive metabolic panel; Future ? ?5. Encounter for screening mammogram for malignant neoplasm of breast ?- MM DIGITAL SCREENING BILATERAL; Future ? ?6. Right knee pain  ?Tylenol PRN; caution with NSAIDs given last renal function - rechecking today ?Rest, Ice, Compression, Elevation ?Exercises provided  ?If not improving, we can xray to confirm arthritis ? ?Return in about 3 months (around 03/12/2022) for routine f/u (1 week lab appointment).  ? ? ?Terrilyn Saver, NP ? ?

## 2021-12-11 NOTE — Assessment & Plan Note (Signed)
Blood pressure is at goal for age and co-morbidities.  I recommend continuing lisinopril 20 mg daily and amlodipine 5 mg daily.   ?- BP goal <130/80 ?- monitor and log blood pressures at home ?- check around the same time each day in a relaxed setting ?- Limit salt to <2000 mg/day ?- Follow DASH eating plan (heart healthy diet) ?- limit alcohol to 2 standard drinks per day for men and 1 per day for women ?- avoid tobacco products ?- get at least 2 hours of regular aerobic exercise weekly ?Patient aware of signs/symptoms requiring further/urgent evaluation. ?Labs ordered. ?

## 2021-12-12 LAB — URINE CULTURE
MICRO NUMBER:: 13315017
SPECIMEN QUALITY:: ADEQUATE

## 2021-12-13 ENCOUNTER — Encounter: Payer: Self-pay | Admitting: *Deleted

## 2021-12-18 ENCOUNTER — Other Ambulatory Visit (INDEPENDENT_AMBULATORY_CARE_PROVIDER_SITE_OTHER): Payer: 59

## 2021-12-18 DIAGNOSIS — E119 Type 2 diabetes mellitus without complications: Secondary | ICD-10-CM | POA: Diagnosis not present

## 2021-12-18 DIAGNOSIS — I10 Essential (primary) hypertension: Secondary | ICD-10-CM

## 2021-12-18 DIAGNOSIS — R109 Unspecified abdominal pain: Secondary | ICD-10-CM

## 2021-12-18 LAB — COMPREHENSIVE METABOLIC PANEL
ALT: 16 U/L (ref 0–35)
AST: 17 U/L (ref 0–37)
Albumin: 4.3 g/dL (ref 3.5–5.2)
Alkaline Phosphatase: 53 U/L (ref 39–117)
BUN: 12 mg/dL (ref 6–23)
CO2: 30 mEq/L (ref 19–32)
Calcium: 9.1 mg/dL (ref 8.4–10.5)
Chloride: 102 mEq/L (ref 96–112)
Creatinine, Ser: 1.2 mg/dL (ref 0.40–1.20)
GFR: 50.84 mL/min — ABNORMAL LOW (ref >60.00)
Glucose, Bld: 152 mg/dL — ABNORMAL HIGH (ref 70–99)
Potassium: 4 mEq/L (ref 3.5–5.1)
Sodium: 141 mEq/L (ref 135–145)
Total Bilirubin: 0.6 mg/dL (ref 0.2–1.2)
Total Protein: 6.4 g/dL (ref 6.0–8.3)

## 2021-12-18 LAB — CBC WITH DIFFERENTIAL/PLATELET
Basophils Absolute: 0 10*3/uL (ref 0.0–0.1)
Basophils Relative: 0.5 % (ref 0.0–3.0)
Eosinophils Absolute: 0.3 10*3/uL (ref 0.0–0.7)
Eosinophils Relative: 7.5 % — ABNORMAL HIGH (ref 0.0–5.0)
HCT: 33.4 % — ABNORMAL LOW (ref 36.0–46.0)
Hemoglobin: 11.4 g/dL — ABNORMAL LOW (ref 12.0–15.0)
Lymphocytes Relative: 30.4 % (ref 12.0–46.0)
Lymphs Abs: 1.1 10*3/uL (ref 0.7–4.0)
MCHC: 34.2 g/dL (ref 30.0–36.0)
MCV: 89.1 fl (ref 78.0–100.0)
Monocytes Absolute: 0.5 10*3/uL (ref 0.1–1.0)
Monocytes Relative: 14.9 % — ABNORMAL HIGH (ref 3.0–12.0)
Neutro Abs: 1.6 10*3/uL (ref 1.4–7.7)
Neutrophils Relative %: 46.7 % (ref 43.0–77.0)
Platelets: 198 10*3/uL (ref 150.0–400.0)
RBC: 3.75 Mil/uL — ABNORMAL LOW (ref 3.87–5.11)
RDW: 13.8 % (ref 11.5–15.5)
WBC: 3.5 10*3/uL — ABNORMAL LOW (ref 4.0–10.5)

## 2021-12-18 LAB — HEMOGLOBIN A1C: Hgb A1c MFr Bld: 7.1 % — ABNORMAL HIGH (ref 4.6–6.5)

## 2021-12-19 ENCOUNTER — Encounter: Payer: Self-pay | Admitting: *Deleted

## 2021-12-20 ENCOUNTER — Ambulatory Visit: Payer: 59 | Admitting: Family

## 2021-12-20 ENCOUNTER — Other Ambulatory Visit: Payer: 59

## 2021-12-23 ENCOUNTER — Inpatient Hospital Stay (HOSPITAL_BASED_OUTPATIENT_CLINIC_OR_DEPARTMENT_OTHER): Admission: RE | Admit: 2021-12-23 | Payer: 59 | Source: Ambulatory Visit

## 2021-12-25 ENCOUNTER — Encounter: Payer: Self-pay | Admitting: Family

## 2021-12-25 ENCOUNTER — Inpatient Hospital Stay: Payer: 59 | Attending: Hematology & Oncology

## 2021-12-25 ENCOUNTER — Inpatient Hospital Stay (HOSPITAL_BASED_OUTPATIENT_CLINIC_OR_DEPARTMENT_OTHER): Payer: 59 | Admitting: Family

## 2021-12-25 VITALS — BP 144/67 | HR 105 | Temp 98.4°F | Resp 17 | Wt 158.1 lb

## 2021-12-25 DIAGNOSIS — Z8572 Personal history of non-Hodgkin lymphomas: Secondary | ICD-10-CM | POA: Diagnosis present

## 2021-12-25 DIAGNOSIS — D573 Sickle-cell trait: Secondary | ICD-10-CM | POA: Diagnosis not present

## 2021-12-25 DIAGNOSIS — D509 Iron deficiency anemia, unspecified: Secondary | ICD-10-CM | POA: Diagnosis not present

## 2021-12-25 DIAGNOSIS — C8213 Follicular lymphoma grade II, intra-abdominal lymph nodes: Secondary | ICD-10-CM

## 2021-12-25 LAB — CMP (CANCER CENTER ONLY)
ALT: 14 U/L (ref 0–44)
AST: 14 U/L — ABNORMAL LOW (ref 15–41)
Albumin: 4.5 g/dL (ref 3.5–5.0)
Alkaline Phosphatase: 46 U/L (ref 38–126)
Anion gap: 10 (ref 5–15)
BUN: 18 mg/dL (ref 6–20)
CO2: 30 mmol/L (ref 22–32)
Calcium: 9.8 mg/dL (ref 8.9–10.3)
Chloride: 103 mmol/L (ref 98–111)
Creatinine: 1.14 mg/dL — ABNORMAL HIGH (ref 0.44–1.00)
GFR, Estimated: 57 mL/min — ABNORMAL LOW (ref >60)
Glucose, Bld: 89 mg/dL (ref 70–99)
Potassium: 4.6 mmol/L (ref 3.5–5.1)
Sodium: 143 mmol/L (ref 135–145)
Total Bilirubin: 0.5 mg/dL (ref 0.3–1.2)
Total Protein: 6.6 g/dL (ref 6.5–8.1)

## 2021-12-25 LAB — CBC WITH DIFFERENTIAL (CANCER CENTER ONLY)
Abs Immature Granulocytes: 0.03 10*3/uL (ref 0.00–0.07)
Basophils Absolute: 0 10*3/uL (ref 0.0–0.1)
Basophils Relative: 0 %
Eosinophils Absolute: 0.3 10*3/uL (ref 0.0–0.5)
Eosinophils Relative: 4 %
HCT: 33.1 % — ABNORMAL LOW (ref 36.0–46.0)
Hemoglobin: 11.2 g/dL — ABNORMAL LOW (ref 12.0–15.0)
Immature Granulocytes: 0 %
Lymphocytes Relative: 32 %
Lymphs Abs: 2.4 10*3/uL (ref 0.7–4.0)
MCH: 30.1 pg (ref 26.0–34.0)
MCHC: 33.8 g/dL (ref 30.0–36.0)
MCV: 89 fL (ref 80.0–100.0)
Monocytes Absolute: 0.6 10*3/uL (ref 0.1–1.0)
Monocytes Relative: 8 %
Neutro Abs: 4.1 10*3/uL (ref 1.7–7.7)
Neutrophils Relative %: 56 %
Platelet Count: 280 10*3/uL (ref 150–400)
RBC: 3.72 MIL/uL — ABNORMAL LOW (ref 3.87–5.11)
RDW: 12.6 % (ref 11.5–15.5)
WBC Count: 7.5 10*3/uL (ref 4.0–10.5)
nRBC: 0 % (ref 0.0–0.2)

## 2021-12-25 LAB — RETICULOCYTES
Immature Retic Fract: 22.9 % — ABNORMAL HIGH (ref 2.3–15.9)
RBC.: 3.66 MIL/uL — ABNORMAL LOW (ref 3.87–5.11)
Retic Count, Absolute: 74.3 10*3/uL (ref 19.0–186.0)
Retic Ct Pct: 2 % (ref 0.4–3.1)

## 2021-12-25 LAB — FERRITIN: Ferritin: 18 ng/mL (ref 11–307)

## 2021-12-25 NOTE — Progress Notes (Signed)
?Hematology and Oncology Follow Up Visit ? ?Brandy Lynn ?[id number] ?[birth year] 56 y.o. ?12/25/2021 ? ? 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 ?Current Therapy:   ?IV iron as indicated  ?Folic acid 1 mg PO daily ?  ?Interim History:  Ms. Brandy Lynn is here today for follow-up. She is doing fairly well. She has GERD and states that this causes n/v.  ?She does not have much of an appetite as a result. She does feel that she is staying well hydrated. Her weight is 158 lbs.  ?No fever, chills, cough, rash, dizziness, SOB, chest pain, palpitations, abdominal pain or changes in bowel or bladder habits.  ?No blood loss noted. No bruising or petechiae.  ?No swelling, tenderness, numbness or tingling in her extremities.  ?No falls or syncope to report.  ? ?ECOG Performance Status: 0 - Asymptomatic ? ?Medications:  ?Allergies as of 12/25/2021   ? ?   Reactions  ? Allopurinol Other (See Comments)  ? Neutropenia  ? Ferumoxytol Other (See Comments)  ? ANGIOEDEMA  ? Shellfish Allergy Itching  ? Duloxetine Other (See Comments)  ? Constipation   ? ?  ? ?  ?Medication List  ?  ? ?  ? Accurate as of Dec 25, 2021  2:54 PM. If you have any questions, ask your nurse or doctor.  ?  ?  ? ?  ? ?amLODipine 5 MG tablet ?Commonly known as: NORVASC ?Take 5 mg by mouth daily. ?  ?aspirin EC 81 MG tablet ?Take 81 mg by mouth daily. ?  ?atorvastatin 40 MG tablet ?Commonly known as: LIPITOR ?Take 1 tablet (40 mg total) by mouth daily. ?  ?folic acid 1 MG tablet ?Commonly known as: FOLVITE ?Take 1 tablet (1 mg total) by mouth daily. ?  ?lisinopril 20 MG tablet ?Commonly known as: ZESTRIL ?Take 1 tablet (20 mg total) by mouth daily. ?  ?metFORMIN 1000 MG tablet ?Commonly known as: GLUCOPHAGE ?Take 1 tablet (1,000 mg total) by mouth 2 (two) times daily with a meal. ?  ?ondansetron 8 MG disintegrating tablet ?Commonly known as: Zofran ODT ?Take 1  tablet (8 mg total) by mouth every 8 (eight) hours as needed for nausea or vomiting. ?  ?Ozempic (0.25 or 0.5 MG/DOSE) 2 MG/1.5ML Sopn ?Generic drug: Semaglutide(0.25 or 0.'5MG'$ /DOS) ?Inject 0.5 mg into the skin once a week. ?  ?tobramycin 0.3 % ophthalmic solution ?Commonly known as: Tobrex ?Place 2 drops into both eyes every 6 (six) hours. ?  ?Vitamin D (Ergocalciferol) 1.25 MG (50000 UNIT) Caps capsule ?Commonly known as: DRISDOL ?Take 50,000 Units by mouth every 7 (seven) days. ?  ? ?  ? ? ?Allergies:  ?Allergies  ?Allergen Reactions  ? Allopurinol Other (See Comments)  ?  Neutropenia  ? Ferumoxytol Other (See Comments)  ?  ANGIOEDEMA  ? Shellfish Allergy Itching  ? Duloxetine Other (See Comments)  ?  Constipation   ? ? ?Past Medical History, Surgical history, Social history, and Family History were reviewed and updated. ? ?Review of Systems: ?All other 10 point review of systems is negative.  ? ?Physical Exam: ? vitals were not taken for this visit.  ? ?Wt Readings from Last 3 Encounters:  ?12/11/21 160 lb 3.2 oz (72.7 kg)  ?10/16/21 163 lb 4 oz (74 kg)  ?09/23/21 164 lb 1.9 oz (74.4 kg)  ? ? ?Ocular: Sclerae unicteric, pupils equal, round and reactive to light ?Ear-nose-throat: Oropharynx clear, dentition  fair ?Lymphatic: No cervical or supraclavicular adenopathy ?Lungs no rales or rhonchi, good excursion bilaterally ?Heart regular rate and rhythm, no murmur appreciated ?Abd soft, nontender, positive bowel sounds ?MSK no focal spinal tenderness, no joint edema ?Neuro: non-focal, well-oriented, appropriate affect ?Breasts: Deferred  ? ?Lab Results  ?Component Value Date  ? WBC 3.5 (L) 12/18/2021  ? HGB 11.4 (L) 12/18/2021  ? HCT 33.4 (L) 12/18/2021  ? MCV 89.1 12/18/2021  ? PLT 198.0 12/18/2021  ? ?Lab Results  ?Component Value Date  ? FERRITIN 42 09/23/2021  ? IRON 84 09/23/2021  ? TIBC 337 09/23/2021  ? UIBC 253 09/23/2021  ? IRONPCTSAT 25 09/23/2021  ? ?Lab Results  ?Component Value Date  ? RETICCTPCT 2.4  09/23/2021  ? RBC 3.75 (L) 12/18/2021  ? ?No results found for: KPAFRELGTCHN, LAMBDASER, KAPLAMBRATIO ?No results found for: IGGSERUM, IGA, IGMSERUM ?No results found for: TOTALPROTELP, ALBUMINELP, A1GS, A2GS, BETS, BETA2SER, GAMS, MSPIKE, SPEI ?  Chemistry   ?   ?Component Value Date/Time  ? NA 141 12/18/2021 1105  ? NA 142 09/10/2017 1025  ? K 4.0 12/18/2021 1105  ? CL 102 12/18/2021 1105  ? CO2 30 12/18/2021 1105  ? BUN 12 12/18/2021 1105  ? BUN 10 09/10/2017 1025  ? CREATININE 1.20 12/18/2021 1105  ? CREATININE 1.16 (H) 09/23/2021 1020  ? CREATININE 0.61 05/18/2013 0905  ?    ?Component Value Date/Time  ? CALCIUM 9.1 12/18/2021 1105  ? ALKPHOS 53 12/18/2021 1105  ? AST 17 12/18/2021 1105  ? AST 12 (L) 09/23/2021 1020  ? ALT 16 12/18/2021 1105  ? ALT 15 09/23/2021 1020  ? BILITOT 0.6 12/18/2021 1105  ? BILITOT 0.5 09/23/2021 1020  ?  ? ? ? ?Impression and Plan: Ms. 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  ?Iron studies are pending.  ?She is doing well on daily folic acid.  ?Follow-up in 4 months.  ? ?Lottie Dawson, NP ?5/10/20232:54 PM ? ?

## 2021-12-26 LAB — IRON AND IRON BINDING CAPACITY (CC-WL,HP ONLY)
Iron: 75 ug/dL (ref 28–170)
Saturation Ratios: 21 % (ref 10.4–31.8)
TIBC: 351 ug/dL (ref 250–450)
UIBC: 276 ug/dL (ref 148–442)

## 2021-12-30 ENCOUNTER — Ambulatory Visit (HOSPITAL_BASED_OUTPATIENT_CLINIC_OR_DEPARTMENT_OTHER)
Admission: RE | Admit: 2021-12-30 | Discharge: 2021-12-30 | Disposition: A | Discharge status: Home / Self Care | Payer: 59 | Source: Ambulatory Visit | Attending: Family Medicine | Admitting: Family Medicine

## 2021-12-30 ENCOUNTER — Encounter (HOSPITAL_BASED_OUTPATIENT_CLINIC_OR_DEPARTMENT_OTHER): Payer: Self-pay

## 2021-12-30 DIAGNOSIS — Z1231 Encounter for screening mammogram for malignant neoplasm of breast: Secondary | ICD-10-CM | POA: Diagnosis not present

## 2021-12-31 ENCOUNTER — Encounter: Payer: Self-pay | Admitting: *Deleted

## 2022-01-07 ENCOUNTER — Other Ambulatory Visit: Payer: Self-pay | Admitting: Family Medicine

## 2022-01-07 DIAGNOSIS — B379 Candidiasis, unspecified: Secondary | ICD-10-CM

## 2022-03-12 ENCOUNTER — Ambulatory Visit (INDEPENDENT_AMBULATORY_CARE_PROVIDER_SITE_OTHER): Payer: 59 | Admitting: Family Medicine

## 2022-03-12 ENCOUNTER — Encounter: Payer: Self-pay | Admitting: Family Medicine

## 2022-03-12 VITALS — BP 131/70 | HR 100 | Ht 63.0 in | Wt 159.4 lb

## 2022-03-12 DIAGNOSIS — E119 Type 2 diabetes mellitus without complications: Secondary | ICD-10-CM

## 2022-03-12 DIAGNOSIS — I10 Essential (primary) hypertension: Secondary | ICD-10-CM

## 2022-03-12 DIAGNOSIS — Z8639 Personal history of other endocrine, nutritional and metabolic disease: Secondary | ICD-10-CM | POA: Diagnosis not present

## 2022-03-12 DIAGNOSIS — E782 Mixed hyperlipidemia: Secondary | ICD-10-CM

## 2022-03-12 LAB — MICROALBUMIN / CREATININE URINE RATIO
Creatinine,U: 155.4 mg/dL
Microalb Creat Ratio: 0.5 mg/g (ref 0.0–30.0)
Microalb, Ur: 0.8 mg/dL (ref 0.0–1.9)

## 2022-03-12 MED ORDER — OZEMPIC (0.25 OR 0.5 MG/DOSE) 2 MG/3ML ~~LOC~~ SOPN: 0.5000 mg | PEN_INJECTOR | SUBCUTANEOUS | 1 refills | Status: DC

## 2022-03-12 MED ORDER — VITAMIN D3 125 MCG (5000 UT) PO CAPS: 5000.0000 [IU] | ORAL_CAPSULE | Freq: Every day | ORAL | 1 refills | Status: AC

## 2022-03-12 MED ORDER — MAGNESIUM OXIDE -MG SUPPLEMENT 400 (240 MG) MG PO TABS: 400.0000 mg | ORAL_TABLET | Freq: Every day | ORAL | 1 refills | Status: DC

## 2022-03-12 NOTE — Progress Notes (Signed)
Established Patient Office Visit  Subjective   Patient ID: Brandy Lynn, female    DOB: May 21, 1966  Age: 56 y.o. MRN: [id number]  CC: chronic disease management, routine f/u    HPI Patient reports she is doing well overall. No acute complaints or concerns at this time.    DIABETES: - Checking BG at home: no ; A1c 2 months ago = 7.1% - Medications: metformin 1,000 mg BID, Ozempic 0.5 mg weekly,  - Compliance: good - Diet: heart healthy, low-carb - Exercise: walking the block - Eye exam: UTD - Foot exam: UTD - Microalbumin: today - Denies symptoms of hypoglycemia, polyuria, polydipsia, numbness extremities, foot ulcers/trauma, wounds that are not healing, medication side effects    HYPERTENSION: - Medications: amlodipine 5 mg daily, lisinopril 20 mg daily - Compliance: good - Checking BP at home: no - Denies any SOB, recurrent headaches, CP, vision changes, LE edema, dizziness, palpitations, or medication side effects.    HYPERLIPIDEMIA - medications: atorvastatin 40 mg daily  - compliance: good - medication SEs: none The ASCVD Risk score (Arnett DK, et al., 2019) failed to calculate for the following reasons:   The valid total cholesterol range is 130 to 320 mg/dL   VITAMIN D deficiency: - OTC supplement (5,000 units daily), requesting prescription     She continues to follow with hem/onc for history of follicular lymphoma and iron deficiency anemia.       ROS All review of systems negative except what is listed in the HPI    Objective:     BP 131/70   Pulse 100   Ht '5\' 3"'$  (1.6 m)   Wt 159 lb 6.4 oz (72.3 kg)   BMI 28.24 kg/m    Physical Exam Vitals reviewed.  Constitutional:      Appearance: Normal appearance.  Cardiovascular:     Rate and Rhythm: Normal rate and regular rhythm.     Pulses: Normal pulses.     Heart sounds: Normal heart sounds.  Pulmonary:     Effort: Pulmonary effort is normal.     Breath sounds: Normal breath sounds.   Musculoskeletal:     Right lower leg: No edema.     Left lower leg: No edema.  Skin:    General: Skin is warm and dry.  Neurological:     Mental Status: She is alert and oriented to person, place, and time.  Psychiatric:        Mood and Affect: Mood normal.        Behavior: Behavior normal.        Thought Content: Thought content normal.        Judgment: Judgment normal.      No results found for any visits on 03/12/22.    The ASCVD Risk score (Arnett DK, et al., 2019) failed to calculate for the following reasons:   The valid total cholesterol range is 130 to 320 mg/dL    Assessment & Plan:   Problem List Items Addressed This Visit       Cardiovascular and Mediastinum   HTN (hypertension) - Primary    Blood pressure is at goal for age and co-morbidities.   Recommendations: continue amlodipine 5 mg daily, lisinopril 20 mg daily - BP goal <130/80 - monitor and log blood pressures at home - check around the same time each day in a relaxed setting - Limit salt to <2000 mg/day - Follow DASH eating plan (heart healthy diet) - limit alcohol to 2 standard drinks per  day for men and 1 per day for women - avoid tobacco products - get at least 2 hours of regular aerobic exercise weekly Patient aware of signs/symptoms requiring further/urgent evaluation. Labs updated today.       Relevant Orders   CBC   Comprehensive metabolic panel   Lipid panel   TSH     Endocrine   Type 2 diabetes mellitus without complication, without long-term current use of insulin (HCC)    Well controlled with last A1c 7.1% Continue current medications - metformin 1,000 mg BID, Ozempic 0.5 mg weekly UTD on vaccines, eye exam, foot exam On ACEi  On Statin  Discussed diet and exercise F/u in 6 months       Relevant Medications   Semaglutide,0.25 or 0.'5MG'$ /DOS, (OZEMPIC, 0.25 OR 0.5 MG/DOSE,) 2 MG/3ML SOPN   Other Relevant Orders   Microalbumin / creatinine urine ratio   Comprehensive  metabolic panel   Lipid panel     Other   Mixed hyperlipidemia    HLD PLAN: -Reviewed most recent lipid panel -Medication management: continue atorvastatin 40 mg daily -Repeat CMP and lipid panel today -Diet low in saturated fat -Regular exercise - at least 30 minutes, 5 times per week       Relevant Orders   Comprehensive metabolic panel   Lipid panel   History of vitamin D deficiency    Ordering daily supplementation  Checking labs today       Relevant Medications   Cholecalciferol (VITAMIN D3) 125 MCG (5000 UT) CAPS   Other Relevant Orders   VITAMIN D 25 Hydroxy (Vit-D Deficiency, Fractures)   Other Visit Diagnoses     Hypomagnesemia       Relevant Medications   magnesium oxide (MAG-OX) 400 (240 Mg) MG tablet   Other Relevant Orders   Magnesium       Return in about 6 months (around 09/12/2022) for routine f/u or CPE/pap.    Terrilyn Saver, NP

## 2022-03-12 NOTE — Assessment & Plan Note (Signed)
Blood pressure is at goal for age and co-morbidities.   Recommendations: continue amlodipine 5 mg daily, lisinopril 20 mg daily - BP goal <130/80 - monitor and log blood pressures at home - check around the same time each day in a relaxed setting - Limit salt to <2000 mg/day - Follow DASH eating plan (heart healthy diet) - limit alcohol to 2 standard drinks per day for men and 1 per day for women - avoid tobacco products - get at least 2 hours of regular aerobic exercise weekly Patient aware of signs/symptoms requiring further/urgent evaluation. Labs updated today.

## 2022-03-12 NOTE — Assessment & Plan Note (Signed)
Well controlled with last A1c 7.1% Continue current medications - metformin 1,000 mg BID, Ozempic 0.5 mg weekly UTD on vaccines, eye exam, foot exam On ACEi  On Statin  Discussed diet and exercise F/u in 6 months

## 2022-03-12 NOTE — Assessment & Plan Note (Signed)
HLD PLAN: -Reviewed most recent lipid panel -Medication management: continue atorvastatin 40 mg daily -Repeat CMP and lipid panel today -Diet low in saturated fat -Regular exercise - at least 30 minutes, 5 times per week

## 2022-03-12 NOTE — Assessment & Plan Note (Signed)
Ordering daily supplementation  Checking labs today

## 2022-03-13 ENCOUNTER — Encounter: Payer: Self-pay | Admitting: *Deleted

## 2022-03-13 LAB — COMPREHENSIVE METABOLIC PANEL
ALT: 11 U/L (ref 0–35)
AST: 12 U/L (ref 0–37)
Albumin: 4.4 g/dL (ref 3.5–5.2)
Alkaline Phosphatase: 61 U/L (ref 39–117)
BUN: 13 mg/dL (ref 6–23)
CO2: 31 mEq/L (ref 19–32)
Calcium: 9.3 mg/dL (ref 8.4–10.5)
Chloride: 102 mEq/L (ref 96–112)
Creatinine, Ser: 1.2 mg/dL (ref 0.40–1.20)
GFR: 50.76 mL/min — ABNORMAL LOW (ref >60.00)
Glucose, Bld: 236 mg/dL — ABNORMAL HIGH (ref 70–99)
Potassium: 4.2 mEq/L (ref 3.5–5.1)
Sodium: 142 mEq/L (ref 135–145)
Total Bilirubin: 0.4 mg/dL (ref 0.2–1.2)
Total Protein: 6.5 g/dL (ref 6.0–8.3)

## 2022-03-13 LAB — CBC
HCT: 32.7 % — ABNORMAL LOW (ref 36.0–46.0)
Hemoglobin: 10.9 g/dL — ABNORMAL LOW (ref 12.0–15.0)
MCHC: 33.3 g/dL (ref 30.0–36.0)
MCV: 89.6 fl (ref 78.0–100.0)
Platelets: 189 10*3/uL (ref 150.0–400.0)
RBC: 3.65 Mil/uL — ABNORMAL LOW (ref 3.87–5.11)
RDW: 13.6 % (ref 11.5–15.5)
WBC: 4.5 10*3/uL (ref 4.0–10.5)

## 2022-03-13 LAB — TSH: TSH: 0.33 u[IU]/mL — ABNORMAL LOW (ref 0.35–5.50)

## 2022-03-13 LAB — LIPID PANEL
Cholesterol: 119 mg/dL (ref 0–200)
HDL: 50.5 mg/dL (ref >39.00)
LDL Cholesterol: 54 mg/dL (ref 0–99)
NonHDL: 68.83
Total CHOL/HDL Ratio: 2
Triglycerides: 74 mg/dL (ref 0.0–149.0)
VLDL: 14.8 mg/dL (ref 0.0–40.0)

## 2022-03-13 LAB — VITAMIN D 25 HYDROXY (VIT D DEFICIENCY, FRACTURES): VITD: 36.8 ng/mL (ref 30.00–100.00)

## 2022-03-13 LAB — MAGNESIUM: Magnesium: 1.6 mg/dL (ref 1.5–2.5)

## 2022-03-13 NOTE — Progress Notes (Signed)
Labs are at baseline. No changes. Continue following with hematology for anemia, stable currently.   (History of subclinical hyperthyroidism - asymptomatic).

## 2022-03-24 ENCOUNTER — Encounter (INDEPENDENT_AMBULATORY_CARE_PROVIDER_SITE_OTHER): Payer: 59 | Admitting: Ophthalmology

## 2022-03-24 DIAGNOSIS — E113293 Type 2 diabetes mellitus with mild nonproliferative diabetic retinopathy without macular edema, bilateral: Secondary | ICD-10-CM

## 2022-03-24 DIAGNOSIS — I10 Essential (primary) hypertension: Secondary | ICD-10-CM

## 2022-03-24 DIAGNOSIS — H35033 Hypertensive retinopathy, bilateral: Secondary | ICD-10-CM

## 2022-03-24 DIAGNOSIS — H25813 Combined forms of age-related cataract, bilateral: Secondary | ICD-10-CM

## 2022-04-01 ENCOUNTER — Telehealth: Payer: Self-pay | Admitting: Family Medicine

## 2022-04-01 ENCOUNTER — Other Ambulatory Visit: Payer: Self-pay | Admitting: *Deleted

## 2022-04-01 DIAGNOSIS — E782 Mixed hyperlipidemia: Secondary | ICD-10-CM

## 2022-04-01 MED ORDER — ATORVASTATIN CALCIUM 40 MG PO TABS: 40.0000 mg | ORAL_TABLET | Freq: Every day | ORAL | 3 refills | Status: DC

## 2022-04-01 NOTE — Telephone Encounter (Signed)
Medication:   atorvastatin (LIPITOR) 40 MG tablet [[id number]]   Has the patient contacted their pharmacy? No. (If no, request that the patient contact the pharmacy for the refill.) (If yes, when and what did the pharmacy advise?)  Preferred Pharmacy (with phone number or street name):   [facility], [address] [place] [address]  Phone:  [phone]  Fax:  [phone]   Agent: Please be advised that RX refills may take up to 3 business days. We ask that you follow-up with your pharmacy.

## 2022-04-01 NOTE — Telephone Encounter (Signed)
Refill sent to pharmacy.   

## 2022-04-07 ENCOUNTER — Telehealth: Payer: Self-pay | Admitting: Family Medicine

## 2022-04-07 DIAGNOSIS — I10 Essential (primary) hypertension: Secondary | ICD-10-CM

## 2022-04-07 MED ORDER — LISINOPRIL 20 MG PO TABS: 20.0000 mg | ORAL_TABLET | Freq: Every day | ORAL | 3 refills | Status: AC

## 2022-04-07 NOTE — Telephone Encounter (Signed)
Medication:   lisinopril (PRINIVIL,ZESTRIL) 20 MG tablet [[id number]]   Has the patient contacted their pharmacy? No. (If no, request that the patient contact the pharmacy for the refill.) (If yes, when and what did the pharmacy advise?)  Preferred Pharmacy (with phone number or street name):   [facility], [place] [address]  Phone:  [phone]  Fax:  [phone]   Agent: Please be advised that RX refills may take up to 3 business days. We ask that you follow-up with your pharmacy.

## 2022-04-07 NOTE — Telephone Encounter (Signed)
Rx sent 

## 2022-04-28 ENCOUNTER — Inpatient Hospital Stay: Payer: Commercial Managed Care - HMO | Attending: Hematology & Oncology

## 2022-04-28 ENCOUNTER — Inpatient Hospital Stay: Payer: Commercial Managed Care - HMO | Admitting: Family

## 2022-05-05 ENCOUNTER — Encounter (INDEPENDENT_AMBULATORY_CARE_PROVIDER_SITE_OTHER): Payer: Commercial Managed Care - HMO | Admitting: Ophthalmology

## 2022-05-05 DIAGNOSIS — H25813 Combined forms of age-related cataract, bilateral: Secondary | ICD-10-CM

## 2022-05-05 DIAGNOSIS — I10 Essential (primary) hypertension: Secondary | ICD-10-CM

## 2022-05-05 DIAGNOSIS — H35033 Hypertensive retinopathy, bilateral: Secondary | ICD-10-CM

## 2022-05-05 DIAGNOSIS — E113293 Type 2 diabetes mellitus with mild nonproliferative diabetic retinopathy without macular edema, bilateral: Secondary | ICD-10-CM

## 2022-05-15 ENCOUNTER — Ambulatory Visit (INDEPENDENT_AMBULATORY_CARE_PROVIDER_SITE_OTHER): Payer: Commercial Managed Care - HMO | Admitting: Family Medicine

## 2022-05-15 ENCOUNTER — Encounter: Payer: Self-pay | Admitting: Family Medicine

## 2022-05-15 VITALS — BP 134/72 | HR 100 | Temp 98.4°F | Resp 18 | Ht 63.0 in | Wt 156.2 lb

## 2022-05-15 DIAGNOSIS — J208 Acute bronchitis due to other specified organisms: Secondary | ICD-10-CM

## 2022-05-15 DIAGNOSIS — R059 Cough, unspecified: Secondary | ICD-10-CM

## 2022-05-15 DIAGNOSIS — B9689 Other specified bacterial agents as the cause of diseases classified elsewhere: Secondary | ICD-10-CM

## 2022-05-15 LAB — POCT INFLUENZA A/B
Influenza A, POC: NEGATIVE
Influenza B, POC: NEGATIVE

## 2022-05-15 LAB — POC COVID19 BINAXNOW: SARS Coronavirus 2 Ag: NEGATIVE

## 2022-05-15 MED ORDER — FLUCONAZOLE 150 MG PO TABS: ORAL_TABLET | ORAL | 0 refills | Status: AC

## 2022-05-15 MED ORDER — AZITHROMYCIN 250 MG PO TABS: ORAL_TABLET | ORAL | 0 refills | Status: AC

## 2022-05-15 MED ORDER — PROMETHAZINE-DM 6.25-15 MG/5ML PO SYRP: 5.0000 mL | ORAL_SOLUTION | Freq: Four times a day (QID) | ORAL | 0 refills | Status: AC | PRN

## 2022-05-15 NOTE — Patient Instructions (Signed)
Continue to push fluids, practice good hand hygiene, and cover your mouth if you cough. ? ?If you start having fevers, shaking or shortness of breath, seek immediate care. ? ?OK to take Tylenol 1000 mg (2 extra strength tabs) or 975 mg (3 regular strength tabs) every 6 hours as needed. ? ?Let us know if you need anything. ?

## 2022-05-15 NOTE — Progress Notes (Signed)
Chief Complaint  Patient presents with   Cough    Onset: 2 weeks , colored mucus    Brandy Lynn here for URI complaints.  Duration: 2 weeks  Associated symptoms: sinus congestion, sinus pain, rhinorrhea, myalgia, and productive cough Denies: sinus pain, itchy watery eyes, ear pain, ear drainage, sore throat, wheezing, shortness of breath, and fevers Treatment to date: hot tea/lemon, robitussin Sick contacts: No Tested neg for covid.   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 NSAID and low-protein diet. Uric acid and CBC today.     Pyelonephritis    hx/notes 06/26/2015   Sepsis ([place]) hospitalized 06/26/2015   Sickle cell trait (HCC)    Type II diabetes mellitus ([place])     Objective BP 134/72 (BP Location: Left Arm, Cuff Size: Normal)   Pulse 100   Temp 98.4 F (36.9 C)   Resp 18   Ht '5\' 3"'$  (1.6 m)   Wt 156 lb 3.2 oz (70.9 kg)   SpO2 100%   BMI 27.67 kg/m  General: Awake, alert, appears stated age HEENT: AT, [place], ears patent b/l and TM's neg, nares patent w/o discharge, pharynx pink and without exudates, MMM Neck: No masses or asymmetry Heart: RRR Lungs: CTAB, no accessory muscle use Psych: Age appropriate judgment and insight, normal mood and affect  Acute bacterial bronchitis - Plan: azithromycin (ZITHROMAX) 250 MG tablet, promethazine-dextromethorphan (PROMETHAZINE-DM) 6.25-15 MG/5ML syrup, fluconazole (DIFLUCAN) 150 MG tablet  Cough, unspecified type - Plan: POC COVID-19, POCT Influenza A/B  Covid and flu testing neg. Zpak, cough medicine prn. Continue to push fluids, practice good hand hygiene, cover mouth when coughing. F/u prn. If starting to experience fevers, shaking, or shortness of breath, seek immediate care. Pt voiced  understanding and agreement to the plan.  [place], DO 05/15/22 2:08 PM

## 2022-05-17 ENCOUNTER — Other Ambulatory Visit: Payer: Self-pay | Admitting: Family Medicine

## 2022-05-17 DIAGNOSIS — E119 Type 2 diabetes mellitus without complications: Secondary | ICD-10-CM

## 2022-07-15 ENCOUNTER — Other Ambulatory Visit: Payer: Self-pay

## 2022-07-15 DIAGNOSIS — E119 Type 2 diabetes mellitus without complications: Secondary | ICD-10-CM

## 2022-07-15 MED ORDER — OZEMPIC (0.25 OR 0.5 MG/DOSE) 2 MG/3ML ~~LOC~~ SOPN: 0.5000 mg | PEN_INJECTOR | SUBCUTANEOUS | 1 refills | Status: DC

## 2022-07-17 ENCOUNTER — Other Ambulatory Visit: Payer: Self-pay

## 2022-07-18 DIAGNOSIS — Z419 Encounter for procedure for purposes other than remedying health state, unspecified: Secondary | ICD-10-CM | POA: Diagnosis not present

## 2022-07-21 ENCOUNTER — Telehealth: Payer: Self-pay | Admitting: Family Medicine

## 2022-07-21 NOTE — Telephone Encounter (Signed)
Patient states her pharmacy told her they no longer want to cover Ozempic and a new medication needs to be sent to replace it. Please advise.

## 2022-07-22 ENCOUNTER — Telehealth: Payer: Self-pay

## 2022-07-22 NOTE — Telephone Encounter (Signed)
Prior Auth. Started  Key: [id number] Awaiting response

## 2022-07-22 NOTE — Telephone Encounter (Signed)
Pt states she does not know and asked if we could figure it out. States she has been running back a forth between [place] and the pharmacy.

## 2022-07-22 NOTE — Telephone Encounter (Signed)
Called pt but she was driving to get info. Like [facility]. Pt said she will call back

## 2022-07-23 ENCOUNTER — Other Ambulatory Visit: Payer: Self-pay

## 2022-07-23 DIAGNOSIS — E119 Type 2 diabetes mellitus without complications: Secondary | ICD-10-CM

## 2022-07-23 DIAGNOSIS — E782 Mixed hyperlipidemia: Secondary | ICD-10-CM

## 2022-07-23 MED ORDER — ATORVASTATIN CALCIUM 40 MG PO TABS: 40.0000 mg | ORAL_TABLET | Freq: Every day | ORAL | 3 refills | Status: AC

## 2022-07-23 MED ORDER — TRULICITY 0.75 MG/0.5ML ~~LOC~~ SOAJ: 0.7500 mg | SUBCUTANEOUS | 0 refills | Status: AC

## 2022-07-23 NOTE — Telephone Encounter (Signed)
Ozempic(0.25&0.5) '2mg'$ /16m pen  Was denied and trulicity was sent in

## 2022-08-18 DIAGNOSIS — Z419 Encounter for procedure for purposes other than remedying health state, unspecified: Secondary | ICD-10-CM | POA: Diagnosis not present

## 2022-09-01 ENCOUNTER — Encounter: Payer: Medicaid Other | Admitting: Family Medicine

## 2022-09-01 ENCOUNTER — Telehealth: Payer: Self-pay

## 2022-09-01 NOTE — Telephone Encounter (Signed)
PA initiated via Coverm[employer]; KEY: [id number]. PA approved   Effective from 09/01/2022 through 08/31/2023.

## 2022-09-10 ENCOUNTER — Encounter: Payer: Self-pay | Admitting: Family Medicine

## 2022-09-10 ENCOUNTER — Telehealth: Payer: Self-pay

## 2022-09-10 ENCOUNTER — Ambulatory Visit (INDEPENDENT_AMBULATORY_CARE_PROVIDER_SITE_OTHER): Payer: BLUE CROSS/BLUE SHIELD | Admitting: Family Medicine

## 2022-09-10 VITALS — BP 135/77 | HR 88 | Temp 97.9°F | Resp 16 | Ht 64.0 in | Wt 163.2 lb

## 2022-09-10 DIAGNOSIS — Z8639 Personal history of other endocrine, nutritional and metabolic disease: Secondary | ICD-10-CM

## 2022-09-10 DIAGNOSIS — I10 Essential (primary) hypertension: Secondary | ICD-10-CM

## 2022-09-10 DIAGNOSIS — Z0000 Encounter for general adult medical examination without abnormal findings: Secondary | ICD-10-CM | POA: Diagnosis not present

## 2022-09-10 DIAGNOSIS — E782 Mixed hyperlipidemia: Secondary | ICD-10-CM

## 2022-09-10 DIAGNOSIS — K219 Gastro-esophageal reflux disease without esophagitis: Secondary | ICD-10-CM

## 2022-09-10 DIAGNOSIS — E119 Type 2 diabetes mellitus without complications: Secondary | ICD-10-CM

## 2022-09-10 LAB — LIPID PANEL
Cholesterol: 112 mg/dL (ref 0–200)
HDL: 54 mg/dL (ref >39.00)
LDL Cholesterol: 50 mg/dL (ref 0–99)
NonHDL: 58.22
Total CHOL/HDL Ratio: 2
Triglycerides: 40 mg/dL (ref 0.0–149.0)
VLDL: 8 mg/dL (ref 0.0–40.0)

## 2022-09-10 LAB — COMPREHENSIVE METABOLIC PANEL
ALT: 10 U/L (ref 0–35)
AST: 12 U/L (ref 0–37)
Albumin: 4.3 g/dL (ref 3.5–5.2)
Alkaline Phosphatase: 60 U/L (ref 39–117)
BUN: 20 mg/dL (ref 6–23)
CO2: 30 mEq/L (ref 19–32)
Calcium: 9.3 mg/dL (ref 8.4–10.5)
Chloride: 101 mEq/L (ref 96–112)
Creatinine, Ser: 1.19 mg/dL (ref 0.40–1.20)
GFR: 51.1 mL/min — ABNORMAL LOW (ref >60.00)
Glucose, Bld: 127 mg/dL — ABNORMAL HIGH (ref 70–99)
Potassium: 4.4 mEq/L (ref 3.5–5.1)
Sodium: 140 mEq/L (ref 135–145)
Total Bilirubin: 0.4 mg/dL (ref 0.2–1.2)
Total Protein: 6.6 g/dL (ref 6.0–8.3)

## 2022-09-10 LAB — CBC WITH DIFFERENTIAL/PLATELET
Basophils Absolute: 0 10*3/uL (ref 0.0–0.1)
Basophils Relative: 0.6 % (ref 0.0–3.0)
Eosinophils Absolute: 0.2 10*3/uL (ref 0.0–0.7)
Eosinophils Relative: 2.7 % (ref 0.0–5.0)
HCT: 31.6 % — ABNORMAL LOW (ref 36.0–46.0)
Hemoglobin: 10.8 g/dL — ABNORMAL LOW (ref 12.0–15.0)
Lymphocytes Relative: 24 % (ref 12.0–46.0)
Lymphs Abs: 1.4 10*3/uL (ref 0.7–4.0)
MCHC: 34.3 g/dL (ref 30.0–36.0)
MCV: 89 fl (ref 78.0–100.0)
Monocytes Absolute: 0.4 10*3/uL (ref 0.1–1.0)
Monocytes Relative: 6.8 % (ref 3.0–12.0)
Neutro Abs: 4 10*3/uL (ref 1.4–7.7)
Neutrophils Relative %: 65.9 % (ref 43.0–77.0)
Platelets: 195 10*3/uL (ref 150.0–400.0)
RBC: 3.55 Mil/uL — ABNORMAL LOW (ref 3.87–5.11)
RDW: 13.4 % (ref 11.5–15.5)
WBC: 6 10*3/uL (ref 4.0–10.5)

## 2022-09-10 LAB — MAGNESIUM: Magnesium: 1.7 mg/dL (ref 1.5–2.5)

## 2022-09-10 LAB — VITAMIN D 25 HYDROXY (VIT D DEFICIENCY, FRACTURES): VITD: 49.21 ng/mL (ref 30.00–100.00)

## 2022-09-10 LAB — HEMOGLOBIN A1C: Hgb A1c MFr Bld: 7.5 % — ABNORMAL HIGH (ref 4.6–6.5)

## 2022-09-10 MED ORDER — MAGNESIUM OXIDE -MG SUPPLEMENT 400 (240 MG) MG PO TABS: 400.0000 mg | ORAL_TABLET | Freq: Every day | ORAL | 1 refills | Status: AC

## 2022-09-10 MED ORDER — PANTOPRAZOLE SODIUM 40 MG PO TBEC: 40.0000 mg | DELAYED_RELEASE_TABLET | Freq: Every day | ORAL | 3 refills | Status: AC

## 2022-09-10 MED ORDER — AMLODIPINE BESYLATE 5 MG PO TABS: 5.0000 mg | ORAL_TABLET | Freq: Every day | ORAL | 1 refills | Status: DC

## 2022-09-10 NOTE — Telephone Encounter (Signed)
PA initiated via Covermymeds; KEY: [id number]. Awaiting determination.
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
## 2022-09-11 NOTE — Telephone Encounter (Signed)
PA approved. Effective from 09/10/2022 through 09/09/2023.

## 2022-09-18 DIAGNOSIS — Z419 Encounter for procedure for purposes other than remedying health state, unspecified: Secondary | ICD-10-CM | POA: Diagnosis not present

## 2022-10-17 DIAGNOSIS — Z419 Encounter for procedure for purposes other than remedying health state, unspecified: Secondary | ICD-10-CM | POA: Diagnosis not present

## 2022-11-17 DIAGNOSIS — Z419 Encounter for procedure for purposes other than remedying health state, unspecified: Secondary | ICD-10-CM | POA: Diagnosis not present

## 2022-11-25 ENCOUNTER — Other Ambulatory Visit: Payer: Self-pay | Admitting: Family Medicine

## 2022-11-25 DIAGNOSIS — E119 Type 2 diabetes mellitus without complications: Secondary | ICD-10-CM

## 2022-12-17 DIAGNOSIS — Z419 Encounter for procedure for purposes other than remedying health state, unspecified: Secondary | ICD-10-CM | POA: Diagnosis not present

## 2022-12-22 DIAGNOSIS — H11153 Pinguecula, bilateral: Secondary | ICD-10-CM | POA: Diagnosis not present

## 2022-12-22 DIAGNOSIS — H5213 Myopia, bilateral: Secondary | ICD-10-CM | POA: Diagnosis not present

## 2022-12-22 DIAGNOSIS — H35011 Changes in retinal vascular appearance, right eye: Secondary | ICD-10-CM | POA: Diagnosis not present

## 2022-12-22 DIAGNOSIS — H31001 Unspecified chorioretinal scars, right eye: Secondary | ICD-10-CM | POA: Diagnosis not present

## 2022-12-22 DIAGNOSIS — H35363 Drusen (degenerative) of macula, bilateral: Secondary | ICD-10-CM | POA: Diagnosis not present

## 2022-12-22 DIAGNOSIS — Z7984 Long term (current) use of oral hypoglycemic drugs: Secondary | ICD-10-CM | POA: Diagnosis not present

## 2022-12-22 DIAGNOSIS — E113293 Type 2 diabetes mellitus with mild nonproliferative diabetic retinopathy without macular edema, bilateral: Secondary | ICD-10-CM | POA: Diagnosis not present

## 2022-12-22 DIAGNOSIS — H35042 Retinal micro-aneurysms, unspecified, left eye: Secondary | ICD-10-CM | POA: Diagnosis not present

## 2022-12-22 DIAGNOSIS — H524 Presbyopia: Secondary | ICD-10-CM | POA: Diagnosis not present

## 2022-12-22 DIAGNOSIS — H35033 Hypertensive retinopathy, bilateral: Secondary | ICD-10-CM | POA: Diagnosis not present

## 2023-01-17 DIAGNOSIS — Z419 Encounter for procedure for purposes other than remedying health state, unspecified: Secondary | ICD-10-CM | POA: Diagnosis not present

## 2023-01-30 ENCOUNTER — Telehealth: Payer: Self-pay

## 2023-01-30 NOTE — Transitions of Care (Post Inpatient/ED Visit) (Unsigned)
   01/30/2023  Name: Brandy Lynn MRN: [id number] DOB: 04-26-1966  Today's TOC FU Call Status: Today's TOC FU Call Status:: Unsuccessul Call (1st Attempt) Unsuccessful Call (1st Attempt) Date: 01/30/23  Attempted to reach the patient regarding the most recent Inpatient/ED visit.  Follow Up Plan: Additional outreach attempts will be made to reach the patient to complete the Transitions of Care (Post Inpatient/ED visit) call.   Signature Karena Addison, LPN [facility] Nurse Health Advisor Direct Dial [phone]

## 2023-02-02 NOTE — Transitions of Care (Post Inpatient/ED Visit) (Signed)
02/02/2023  Name: Brandy Lynn MRN: [id number] DOB: [birth year]  Today's TOC FU Call Status: Today's TOC FU Call Status:: Successful TOC FU Call Competed Unsuccessful Call (1st Attempt) Date: 01/30/23 [facility] FU Call Complete Date: 02/02/23  Transition Care Management Follow-up Telephone Call Date of Discharge: 01/29/23 Discharge Facility: [facility] Type of Discharge: Inpatient Admission Primary Inpatient Discharge Diagnosis:: pain How have you been since you were released from the hospital?: Better Any questions or concerns?: No  Items Reviewed: Did you receive and understand the discharge instructions provided?: Yes Medications obtained,verified, and reconciled?: Yes (Medications Reviewed) Any new allergies since your discharge?: No Dietary orders reviewed?: Yes Do you have support at home?: Yes People in Home: spouse  Medications Reviewed Today: Medications Reviewed Today     Reviewed by Clayborne Dana, NP (Nurse Practitioner) on 09/10/22 at [phone]  Med List Status: <None>   Medication Order Taking? Sig Documenting Provider Last Dose Status Informant  amLODipine (NORVASC) 5 MG tablet [id number]  Take 1 tablet (5 mg total) by mouth daily. Clayborne Dana, NP  Active   aspirin EC 81 MG tablet [id number] Yes Take 81 mg by mouth daily. [provider] Taking Active Self  atorvastatin (LIPITOR) 40 MG tablet [id number] Yes Take 1 tablet (40 mg total) by mouth daily. Clayborne Dana, NP Taking Active   azithromycin (ZITHROMAX) 250 MG tablet [id number] Yes Take 2 tabs the first day and then 1 tab daily until you run out. Sharlene Dory, DO Taking Active   Cholecalciferol (VITAMIN D3) 125 MCG (5000 UT) CAPS [id number] Yes Take 1 capsule (5,000 Units total) by mouth daily. Clayborne Dana, NP Taking Active   Dulaglutide (TRULICITY) 0.75 MG/0.5ML Namon Cirri [id number] Yes Inject 0.75 mg into the skin once a week. Clayborne Dana, NP Taking Active   fluconazole (DIFLUCAN) 150  MG tablet [id number] Yes Take 1 tab, repeat in 72 hours if no improvement. Sharlene Dory, DO Taking Active   folic acid (FOLVITE) 1 MG tablet [id number] Yes Take 1 tablet (1 mg total) by mouth daily. Erenest Blank, NP Taking Active   lisinopril (ZESTRIL) 20 MG tablet [id number] Yes Take 1 tablet (20 mg total) by mouth daily. Clayborne Dana, NP Taking Active   magnesium oxide (MAG-OX) 400 (240 Mg) MG tablet [id number]  Take 1 tablet (400 mg total) by mouth daily. Clayborne Dana, NP  Active   metFORMIN (GLUCOPHAGE) 1000 MG tablet [id number] Yes Take 1 tablet (1,000 mg total) by mouth 2 (two) times daily with a meal. Clayborne Dana, NP Taking Active   ondansetron (ZOFRAN ODT) 8 MG disintegrating tablet [id number] Yes Take 1 tablet (8 mg total) by mouth every 8 (eight) hours as needed for nausea or vomiting. Molpus, John, MD Taking Active   pantoprazole (PROTONIX) 40 MG tablet [id number] Yes Take 1 tablet (40 mg total) by mouth daily. Clayborne Dana, NP  Active   promethazine-dextromethorphan (PROMETHAZINE-DM) 6.25-15 MG/5ML syrup [id number] Yes Take 5 mLs by mouth 4 (four) times daily as needed for cough. Sharlene Dory, DO Taking Active   tobramycin (TOBREX) 0.3 % ophthalmic solution [id number] Yes Place 2 drops into both eyes every 6 (six) hours. Sharlene Dory, DO Taking Active             Home Care and Equipment/Supplies: Were Home Health Services Ordered?: NA Any new equipment or medical supplies ordered?: NA  Functional Questionnaire: Do you need assistance with bathing/showering or dressing?: No Do you need  assistance with meal preparation?: No Do you need assistance with eating?: No Do you have difficulty maintaining continence: No Do you need assistance with getting out of bed/getting out of a chair/moving?: No Do you have difficulty managing or taking your medications?: No  Follow up appointments reviewed: PCP Follow-up appointment confirmed?: No (transferred  [facility]) MD Provider Line Number:[phone] Given: No Specialist Hospital Follow-up appointment confirmed?: NA Do you need transportation to your follow-up appointment?: No Do you understand care options if your condition(s) worsen?: Yes-patient verbalized understanding    SIGNATURE Karena Addison, LPN [facility] Nurse Health Advisor Direct Dial [phone]

## 2023-02-05 ENCOUNTER — Other Ambulatory Visit: Payer: Self-pay | Admitting: Family Medicine

## 2023-02-05 DIAGNOSIS — E119 Type 2 diabetes mellitus without complications: Secondary | ICD-10-CM

## 2023-02-05 DIAGNOSIS — I10 Essential (primary) hypertension: Secondary | ICD-10-CM

## 2023-04-08 NOTE — Progress Notes (Signed)
 [facility]  - [facility]  [address] [place] [address]                                      04/08/23 Patient name: Brandy Lynn  Date of birth: Oct 28, 1965   SUBJECTIVE:  Chief Complaint  Patient presents with  . UTI symptoms    Brandy Lynn is a 57 y.o. female comes in today with complaints of R lower back pain, and cloudy urine, urinary urgency x 2 days.  She denies dysuria, urinary frequency, hematuria.  Reports she had fever and chills last night.  She did not check her temperature.  She was nauseated yesterday.  She denies nausea and vomiting today.  Pt also complains of HA on top of her head x 2days.  She is taking Tylenol  and it helps the pain.  BP Readings from Last 3 Encounters:  04/08/23 114/72  03/16/23 135/82  03/02/23 120/60    Lab Results  Component Value Date   HGBA1C 7.7 (H) 01/28/2023   HGBA1C 8.1 (H) 01/27/2023   HGBA1C 7.6 (H) 05/30/2021    Lab Results  Component Value Date   LDLCALC 50 01/27/2023    Lab Results  Component Value Date   CHOL 115 01/27/2023   HDL 52 (L) 01/27/2023   LDLCALC 50 01/27/2023   TRIG 58 01/27/2023      The ASCVD Risk score (Arnett DK, et al., 2019) failed to calculate for the following reasons:   The valid total cholesterol range is 130 to 320 mg/dL  HISTORY: Mechele reviewed the allergies, current medications, past medical and surgical history, family and social history, problem list, and updated as needed.  Allergies Allergies  Allergen Reactions  . Ferumoxytol Angioedema  . Allopurinol Analogues Other (See Comments)    Neutropenia  . Dulaglutide  Other (See Comments)    Headache  . Duloxetine Other (See Comments)    Constipation   . Ozempic  [Semaglutide ] GI Intolerance  . Shellfish Containing Products Itching    Medications Current Outpatient Medications  Medication Sig Dispense Refill  . amLODIPine  (NORVASC ) 5 mg tablet Take 5 mg by mouth  Once Daily. 90 tablet 3  . aspirin  81 mg EC tablet Take 81 mg by mouth Once Daily.    . atorvastatin  (LIPITOR) 40 mg tablet Take 1 tablet by mouth nightly 90 tablet 0  . glimepiride (AMARYL) 1 mg tablet Take 1 tablet (1 mg total) by mouth every morning before breakfast. 90 tablet 1  . lisinopriL  (PRINIVIL ) 20 mg tablet Take 20 mg by mouth every morning. 90 tablet 0  . magnesium  oxide 400 mg (241 mg magnesium ) tab Take 1 tablet by mouth once daily 90 tablet 0  . metFORMIN  (GLUCOPHAGE ) 1,000 mg tablet Take 1,000 mg by mouth 2 (two) times a day with meals. 180 tablet 2  . acetaminophen  (TYLENOL ) 325 mg tablet Take 650 mg by mouth every 6 (six) hours as needed.    . gabapentin (NEURONTIN) 300 mg capsule Take 1 capsule (300 mg total) by mouth nightly. (Patient not taking: Reported on 04/08/2023) 30 capsule 2  . meloxicam (MOBIC) 7.5 mg tablet Take 1 tablet (7.5 mg total) by mouth daily. (Patient not taking: Reported on 03/02/2023) 30 tablet 0  . omeprazole  (PriLOSEC) 40 mg DR capsule Take 40 mg by mouth daily as needed.    . omeprazole  (PriLOSEC) 40  mg DR capsule Take 1 capsule (40 mg total) by mouth in the morning. 60 capsule 0  . ondansetron  (ZOFRAN ) 4 mg tablet Take 1 tablet (4 mg total) by mouth every 8 (eight) hours as needed for nausea or vomiting. 20 tablet 1   No current facility-administered medications for this visit.    Family history Family History  Problem Relation Name Age of Onset  . Breast cancer Sister    . Leukemia Brother    . Blindness Neg Hx    . Glaucoma Neg Hx    . Macular degeneration Neg Hx      Social history Social History   Tobacco Use  Smoking Status Never  Smokeless Tobacco Never    Surgical history Past Surgical History:  Procedure Laterality Date  . TUBAL LIGATION     Procedure: TUBAL LIGATION    Preventive Care Health Maintenance  Topic Date Due  . SDOH Assessment  Never done  . HIV Screening  Never done  . Hepatitis C Screening  Never done  .  Hepatitis B Vaccines (1 of 3 - 19+ 3-dose series) Never done  . ZOSTER VACCINE (1 of 2) Never done  . Pneumococcal Vaccine: Pediatrics (0 to 5 years) and At-Risk Patients (6-64 Years) (2 of 2 - PCV) 01/10/2015  . Colorectal Cancer Screening  12/04/2018  . Breast Cancer Screening (Mammogram)  12/31/2022  . Influenza Vaccine (1) 03/19/2023  . COVID-19 Vaccine (4 - 2023-24 season) 01/26/2024 (Originally 04/18/2022)  . Diabetes: Foot Exam  09/24/2023  . Comprehensive Annual Visit  10/23/2023  . Diabetes: Retinopathy Screening Combo  12/22/2023  . Diabetes:  uACR for Kidney Evaluation  01/27/2024  . Diabetes: Hemoglobin A1C  01/28/2024  . Depression Screening  03/01/2024  . Diabetes:  eGFR for Kidney Evaluation  04/07/2024  . DTaP/Tdap/Td Vaccines (2 - Td or Tdap) 06/14/2024  . Cervical Cancer Screening  10/23/2027  . HIB Vaccines  Aged Out  . IPV Vaccines  Aged Out  . Hepatitis A Vaccines  Aged Out  . Meningococcal Conjugate (ACWY) Vaccine  Aged Out  . Rotavirus Vaccines  Aged Out  . HPV Vaccines  Aged Out  . Diabetes Screening  Discontinued     Immunization History  Administered Date(s) Administered  . Influenza, Injectable, Quadrivalent, Preservative Free 05/28/2021  . Influenza, Unspecified 06/06/2008, 05/28/2010, 05/18/2013  . Pfizer SARS-CoV-2 Primary Series 12+ yrs 11/06/2019, 11/27/2019, 09/14/2020  . Pneumococcal Polysaccharide 01/09/2014  . Tdap 06/14/2014     ROS  Review of Systems  Constitutional:  Positive for chills and fatigue. Negative for appetite change and fever.  Respiratory:  Negative for chest tightness, shortness of breath and wheezing.   Cardiovascular:  Negative for chest pain and palpitations.  Gastrointestinal:  Negative for abdominal pain, blood in stool, constipation, diarrhea, nausea and vomiting.  Genitourinary:  Positive for flank pain, pelvic pain and urgency. Negative for difficulty urinating, dysuria, frequency and hematuria.  Skin:  Negative for  rash and wound.  Neurological:  Negative for dizziness, seizures, syncope and headaches.  Psychiatric/Behavioral:  Negative for agitation, behavioral problems, confusion, self-injury and suicidal ideas.   All other systems reviewed and are negative.   Review of Systems - All other systems reviewed are negative except as noted above.  OBJECTIVE  BP 114/72 (BP Location: Right arm, Patient Position: Sitting)   Pulse 104   Temp 98.6 F (37 C) (Oral)   Ht 1.626 m (5' 4)   Wt 73.9 kg (163 lb)   SpO2 98%  BMI 27.98 kg/m    PHYSICAL:  Physical Exam Vitals and nursing note reviewed.  Constitutional:      General: She is not in acute distress.    Appearance: Normal appearance.  HENT:     Head: Normocephalic and atraumatic.  Eyes:     Pupils: Pupils are equal, round, and reactive to light.  Cardiovascular:     Rate and Rhythm: Normal rate and regular rhythm.     Heart sounds: No murmur heard.    No friction rub. No gallop.  Pulmonary:     Effort: Pulmonary effort is normal. No respiratory distress.     Breath sounds: No wheezing, rhonchi or rales.  Abdominal:     General: Bowel sounds are normal.     Palpations: Abdomen is soft.     Tenderness: There is no right CVA tenderness, left CVA tenderness, guarding or rebound.     Comments: Mild pelvic tenderness.  No guarding or rebound.  CVA tenderness negative bilateral.  Patient complains tenderness in bilateral lower back, close to the hips.  Musculoskeletal:     Cervical back: Normal range of motion and neck supple.     Right lower leg: No edema.     Left lower leg: No edema.  Skin:    General: Skin is warm.     Findings: No rash.  Neurological:     General: No focal deficit present.     Mental Status: She is alert and oriented to person, place, and time.  Psychiatric:        Mood and Affect: Mood normal.        Behavior: Behavior normal.     ASSESSMENT/PLAN:  1. Cloudy urine - Urinalysis with Reflex to  Microscopic; Future - Urine Culture; Future  2. Right flank pain - Urinalysis with Reflex to Microscopic; Future - Urine Culture; Future - CBC with Differential; Future  3. Stage 3a chronic kidney disease (CMS/HCC) - Basic Metabolic Panel; Future  Goals of care discussed with patient including medication compliance and adequate follow up. Patient verbalizesunderstanding and in agreement with the above plan. All questions answered.   I agree the documentation is accurate and complete.   Future Appointments  Date Time Provider Department Center  05/06/2023  8:00 AM Mal Robynn [place], PT [facility] OPPT EL [facility] [address]  05/28/2023 12:00 PM Talitha Flatten, MD [facility] PRE [facility]  06/02/2023  8:00 AM Clem JINNY Sero, MD [facility] ENDO QK [facility] [address]  07/07/2023  1:50 PM Comer Pillow Tsamis, MD [facility] OPH [facility] [address]   This document serves as a record of services personally performed by Dr. Flatten.  It was created on their behalf by Ronal LITTIE Ring, CMA, a trained medical scribe, and Certified Medical Assistant (CMA). During the course of documenting the history, physical exam and medical decision making, I was functioning as a Stage manager. The creation of this record is the provider's dictation and/or activities during the visit.     This document was created using the aidof voice recognition Scientist, clinical (histocompatibility and immunogenetics).

## 2023-05-28 NOTE — Progress Notes (Signed)
 [facility]  - [facility]  [address] [place] [address]                                      05/28/23 Patient name: Brandy Lynn  Date of birth: 06-Sep-1965   SUBJECTIVE:  Chief Complaint  Patient presents with  . 4 month follow up HTN, Lipids, DM    Brandy Lynn is a 57 y.o. female comes in today for medication management of HTN, Lipids, DM  Hypertension : patient is taking blood pressure medications as prescribed. No side effects noted of medications. Patient is not exercising daily and doing well. Patient is compliant with low salt diet. Patient is not checking blood pressure regularly. Patient does has a BP cuff at home.    Hyperlipidemia: Patient takes medication as directed, no side effects.  Compliant with low fat diet : Yes   DM: Reports fasting sugars: Does not check. Postprandial sugars: Does not check. Patient is adherent with medications. Compliant with low carbs diet: yes, exercise: No  Brandy Lynn reports no symptoms of hypoglycemia.   Vision change?  no Neuropathy of numbness or tingling? no Last DM eye exam: Due 12/22/2023   Patient would like to restart Ozempic .  Had a GI intolerance before, would like to try again.  She still has Ozempic  at home, she will start it this week.  She denies any fever, chills, dizziness, headache, blurred vision. No SOB, chest pain, palpitation. No nausea, vomiting, or abdominal pain.  BP Readings from Last 3 Encounters:  05/28/23 150/69  04/08/23 114/72  03/16/23 135/82    Lab Results  Component Value Date   HGBA1C 9.0 (H) 05/28/2023   HGBA1C 7.7 (H) 01/28/2023   HGBA1C 8.1 (H) 01/27/2023    Lab Results  Component Value Date   LDLCALC 53 05/28/2023   LDLCALC 50 01/27/2023    Lab Results  Component Value Date   CHOL 132 05/28/2023   HDL 59 (L) 05/28/2023   LDLCALC 53 05/28/2023   TRIG 122 05/28/2023      The 10-year ASCVD risk score  (Arnett DK, et al., 2019) is: 13.9%   Values used to calculate the score:     Age: 3 years     Sex: Female     Is Non-Hispanic African American: Yes     Diabetic: Yes     Tobacco smoker: No     Systolic Blood Pressure: 150 mmHg     Is BP treated: Yes     HDL Cholesterol: 59 mg/dL     Total Cholesterol: 132 mg/dL  HISTORY: Ihave reviewed the allergies, current medications, past medical and surgical history, family and social history, problem list, and updated as needed.  Allergies Allergies  Allergen Reactions  . Ferumoxytol Angioedema  . Allopurinol Analogues Other (See Comments)    Neutropenia  . Dulaglutide  Other (See Comments)    Headache  . Duloxetine Other (See Comments)    Constipation   . Ozempic  [Semaglutide ] GI Intolerance  . Shellfish Containing Products Itching    Medications Current Outpatient Medications  Medication Sig Dispense Refill  . acetaminophen  (TYLENOL ) 325 mg tablet Take 650 mg by mouth every 6 (six) hours as needed.    . aspirin  81 mg EC tablet Take 81 mg by mouth Once Daily.    Brandy Lynn glimepiride (AMARYL) 1 mg tablet Take 1 tablet (  1 mg total) by mouth every morning before breakfast. 90 tablet 1  . magnesium  oxide 400 mg (241 mg magnesium ) tab Take 1 tablet by mouth once daily 90 tablet 0  . metFORMIN  (GLUCOPHAGE ) 500 mg tablet Take 1 tablet (500 mg total) by mouth in the morning and 1 tablet (500 mg total) in the evening. Take with meals. 180 tablet 3  . omeprazole  (PriLOSEC) 40 mg DR capsule Take 1 capsule (40 mg total) by mouth in the morning. 60 capsule 0  . ondansetron  (ZOFRAN ) 4 mg tablet Take 1 tablet (4 mg total) by mouth every 8 (eight) hours as needed for nausea or vomiting. 20 tablet 1  . amLODIPine  (NORVASC ) 5 mg tablet Take 1 tablet (5 mg total) by mouth daily. 90 tablet 1  . atorvastatin  (LIPITOR) 40 mg tablet Take 1 tablet (40 mg total) by mouth nightly. 90 tablet 1  . gabapentin (NEURONTIN) 300 mg capsule Take 1 capsule (300 mg total) by  mouth nightly. (Patient not taking: Reported on 04/08/2023) 30 capsule 2  . lisinopriL  (PRINIVIL ) 20 mg tablet Take 1 tablet (20 mg total) by mouth every morning. 90 tablet 1  . omeprazole  (PriLOSEC) 40 mg DR capsule Take 40 mg by mouth daily as needed.    Brandy Lynn CASTLEMAN ON 06/28/2023] semaglutide  (Ozempic ) 0.25 mg or 0.5 mg (2 mg/3 mL) pen injector Inject 0.5 mg under the skin once a week. 9 mL 1   No current facility-administered medications for this visit.    Family history Family History  Problem Relation Name Age of Onset  . Breast cancer Sister    . Leukemia Brother    . Blindness Neg Hx    . Glaucoma Neg Hx    . Macular degeneration Neg Hx      Social history Social History   Tobacco Use  Smoking Status Never  Smokeless Tobacco Never    Surgical history Past Surgical History:  Procedure Laterality Date  . TUBAL LIGATION     Procedure: TUBAL LIGATION    Preventive Care Health Maintenance  Topic Date Due  . HIV Screening  Never done  . Hepatitis C Screening  Never done  . Hepatitis B Vaccines (1 of 3 - 19+ 3-dose series) Never done  . Pneumococcal Vaccine: Pediatrics (0 to 5 years) and At-Risk Patients (6-64 Years) (2 of 2 - PCV) 01/10/2015  . Colorectal Cancer Screening  12/04/2018  . Breast Cancer Screening (Mammogram)  12/31/2022  . ZOSTER VACCINE (1 of 2) 05/28/2023 (Originally 02/18/1985)  . COVID-19 Vaccine (4 - 2023-24 season) 07/29/2023 (Originally 04/19/2023)  . Influenza Vaccine (1) 02/15/2024 (Originally 03/19/2023)  . Diabetes: Hemoglobin A1C  08/28/2023  . Diabetes: Foot Exam  09/24/2023  . Comprehensive Annual Visit  10/23/2023  . Diabetes: Retinopathy Screening Combo  12/22/2023  . Diabetes:  uACR for Kidney Evaluation  01/27/2024  . Depression Screening  03/01/2024  . Diabetes:  eGFR for Kidney Evaluation  05/27/2024  . DTaP/Tdap/Td Vaccines (2 - Td or Tdap) 06/14/2024  . Cervical Cancer Screening  10/23/2027  . HIB Vaccines  Aged Out  . IPV Vaccines   Aged Out  . Hepatitis A Vaccines  Aged Out  . Meningococcal Conjugate (ACWY) Vaccine  Aged Out  . Rotavirus Vaccines  Aged Out  . HPV Vaccines  Aged Out  . Diabetes Screening  Discontinued     Immunization History  Administered Date(s) Administered  . Influenza, Injectable, Quadrivalent, Preservative Free 05/28/2021  . Influenza, Unspecified 06/06/2008, 05/28/2010, 05/18/2013  .  Pfizer SARS-CoV-2 Primary Series 12+ yrs 11/06/2019, 11/27/2019, 09/14/2020  . Pneumococcal Polysaccharide 01/09/2014  . Tdap 06/14/2014     ROS  Review of Systems  Constitutional:  Negative for appetite change, chills and fever.  Eyes:  Negative for visual disturbance.  Respiratory:  Negative for chest tightness, shortness of breath and wheezing.   Cardiovascular:  Negative for chest pain and palpitations.  Gastrointestinal:  Negative for abdominal pain, blood in stool, diarrhea, nausea and vomiting.  Genitourinary:  Negative for difficulty urinating, dysuria, flank pain and hematuria.  Skin:  Negative for rash and wound.  Neurological:  Positive for numbness. Negative for dizziness, tremors, seizures, syncope and headaches.       Finger numbness.  Psychiatric/Behavioral:  Negative for agitation, behavioral problems, confusion, self-injury and suicidal ideas.   All other systems reviewed and are negative.   Review of Systems - All other systems reviewed are negative except as noted above.  OBJECTIVE  BP 150/69 (BP Location: Right arm)   Pulse 83   Ht 1.626 m (5' 4)   Wt 76.2 kg (168 lb)   SpO2 99%   BMI 28.84 kg/m    PHYSICAL:  Physical Exam Vitals and nursing note reviewed.  Constitutional:      General: She is not in acute distress.    Appearance: Normal appearance.  HENT:     Head: Normocephalic and atraumatic.  Eyes:     Pupils: Pupils are equal, round, and reactive to light.  Neck:     Vascular: No carotid bruit.  Cardiovascular:     Rate and Rhythm: Normal rate and regular  rhythm.     Heart sounds: No murmur heard.    No friction rub. No gallop.  Pulmonary:     Effort: Pulmonary effort is normal. No respiratory distress.     Breath sounds: No wheezing, rhonchi or rales.  Abdominal:     General: Bowel sounds are normal.     Palpations: Abdomen is soft.     Tenderness: There is no abdominal tenderness.  Musculoskeletal:     Cervical back: Normal range of motion and neck supple.     Right lower leg: No edema.     Left lower leg: No edema.  Skin:    General: Skin is warm.     Findings: No rash.  Neurological:     General: No focal deficit present.     Mental Status: She is alert and oriented to person, place, and time.  Psychiatric:        Mood and Affect: Mood normal.        Behavior: Behavior normal.     ASSESSMENT/PLAN:  1. Hyperthyroidism, subclinical Lab Results  Component Value Date   TSH 0.437 (L) 01/27/2023  Repeat TSH  2. Type 2 diabetes mellitus with hyperglycemia, without long-term current use of insulin  (HCC) Lab Results  Component Value Date   HGBA1C 9.0 (H) 05/28/2023  Continue metformin  and glimepiride, restart Ozempic . - Hemoglobin A1C With Estimated Average Glucose; Future - semaglutide  (Ozempic ) 0.25 mg or 0.5 mg (2 mg/3 mL) pen injector; Inject 0.5 mg under the skin once a week.  Dispense: 9 mL; Refill: 1  3. Benign essential hypertension BP Readings from Last 3 Encounters:  05/28/23 150/69  04/08/23 114/72  03/16/23 135/82  BP above goal, advised patient to monitor BP at home and keep the log, continue amlodipine  and lisinopril  for now. - amLODIPine  (NORVASC ) 5 mg tablet; Take 1 tablet (5 mg total) by mouth daily.  Dispense: 90 tablet; Refill: 1 - lisinopriL  (PRINIVIL ) 20 mg tablet; Take 1 tablet (20 mg total) by mouth every morning.  Dispense: 90 tablet; Refill: 1 - Basic Metabolic Panel; Future  4. Stage 3a chronic kidney disease (HCC) Lab Results  Component Value Date   CREATININE 1.48 (H) 05/28/2023   BUN 19  05/28/2023   NA 134 (L) 05/28/2023   K 4.5 05/28/2023   CL 98 05/28/2023   CO2 29 05/28/2023  Reports she is not taking any NSAIDs.   Stay hydrated, avoid nephrotoxin, follow-up with nephrology. - Basic Metabolic Panel; Future  5. Mixed hyperlipidemia Lab Results  Component Value Date   LDLCALC 53 05/28/2023  Continue atorvastatin . - atorvastatin  (LIPITOR) 40 mg tablet; Take 1 tablet (40 mg total) by mouth nightly.  Dispense: 90 tablet; Refill: 1  6. On long term drug therapy - Lipid Panel; Future  7. Cervical radiculopathy 8. Lumbar radiculopathy Finish the PT, symptoms improved.  Continue home exercise.    Return in about 3 months (around 08/28/2023) for big 3.  Goals of care discussed with patient including medication compliance and adequate follow up. Patient verbalizesunderstanding and in agreement with the above plan. All questions answered.   I agree the documentation is accurate and complete.   Future Appointments  Date Time Provider Department Center  07/07/2023  1:50 PM Comer Pillow Tsamis, MD [facility] [address]  09/28/2023 12:00 PM Talitha Flatten, MD [facility] PRE [facility]   This document serves as a record of services personally performed by Dr. Flatten.  It was created on their behalf by Ronal LITTIE Ring, CMA, a trained medical scribe, and Certified Medical Assistant (CMA). During the course of documenting the history, physical exam and medical decision making, I was functioning as a Stage manager. The creation of this record is the provider's dictation and/or activities during the visit.     This document was created using the aidof voice recognition Scientist, clinical (histocompatibility and immunogenetics).

## 2023-10-13 NOTE — Progress Notes (Signed)
 Adult Endocrinology Clinic Note   Referring Provider: Cesario Mutton, MD  SUBJECTIVE:  This is a 58 y.o. female who presents for an initial consultation visit for diabetes mellitus type 2 ,last A1c 12.6% (09/28/23)  Diabetes History: Diagnosed in 2002 GDM. Started treatment with Orals . Started insulin  in 1 week ago   A1c: Lab Results  Component Value Date   HGBA1C 12.6 (H) 09/28/2023    Current Medication Regimen:  Glimepiride 1 mg Lantus  10 units once a day Metformin  500 mg 1 tab BID **Ozempic  0.5 mg weekly (stopped due to SE prior to visit with PCP)** Atorvastatin  40 mg Lisinopril  20 mg  Adherence: TBD  Blood Glucose Monitoring: Does not check BG  Hypoglycemia:  Has awareness  Meal Plan: Patient eats 2 meals per day.  Breakfast: Oatmeal Lunch:  Dinner: Chicken, vegetables, rice Snacks Apple Beverages: Water  Exercise: seasonal walking  Health Maintenance/Complications: Microvascular Complications:  Retinopathy: Last eye exam 3-4 months ago  Nephropathy: eGFR 40   Neuropathy: None Macrovascular Complications: Denies hx MI, CVA, PAD  Gastroparesis:None ARB: Lisinopril  20 mg once a day  Statin: Atorvastatin  Lab Results  Component Value Date   LDLCALC 53 05/28/2023    Review of Systems:  Complete ROS obtained and pertinent positives noted in the HPI  PMH, PSH, Social Hx, Family Hx, Meds and Allergies  I have independently reviewed the patient's past medical history, pastsurgical history, medication list, and social history noted below.   Past Medical History:  Diagnosis Date  . Abdominal mass   . Anemia   . Benign essential hypertension 07/06/2015  . 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 Colon cancer screening 12/14/2017  . DDD (degenerative disc disease), lumbar   . Diabetes mellitus (CMD)   . Dry eye syndrome of both lacrimal glands   . 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 GAD (generalized anxiety disorder) 12/26/2018  . History of vitamin D   deficiency 09/10/2017  . Hypertension with goal to be determined   . Hyperthyroidism, subclinical 11/25/2018  . Iron deficiency anemia 11/29/2018   Labs in 11/2018  . Keratoconjunctivitis sicca of both eyes not specified as Sjogren's 08/07/2017  . Long term current use of oral hypoglycemic drug 08/07/2017  . Macular drusen, bilateral 08/07/2017  . 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 Mixed hyperlipidemia 11/23/2018  . Myopia of both eyes 08/07/2017  . Nausea and vomiting 12/26/2018  . Nuclear sclerotic cataract of both eyes 08/07/2017  . Obesity 12/09/2010   Last Assessment & Plan:  Will check CMP and lipid panel once she has orange card, orders entered as future.  Counseled on importance of exercise and dietary changes.  . Type 2 diabetes mellitus without complication, without long-term current use of insulin  (CMD) 08/07/2017  . Vitreous floater, bilateral 08/07/2017   Past Surgical History:  Procedure Laterality Date  . TUBAL LIGATION     Procedure: TUBAL LIGATION   Family History  Problem Relation Name Age of Onset  . Breast cancer Sister    . Leukemia Brother    . Blindness Neg Hx    . Glaucoma Neg Hx    . Macular degeneration Neg Hx     Social History   Tobacco Use  . Smoking status: Never  . Smokeless tobacco: Never  Vaping Use  . Vaping status: Never Used  Substance Use Topics  . Alcohol use: No  . Drug use: No     Medications: Current Outpatient Medications  Medication Instructions  .  acetaminophen  (TYLENOL ) 650 mg, Every 6 hours PRN  . amLODIPine  (NORVASC ) 5 mg, oral, Daily  . aspirin  81 mg, Daily  . atorvastatin  (LIPITOR) 40 mg, oral, At bedtime  . gabapentin (NEURONTIN) 300 mg, oral, Nightly  . glimepiride (AMARYL) 1 mg, oral, Every morning before breakfast  . glucose blood (Accu-Chek Guide test strips) test strip USE TO CHECK BLOOD SUGAR 1 TIME  DAILY  . insulin  glargine (LANTUS  SOLOSTAR) 10 Units, subcutaneous, Daily  . lisinopriL   (PRINIVIL ) 20 mg, oral, Every morning  . magnesium  oxide 400 mg (241 mg magnesium ) tab Take 1 tablet by mouth once daily  . metFORMIN  (GLUCOPHAGE ) 500 mg, oral, 2 times daily with meals  . omeprazole  (PRILOSEC) 40 mg, Daily PRN  . ondansetron  (ZOFRAN ) 4 mg, oral, Every 8 hours PRN  . Ozempic  0.5 mg, subcutaneous, Weekly    Allergies: Allergies  Allergen Reactions  . Ferumoxytol Angioedema  . Allopurinol Analogues Other (See Comments)    Neutropenia  . Dulaglutide  Other (See Comments)    Headache  . Duloxetine Other (See Comments)    Constipation   . Ozempic  [Semaglutide ] GI Intolerance  . Shellfish Containing Products Itching    Data    Physical Exam: General: Alert, pleasant, well-nourished, no acute distress HEENT: NCAT, EOMI, vision grossly intact, no LAD  CV: RRR, no murmurs, rubs or gallops Resp: Normal respiratory effort, lungs clear to auscultation bilaterally Abd: Soft, nontender, nondistended  Neuro: CN2-12 grossly intact, answers questions appropriately  Pysch: Pleasant mood, appropriate affect Extremities: Lower extremities warm, well perfused, no edema noted Skin: no rashes or wounds noted     Pertinent Labs: A1c Lab Results  Component Value Date   HGBA1C 12.6 (H) 09/28/2023   HGBA1C 9.0 (H) 05/28/2023   HGBA1C 7.7 (H) 01/28/2023   HGBA1C 8.1 (H) 01/27/2023   HGBA1C 7.6 (H) 05/30/2021   HGBA1C 7.6 (H) 05/30/2021   HGBA1C 8.6 (H) 12/24/2020   HGBA1C 8.6 (H) 12/24/2020   HGBA1C 8.1 (H) 09/18/2020   HGBA1C 8.1 (H) 09/18/2020    LIPIDS Lab Results  Component Value Date   CHOL 132 05/28/2023   TRIG 122 05/28/2023   HDL 59 (L) 05/28/2023   LDLCALC 53 05/28/2023    Lab Results  Component Value Date   LDLCALC 53 05/28/2023    MICROALBUMIN No results found for: MACKEY CURRENT  BMP/Cr/GFR Lab Results  Component Value Date   GLUCOSE 449 (H) 09/28/2023   CALCIUM  8.9 09/28/2023   NA 136 09/28/2023   K 4.0 09/28/2023   CO2 28  09/28/2023   CL 101 09/28/2023   BUN 16 09/28/2023   CREATININE 1.50 (H) 09/28/2023    Lab Results  Component Value Date   CREATININE 1.50 (H) 09/28/2023    Assessment  Brandy Lynn is a 58 y.o.  female with a past medical history of above who presents for an initial consultation visit.    Plan   1) Type 2 Diabetes, NOT controlled, complicated by CKD - Current hemoglobin A1c goal is *7%*.   BG monitoring: None Enc to test first thing upon waking  Labs: DMLabs: BMP, Fructosamine, and C-Peptide + serum glucose GAD65, IA2  Meds:  Increase Lantus  to 15 units at night Check blood sugar in the mornings If your blood sugar is above 150 mg/dl 2 days in a row, you will increase your Lantus  dose by 2 units, this will be your new dose Continue checking blood sugar fasting and increasing insulin  as above Continue Metformin  at 500 mg  twice a day for now  Continue Glimepiride for now Please do labs 1 week before next visit  Referrals  -Referral to CDE for Carbohydrate counting -Referral to [facility]   Education/Counseling:  -Patient advised to check blood sugars once a day in the morning

## 2023-10-21 NOTE — Progress Notes (Signed)
 Subjective:  Patient ID: Brandy Lynn is a 58 y.o. female.  HPI: Brandy Lynn comes today for Emergency room follow up. She was seen at [facility] on 10/20/23 for Hyperglycemia. She was treated with insulin . She is also c/o URI symptoms for 4 days. In the ER she was tested for Flu, RSV  and COVID which  were negative. She is c/o right pink eye with watering and discomfort for 4 days. She states it is starting now in the left eye. She denies dysphagia, painful swallowing, N/V, abdominal pain, hematochezia or melena. No CP, SOB or leg pain with exertion. No PND, orthopnea, lightheadedness, presyncope or syncope. No wheezing, cough. No hypoglycemia. She has been checking sugars. She has no new complaints.  The following portions of the patient's history were reviewed and updated as appropriate: allergies, current medications, past family history, past medical history, past social history, past surgical history, and problem list.  Review of Systems  Constitutional:  Negative for chills, fatigue and fever.  HENT:  Negative for ear pain, hearing loss, sinus pressure and sore throat.   Eyes:  Positive for discharge and redness. Negative for visual disturbance.  Respiratory:  Negative for chest tightness and shortness of breath.   Cardiovascular:  Negative for chest pain, palpitations and leg swelling.  Gastrointestinal:  Negative for abdominal pain, blood in stool, diarrhea, nausea and vomiting.  Endocrine: Negative for cold intolerance, heat intolerance, polydipsia, polyphagia and polyuria.  Genitourinary:  Negative for dysuria.  Musculoskeletal:  Negative for back pain and myalgias.  Skin:  Negative for rash.  Neurological:  Negative for dizziness, syncope, light-headedness and headaches.  Psychiatric/Behavioral:  Negative for behavioral problems.     Objective Physical Exam Vitals and nursing note reviewed.  Constitutional:      Appearance: Normal appearance.  HENT:     Head: Normocephalic  and atraumatic.  Eyes:     General:        Right eye: Discharge (Clear. Mild conjunctiva injection) present.     Extraocular Movements: Extraocular movements intact.     Pupils: Pupils are equal, round, and reactive to light.  Neck:     Vascular: No carotid bruit.  Cardiovascular:     Rate and Rhythm: Normal rate and regular rhythm.     Heart sounds: Normal heart sounds. No murmur heard.    No gallop.  Pulmonary:     Effort: Pulmonary effort is normal.     Breath sounds: Normal breath sounds. No wheezing or rhonchi.  Abdominal:     General: Bowel sounds are normal.     Palpations: Abdomen is soft.     Tenderness: There is no abdominal tenderness.  Musculoskeletal:        General: Normal range of motion.     Cervical back: Normal range of motion and neck supple. No rigidity or tenderness.     Right lower leg: No edema.     Left lower leg: No edema.  Lymphadenopathy:     Cervical: No cervical adenopathy.  Skin:    General: Skin is warm.     Findings: No erythema.  Neurological:     General: No focal deficit present.     Mental Status: She is alert and oriented to person, place, and time.  Psychiatric:        Mood and Affect: Mood normal.        Behavior: Behavior normal.        Assessment   1. Encounter for examination following treatment at hospital (Primary)  She was seen at [facility] on 10/20/23 for Hyperglycemia. She was treated with insulin . She was recommend by Endocrinology to increase Lantus  by 1 unit  every 2-3 days. She states she has increased to 15 units. Due to her GFR, metformin  was decreased.    Plan:  Advised to work on diet and also continue to titrate Lantus  up.    2. Acute conjunctivitis of right eye, unspecified acute conjunctivitis type   PLAN: Erythomycin eye ointment apply tid for 5-6 days.  Plan See above.  This document serves as a record of services personally performed by Dr. Delilah. It was created on their behalf by Marylynn Tedi Arts, CMA, a trained medical scribe, and Certified Medical Assistance (CMA). During the course of documenting the history, physical exam and medical decision making, I was functioning as a Stage manager. The creation of this record is the provider's dictation and/or activities during the visit.  Electronically signed by Marylynn Tedi Arts, CMA 10/21/2023 12:12 PM

## 2023-11-12 NOTE — Progress Notes (Signed)
 [facility]  - [facility]  [address] [place] [address]                                      11/12/23 Patient name: Brandy Lynn  Date of birth: Nov 10, 1965   SUBJECTIVE:  Chief Complaint  Patient presents with  . Cough   History of Present Illness Ms. Sadlowski is a 58 year old female who presents for evaluation of a productive cough that has persisted for 3 weeks.  She reports a persistent productive cough accompanied by rhinorrhea, which has recently transitioned to a greenish hue. She also experiences nasal congestion and thick, green nasal discharge. Her symptoms are particularly bothersome at night, leading to fatigue due to disrupted sleep.  Initially, she experienced a sore throat, which has since resolved, but she continues to experience throat irritation and itchiness. She reports no fever but acknowledges shortness of breath during severe coughing episodes. She does not have a history of seasonal allergies or asthma.She also reports tinnitus but no associated ear pain. Despite attempts to manage her symptoms with Robitussin and Chlor-Trimeton, she has found no relief. She has tried cough drops without success. She has previously used Mucinex  Max and Robitussin Nighttime, but these did not alleviate her symptoms. She has Flonase  nasal spray available for use.  Reports she typically develops a yeast infection when taking antibiotics.  BP Readings from Last 3 Encounters:  11/12/23 125/68  11/11/23 120/60  10/21/23 124/79    Lab Results  Component Value Date   HGBA1C 12.6 (H) 09/28/2023   HGBA1C 9.0 (H) 05/28/2023   HGBA1C 7.7 (H) 01/28/2023    Lab Results  Component Value Date   LDLCALC 53 05/28/2023   LDLCALC 50 01/27/2023    Lab Results  Component Value Date   CHOL 132 05/28/2023   HDL 59 (L) 05/28/2023   LDLCALC 53 05/28/2023   TRIG 122 05/28/2023      The 10-year ASCVD risk score (Arnett DK,  et al., 2019) is: 7.9%   Values used to calculate the score:     Age: 60 years     Sex: Female     Is Non-Hispanic African American: Yes     Diabetic: Yes     Tobacco smoker: No     Systolic Blood Pressure: 125 mmHg     Is BP treated: Yes     HDL Cholesterol: 59 mg/dL     Total Cholesterol: 132 mg/dL  HISTORY: Ihave reviewed the allergies, current medications, past medical and surgical history, family and social history, problem list, and updated as needed.  Allergies Allergies  Allergen Reactions  . Ferumoxytol Angioedema  . Allopurinol Analogues Other (See Comments)    Neutropenia  . Dulaglutide  Other (See Comments)    Headache  . Duloxetine Other (See Comments)    Constipation   . Ozempic  [Semaglutide ] GI Intolerance  . Shellfish Containing Products Itching    Medications Current Outpatient Medications  Medication Sig Dispense Refill  . Accu-Chek Guide Glucose Meter misc See Admin Instructions.    . acetaminophen  (TYLENOL ) 325 mg tablet Take 650 mg by mouth every 6 (six) hours as needed.    . amLODIPine  (NORVASC ) 5 mg tablet Take 1 tablet (5 mg total) by mouth daily. 90 tablet 1  . aspirin  81 mg EC tablet Take 81 mg by mouth Once Daily.    SABRA  atorvastatin  (LIPITOR) 40 mg tablet Take 1 tablet (40 mg total) by mouth nightly. 90 tablet 1  . glimepiride (AMARYL) 1 mg tablet Take 1 tablet (1 mg total) by mouth every morning before breakfast. 90 tablet 3  . glucose blood (Accu-Chek Guide test strips) test strip USE TO CHECK BLOOD SUGAR 1 TIME  DAILY 200 each 1  . insulin  glargine (LANTUS  SoloStar) 100 unit/mL (3 mL) pen Inject 10 Units under the skin daily. 15 mL 1  . lisinopriL  (PRINIVIL ) 20 mg tablet Take 1 tablet (20 mg total) by mouth every morning. 90 tablet 1  . magnesium  oxide 400 mg (241 mg magnesium ) tab Take 1 tablet by mouth once daily 90 tablet 0  . metFORMIN  (GLUCOPHAGE ) 500 mg tablet Take 1 tablet (500 mg total) by mouth in the morning and 1 tablet (500 mg total)  in the evening. Take with meals. 180 tablet 3  . omeprazole  (PriLOSEC) 40 mg DR capsule Take 40 mg by mouth daily as needed.    . ondansetron  (ZOFRAN ) 4 mg tablet Take 1 tablet (4 mg total) by mouth every 8 (eight) hours as needed for nausea or vomiting. 20 tablet 1  . amoxicillin -pot clavulanate (AUGMENTIN ) 875-125 mg per tablet Take 1 tablet by mouth 2 (two) times a day for 10 days. 20 tablet 0  . benzonatate  (TESSALON ) 200 mg capsule Take 1 capsule (200 mg total) by mouth 3 (three) times a day as needed for cough. 20 capsule 0  . blood-glucose meter,receiver,continuous (FreeStyle Libre 3 Reader) misc Dispense 1 freestyle libre 3 reader    . blood-glucose meter,receiver,continuous (FreeStyle Libre 3 Reader) misc Dispense 1 freestyle libre 3 reader 1 each 0  . blood-glucose sensor (FreeStyle Libre 3 Plus Sensor) 1 each by miscellaneous route every 14 (fourteen) days. 6 each 3  . fluconazole  (DIFLUCAN ) 150 mg tablet Take 1 tablet (150 mg total) by mouth every third day as needed (yeast infection). 2 tablet 0   No current facility-administered medications for this visit.    Family history Family History  Problem Relation Name Age of Onset  . Breast cancer Sister    . Leukemia Brother    . Blindness Neg Hx    . Glaucoma Neg Hx    . Macular degeneration Neg Hx      Social history Social History   Tobacco Use  Smoking Status Never  Smokeless Tobacco Never    Surgical history Past Surgical History:  Procedure Laterality Date  . TUBAL LIGATION     Procedure: TUBAL LIGATION    Preventive Care Health Maintenance  Topic Date Due  . HIV Screening  Never done  . Hepatitis C Screening  Never done  . Hepatitis B Vaccines (1 of 3 - 19+ 3-dose series) Never done  . ZOSTER VACCINE (1 of 2) Never done  . Pneumococcal Vaccine for Ages 50+ (2 of 2 - PCV) 01/10/2015  . Colorectal Cancer Screening  12/04/2018  . Breast Cancer Screening (Mammogram)  12/31/2022  . Diabetes: Foot Exam   09/24/2023  . Comprehensive Annual Visit  10/23/2023  . Influenza Vaccine (1) 02/15/2024 (Originally 03/19/2023)  . COVID-19 Vaccine (4 - 2024-25 season) 09/27/2024 (Originally 04/19/2023)  . Diabetes: Hemoglobin A1C  12/26/2023  . DTaP/Tdap/Td Vaccines (2 - Td or Tdap) 06/14/2024  . Diabetes: Retinopathy Screening Combo  07/06/2024  . Diabetes:  Quantitative uACR for Kidney Evaluation  09/28/2024  . Diabetes:  eGFR for Kidney Evaluation  10/19/2024  . Depression Screening  10/20/2024  .  Cervical Cancer Screening  10/23/2027  . Adult RSV (60+ Years or Pregnancy) (1 - 1-dose 75+ series) 02/18/2041  . HIB Vaccines  Aged Out  . IPV Vaccines  Aged Out  . Hepatitis A Vaccines  Aged Out  . Meningococcal Conjugate (ACWY) Vaccine  Aged Out  . Rotavirus Vaccines  Aged Out  . HPV Vaccines  Aged Out  . Meningococcal B Vaccine  Aged Out  . Diabetes Screening  Discontinued     Immunization History  Administered Date(s) Administered  . Influenza, Injectable, Quadrivalent, Preservative Free 05/28/2021  . Influenza, Unspecified 06/06/2008, 05/28/2010, 05/18/2013  . Pfizer SARS-CoV-2 Primary Series 12+ yrs 11/06/2019, 11/27/2019, 09/14/2020  . Pneumococcal Polysaccharide Vaccine, 23 Valent (PNEUMOVAX-23) 2Y+ 01/09/2014  . TDAP VACCINE (BOOSTRIX,ADACEL) 7Y+ 06/14/2014     ROS  Review of Systems  Constitutional:  Positive for fatigue. Negative for appetite change, chills and fever.  HENT:  Positive for congestion and rhinorrhea. Negative for postnasal drip, sinus pressure, sinus pain and sore throat.   Eyes:  Negative for visual disturbance.  Respiratory:  Positive for cough. Negative for chest tightness, shortness of breath and wheezing.   Cardiovascular:  Negative for chest pain and palpitations.  Gastrointestinal:  Negative for abdominal pain, blood in stool, diarrhea, nausea and vomiting.  Genitourinary:  Negative for difficulty urinating, dysuria, flank pain and hematuria.  Skin:  Negative  for rash and wound.  Neurological:  Negative for dizziness, seizures, syncope and headaches.  Psychiatric/Behavioral:  Negative for agitation, behavioral problems, confusion, self-injury and suicidal ideas.   All other systems reviewed and are negative.   Review of Systems - All other systems reviewed are negative except as noted above.  OBJECTIVE  BP 125/68 (BP Location: Right arm, Patient Position: Sitting)   Pulse 109   Ht 1.626 m (5' 4)   Wt 76.2 kg (168 lb)   SpO2 98%   BMI 28.84 kg/m    PHYSICAL:  Physical Exam Vitals and nursing note reviewed.  Constitutional:      General: She is not in acute distress.    Appearance: Normal appearance.  HENT:     Head: Normocephalic and atraumatic.     Right Ear: Tympanic membrane normal.     Left Ear: Tympanic membrane normal.     Nose: No congestion or rhinorrhea.     Right Sinus: No maxillary sinus tenderness or frontal sinus tenderness.     Left Sinus: Maxillary sinus tenderness present. No frontal sinus tenderness.     Mouth/Throat:     Pharynx: No oropharyngeal exudate or posterior oropharyngeal erythema.  Eyes:     Pupils: Pupils are equal, round, and reactive to light.  Neck:     Vascular: No carotid bruit.  Cardiovascular:     Rate and Rhythm: Normal rate and regular rhythm.     Heart sounds: No murmur heard.    No friction rub. No gallop.  Pulmonary:     Effort: Pulmonary effort is normal. No respiratory distress.     Breath sounds: No wheezing, rhonchi or rales.  Abdominal:     General: Bowel sounds are normal.     Palpations: Abdomen is soft.     Tenderness: There is no abdominal tenderness.  Musculoskeletal:     Cervical back: Normal range of motion and neck supple.     Right lower leg: No edema.     Left lower leg: No edema.  Skin:    General: Skin is warm.     Findings: No rash.  Neurological:     General: No focal deficit present.     Mental Status: She is alert and oriented to person, place, and time.   Psychiatric:        Mood and Affect: Mood normal.        Behavior: Behavior normal.    ASSESSMENT/PLAN:  1. Bronchitis (Primary) - amoxicillin -pot clavulanate (AUGMENTIN ) 875-125 mg per tablet; Take 1 tablet by mouth 2 (two) times a day for 10 days.  Dispense: 20 tablet; Refill: 0 - fluconazole  (DIFLUCAN ) 150 mg tablet; Take 1 tablet (150 mg total) by mouth every third day as needed (yeast infection).  Dispense: 2 tablet; Refill: 0 - benzonatate  (TESSALON ) 200 mg capsule; Take 1 capsule (200 mg total) by mouth 3 (three) times a day as needed for cough.  Dispense: 20 capsule; Refill: 0  2. Subacute maxillary sinusitis Start Flonase  nasal spray, - amoxicillin -pot clavulanate (AUGMENTIN ) 875-125 mg per tablet; Take 1 tablet by mouth 2 (two) times a day for 10 days.  Dispense: 20 tablet; Refill: 0 - fluconazole  (DIFLUCAN ) 150 mg tablet; Take 1 tablet (150 mg total) by mouth every third day as needed (yeast infection).  Dispense: 2 tablet; Refill: 0   Return if symptoms worsen or fail to improve.  Goals of care discussed with patient including medication compliance and adequate follow up. Patient verbalizesunderstanding and in agreement with the above plan. All questions answered.   I agree the documentation is accurate and complete.   Future Appointments  Date Time Provider Department Center  12/14/2023  2:40 PM Heron Barrio [place], NP [facility]  12/22/2023  1:20 PM [facility] GASTRO [facility] [address] GI NURSE [facility] GAS [facility]  01/06/2024  2:10 PM Comer Pillow [place], MD [facility] OPH [facility] [address] NUD  03/28/2024  1:40 PM Helyn Alston, MD [facility] NEP PRE [facility]   This document serves as a record of services personally performed by Dr. Cesario.  It was created on their behalf by Ronal LITTIE Ring, CMA, a trained medical scribe, and Certified Medical Assistant (CMA). During the course of documenting the history, physical exam and medical decision making, I was functioning as a Stage manager.  The creation of this record is the provider's dictation and/or activities during the visit.     This document was created using the aidof voice recognition Scientist, clinical (histocompatibility and immunogenetics).

## 2024-01-20 NOTE — Progress Notes (Signed)
 HPI: This is a f/up for a patient who was last seen by  me on 11/11/23 for type 2 diabetes.  Last A1C was 12.6% 09/28/23. Today's A1C is 10.1%.  she is not checking her BG at this time.  She just returned from a 1 month long trip to [place] and [place].    Patient denies any nausea or vomiting.   Diet/Exercise: Working on her diet; will start an exercise program EYE EXAM: up to date,  due in 6 months from date of exam  Medications include: Lantus  15 units Glimiperide 1 mg Metformin  500 mg BID  The following portions of the patient's history were reviewed and updated as appropriate: allergies, current medications, past medical history, past social history and problem list.  Current Outpatient Medications  Medication Instructions  . Accu-Chek Guide Glucose Meter misc See admin instructions  . acetaminophen  (TYLENOL ) 650 mg, Every 6 hours PRN  . amLODIPine  (NORVASC ) 5 mg, oral, Daily  . aspirin  81 mg, Daily  . atorvastatin  (LIPITOR) 40 mg, oral, At bedtime  . blood-glucose meter,receiver,continuous (FreeStyle [place] 3 Reader) misc Dispense 1 freestyle libre 3 reader  . blood-glucose meter,receiver,continuous (FreeStyle [place] 3 Reader) misc Dispense 1 freestyle libre 3 reader  . blood-glucose sensor (FreeStyle Lib[place] 3 Plus Sensor) 1 each, miscellaneous, Every 14 days  . fluconazole  (DIFLUCAN ) 150 mg, oral, Every 72 hours PRN  . glimepiride (AMARYL) 1 mg, oral, Every morning before breakfast  . glucose blood (Accu-Chek Guide test strips) test strip USE TO CHECK BLOOD SUGAR 1 TIME  DAILY  . insulin  glargine (LANTUS  SOLOSTAR) 15 Units, subcutaneous, Daily  . Lancets (Accu-Chek Softclix Lancets) misc Use 3 times daily as directed.  . lisinopriL  (PRINIVIL ) 20 mg, oral, Every morning  . magnesium  oxide 400 mg (241 mg magnesium ) tab Take 1 tablet by mouth once daily  . metFORMIN  (GLUCOPHAGE ) 500 mg, oral, 2 times daily with meals  . omeprazole  (PRILOSEC) 40 mg, Daily PRN  . ondansetron  (ZOFRAN ) 4  mg, oral, Every 8 hours PRN  . pen needle, diabetic 31 gauge x 5/16 ndle For use up to 4/day for insulin  injections    Allergies  Allergen Reactions  . Ferumoxytol Angioedema  . Allopurinol Analogues Other (See Comments)    Neutropenia  . Dulaglutide  Other (See Comments)    Headache  . Duloxetine Other (See Comments)    Constipation   . Ozempic  [Semaglutide ] GI Intolerance  . Shellfish Containing Products Itching    Patient Active Problem List   Diagnosis Date Noted  Date Diagnosed  . Microalbuminuria 09/29/2023   . Cervical radiculopathy 02/25/2023   . Foraminal stenosis of cervical region 02/25/2023   . Lumbar radiculopathy 02/25/2023   . Benign hypertension with chronic kidney disease, stage III (CMD) 05/30/2021   . Adjustment insomnia 06/15/2020   . Low back pain 12/27/2019   . Overweight with body mass index (BMI) of 27 to 27.9 in adult 12/13/2019   . Anemia in stage 3b chronic kidney disease (CMD) 07/07/2019   . Hypomagnesemia 07/07/2019   . 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 GAD (generalized anxiety disorder) 12/26/2018   . Iron deficiency anemia 11/29/2018     Overview Note:    Labs in 11/2018    . Hyperthyroidism, subclinical 11/25/2018   . DDD (degenerative disc disease), lumbar 11/23/2018   . Mixed hyperlipidemia 11/23/2018   . History of vitamin D  deficiency 09/10/2017   . Type 2 diabetes mellitus with hyperglycemia, without long-term current use of insulin  (  HCC) 08/07/2017   . Long term current use of oral hypoglycemic drug 08/07/2017   . Nuclear sclerotic cataract of both eyes 08/07/2017   . Keratoconjunctivitis sicca of both eyes not specified as Sjogren's 08/07/2017   . Myopia of both eyes 08/07/2017   . 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 Macular drusen, bilateral 08/07/2017   . Mesenteric mass 03/12/2017     Overview Note:    ?Dermoid mass ?Followed at [facility]    . Benign essential hypertension 07/06/2015   . Obesity 12/09/2010      Overview Note:    Last Assessment & Plan:  Will check CMP and lipid panel once she has orange card, orders entered as  future.  Counseled on importance of exercise and dietary changes.      Resolved Problems   Diagnosis Date Noted Date Resolved Date Diagnosed  . Vitreous floater, bilateral 08/07/2017 12/22/2022     ROS: Appropriate review of system was done and it was negative except for as mentioned in HPI.   PHYSICAL EXAM:  General: Alert, pleasant, well-nourished, no acute distress HEENT:  vision grossly intact, no LAD  CV: RRR,  no murmurs, rubs or gallops Resp: Normal respiratory effort, lungs clear to auscultation bilaterally Abd: Soft, nontender, nondistended, BS present, no lipohypertrophy present  Neuro: CN2-12 grossly intact, answers questions appropriately  Pysch: Pleasant mood, appropriate affect Extremities: Lower extremities warm, well perfused, no edema noted Skin: no rashes or wounds noted FEET: Intact pedal pulses, no lesions   LABS/RADIOLOGY/MEDICAL RECORDS:     Recent Results (from the past 16 weeks)  POC Glucose (Hemocue/NOVA)   Collection Time: 10/14/23  2:09 PM  Result Value Ref Range   Glucose, POC 262 (A) 70 - 125 mg/dL   Kit/Device Lot # [id number]    Kit/Device Expiration Date 2737974   POC Glucose   Collection Time: 10/20/23 10:55 PM  Result Value Ref Range   Glucose, POC 494 (HH) 70 - 99 mg/dL  Urinalysis with Reflex to Microscopic   Collection Time: 10/20/23 11:39 PM  Result Value Ref Range   Color, Urine Colorless Yellow   Clarity, Urine Clear Clear   Specific Gravity, Urine 1.010 1.005 - 1.025   pH, Urine 7.0 5.0 - 8.0   Protein, Urine Negative Negative, 10 , 20  mg/dL   Glucose, Urine >8999 (A) Negative, 30 , 50  mg/dL   Ketones, Urine Negative Negative, Trace mg/dL   Bilirubin, Urine Negative Negative   Blood, Urine Negative Negative, Trace   Nitrite, Urine Negative Negative   Leukocyte Esterase, Urine Negative Negative, 25    Urobilinogen, Urine Normal <2.0 mg/dL  Comprehensive Metabolic Panel   Collection Time: 10/20/23 11:52 PM  Result Value Ref Range   Sodium 136 136 - 145 mmol/L   Potassium 3.8 3.4 - 4.5 mmol/L   Chloride 100 98 - 107 mmol/L   CO2 29 21 - 31 mmol/L   Anion Gap 7 6 - 14 mmol/L   Glucose, Random 440 (H) 70 - 99 mg/dL   Blood Urea Nitrogen (BUN) 17 7 - 25 mg/dL   Creatinine 8.41 (H) 9.39 - 1.20 mg/dL   eGFR 38 (L) >40 fO/fpw/8.26f7   Albumin 4.5 3.5 - 5.7 g/dL   Total Protein 7.1 6.4 - 8.9 g/dL   Bilirubin, Total 0.3 0.3 - 1.0 mg/dL   Alkaline Phosphatase (ALP) 75 34 - 104 U/L   Aspartate Aminotransferase (AST) 16 13 - 39 U/L   Alanine Aminotransferase (ALT) 16 7 - 52  U/L   Calcium  9.6 8.6 - 10.3 mg/dL   BUN/Creatinine Ratio 10.8 10.0 - 20.0  CBC with Differential   Collection Time: 10/20/23 11:52 PM  Result Value Ref Range   WBC 5.14 4.40 - 11.00 10*3/uL   RBC 3.88 (L) 4.10 - 5.10 10*6/uL   Hemoglobin 11.3 (L) 12.3 - 15.3 g/dL   Hematocrit 66.4 (L) 64.0 - 44.6 %   Mean Corpuscular Volume (MCV) 86.3 80.0 - 96.0 fL   Mean Corpuscular Hemoglobin (MCH) 29.0 27.5 - 33.2 pg   Mean Corpuscular Hemoglobin Conc (MCHC) 33.6 33.0 - 37.0 g/dL   Red Cell Distribution Width (RDW) 13.4 12.3 - 17.0 %   Platelet Count (PLT) 176 150 - 450 10*3/uL   Mean Platelet Volume (MPV) 10.2 6.8 - 10.2 fL   Neutrophils % 54 %   Lymphocytes % 27 %   Monocytes % 12 %   Eosinophils % 6 %   Basophils % 1 %   Neutrophils Absolute 2.80 1.80 - 7.80 10*3/uL   Lymphocytes # 1.40 1.00 - 4.80 10*3/uL   Monocytes # 0.60 0.00 - 0.80 10*3/uL   Eosinophils # 0.30 0.00 - 0.50 10*3/uL   Basophils # 0.00 0.00 - 0.20 10*3/uL  Blood Gas, Venous   Collection Time: 10/20/23 11:53 PM  Result Value Ref Range   pH, Blood Gas 7.338 7.320 - 7.420   pCO2, Venous 57.9 None Defined mmHg   pO2, Venous <30.1 None Defined mmHg   HCO3, Blood Gas 31 (H) 21 - 28 mmol/L   Base Deficit (-) / Base Excess 5.2 (H) -2.0 - 2.0 mmol/L   O2  Saturation, Venous (measured) 43.2 (L) 70.0 - 80.0 %   Source, Blood Gas Venous   SARS-Cov-2, Flu, and RSV, Qualitative NAAT   Collection Time: 10/21/23 12:28 AM  Result Value Ref Range   SARS-CoV-2 Negative Negative   Influenza A Negative Negative   Influenza B Negative Negative   RSV Negative Negative  POC Glucose   Collection Time: 10/21/23  1:57 AM  Result Value Ref Range   Glucose, POC 104 (H) 70 - 99 mg/dL  POC Glucose   Collection Time: 10/21/23  2:55 AM  Result Value Ref Range   Glucose, POC 172 (H) 70 - 99 mg/dL    ASSESSMENT/PLAN: 1. Type 2 diabetes mellitus with hyperglycemia, without long-term current use of insulin  (HCC) (Primary) Patient will start an exercise program Increase to 20 units of Lantus  Please check BG once a day in the morning  2. Benign essential hypertension BP is at goal  RTC 6 weeks

## 2024-03-08 NOTE — Progress Notes (Signed)
 HPI: This is a f/up for a patient who was last seen by me on 11/11/23 for type 2 diabetes.  Last A1C was 10.8% 01/20/24  Patient denies any nausea or vomiting.   Medications include: Lantus  20 units Glimiperide 1 mg Metformin  500 mg BID  The following portions of the patient's history were reviewed and updated as appropriate: allergies, current medications, past medical history, past social history and problem list.  Current Outpatient Medications  Medication Instructions  . Accu-Chek Guide Glucose Meter misc See admin instructions  . acetaminophen  (TYLENOL ) 650 mg, Every 6 hours PRN  . amLODIPine  (NORVASC ) 5 mg, oral, Daily  . aspirin  81 mg, Daily  . atorvastatin  (LIPITOR) 40 mg, oral, At bedtime  . blood-glucose meter,receiver,continuous (FreeStyle [place] 3 Reader) misc Dispense 1 freestyle libre 3 reader  . blood-glucose meter,receiver,continuous (FreeStyle [place] 3 Reader) misc Dispense 1 freestyle libre 3 reader  . blood-glucose sensor (FreeStyle Libre 3 Plus Sensor) 1 each, miscellaneous, Every 14 days  . fluconazole  (DIFLUCAN ) 150 mg, oral, Every 72 hours PRN  . glimepiride (AMARYL) 1 mg, oral, Every morning before breakfast  . glucose blood (Accu-Chek Guide test strips) test strip USE TO CHECK BLOOD SUGAR 1 TIME  DAILY  . insulin  glargine (LANTUS  SOLOSTAR) 15 Units, subcutaneous, Daily  . Lancets (Accu-Chek Softclix Lancets) misc Use 3 times daily as directed.  . lisinopriL  (PRINIVIL ) 20 mg, oral, Every morning  . magnesium  oxide 400 mg (241 mg magnesium ) tab Take 1 tablet by mouth once daily  . metFORMIN  (GLUCOPHAGE ) 500 mg, oral, 2 times daily with meals  . omeprazole  (PRILOSEC) 40 mg, Daily PRN  . ondansetron  (ZOFRAN ) 4 mg, oral, Every 8 hours PRN  . pen needle, diabetic 31 gauge x 5/16 ndle For use up to 4/day for insulin  injections    Allergies[1]  Patient Active Problem List   Diagnosis Date Noted   . Microalbuminuria 09/29/2023  . Cervical radiculopathy 02/25/2023   . Foraminal stenosis of cervical region 02/25/2023  . Lumbar radiculopathy 02/25/2023  . Benign hypertension with chronic kidney disease, stage III (CMD) 05/30/2021  . Adjustment insomnia 06/15/2020  . Low back pain 12/27/2019  . Overweight with body mass index (BMI) of 27 to 27.9 in adult 12/13/2019  . Anemia in stage 3b chronic kidney disease (CMD) 07/07/2019  . Hypomagnesemia 07/07/2019  . 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 GAD (generalized anxiety disorder) 12/26/2018  . Iron deficiency anemia 11/29/2018    Overview Note:    Labs in 11/2018    . Hyperthyroidism, subclinical 11/25/2018  . DDD (degenerative disc disease), lumbar 11/23/2018  . Mixed hyperlipidemia 11/23/2018  . History of vitamin D  deficiency 09/10/2017  . Type 2 diabetes mellitus with hyperglycemia, with long-term current use of insulin     (CMD) 08/07/2017  . Long term current use of oral hypoglycemic drug 08/07/2017  . Nuclear sclerotic cataract of both eyes 08/07/2017  . Keratoconjunctivitis sicca of both eyes not specified as Sjogren's 08/07/2017  . Myopia of both eyes 08/07/2017  . 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 Macular drusen, bilateral 08/07/2017  . Mesenteric mass 03/12/2017    Overview Note:    ?Dermoid mass ?Followed at [facility]    . Benign essential hypertension 07/06/2015  . Obesity 12/09/2010    Overview Note:    Last Assessment & Plan:  Will check CMP and lipid panel once she has orange card, orders entered as  future.  Counseled on importance of exercise and dietary changes.  Resolved Problems   Diagnosis Date Noted Date Resolved  . Vitreous floater, bilateral 08/07/2017 12/22/2022    ROS: Appropriate review of system was done and it was negative except for as mentioned in HPI.   PHYSICAL EXAM:  General: Alert, pleasant, well-nourished, no acute distress HEENT: vision grossly intact, no LAD  CV: RRR, no murmurs, rubs or gallops Resp: Normal respiratory  effort, lungs clear to auscultation bilaterally Abd: Soft, nontender, not distended Neuro: CN2-12 grossly intact, answers questions appropriately  Pysch: Pleasant mood, appropriate affect Extremities: Lower extremities warm, well perfused, no edema noted Skin: no rashes or wounds noted    LABS/RADIOLOGY/MEDICAL RECORDS:    Recent Results (from the past 16 weeks)  POC Glucose (Hemocue/NOVA)   Collection Time: 01/20/24  3:52 PM  Result Value Ref Range   Glucose, POC 296 (A) 70 - 125 mg/dL   Kit/Device Lot # [id number]    Kit/Device Expiration Date 112025   POC Hemoglobin A1c   Collection Time: 01/20/24  3:54 PM  Result Value Ref Range   Hemoglobin A1c (POC) 10.8 (A) 4.2 - 5.6 %   A1C Information      A1C Diagnostic Criteria: Prediabetes:5.7% - 6.4% Diabetes:>=6.5% A1C Glycemic Goals: Older Adults: <7.0% Children and Adolescents: <7.0% Pregnant Women: <6.0%   Kit/Device Lot # [id number]    Kit/Device Expiration Date 22027      ASSESSMENT/PLAN: 1. Type 2 diabetes mellitus with hyperglycemia, with long-term current use of insulin     (CMD) (Primary) BG 202 today in the office (this was about 2 hours pp) She does not check her BG, declines the CGM Increase glimepiride to 2 mg Ozempic  made her sick  2. Benign essential hypertension At goal  3. Mixed hyperlipidemia Previous lipid panel was at goal Will recheck        [1] Allergies Allergen Reactions  . Ferumoxytol Angioedema  . Allopurinol Analogues Other (See Comments)    Neutropenia  . Dulaglutide  Other (See Comments)    Headache  . Duloxetine Other (See Comments)    Constipation   . Ozempic  [Semaglutide ] GI Intolerance  . Shellfish Containing Products Itching

## 2024-05-08 ENCOUNTER — Other Ambulatory Visit: Payer: Self-pay

## 2024-05-08 ENCOUNTER — Emergency Department (HOSPITAL_BASED_OUTPATIENT_CLINIC_OR_DEPARTMENT_OTHER): Payer: Self-pay

## 2024-05-08 ENCOUNTER — Encounter (HOSPITAL_BASED_OUTPATIENT_CLINIC_OR_DEPARTMENT_OTHER): Payer: Self-pay | Admitting: Emergency Medicine

## 2024-05-08 ENCOUNTER — Emergency Department (HOSPITAL_BASED_OUTPATIENT_CLINIC_OR_DEPARTMENT_OTHER)
Admission: EM | Admit: 2024-05-08 | Discharge: 2024-05-08 | Disposition: A | Discharge status: Home / Self Care | Payer: Self-pay

## 2024-05-08 DIAGNOSIS — N762 Acute vulvitis: Secondary | ICD-10-CM | POA: Insufficient documentation

## 2024-05-08 DIAGNOSIS — M5416 Radiculopathy, lumbar region: Secondary | ICD-10-CM | POA: Insufficient documentation

## 2024-05-08 DIAGNOSIS — Z7984 Long term (current) use of oral hypoglycemic drugs: Secondary | ICD-10-CM | POA: Insufficient documentation

## 2024-05-08 DIAGNOSIS — Z79899 Other long term (current) drug therapy: Secondary | ICD-10-CM | POA: Insufficient documentation

## 2024-05-08 DIAGNOSIS — E119 Type 2 diabetes mellitus without complications: Secondary | ICD-10-CM | POA: Insufficient documentation

## 2024-05-08 DIAGNOSIS — R102 Pelvic and perineal pain: Secondary | ICD-10-CM | POA: Insufficient documentation

## 2024-05-08 DIAGNOSIS — I10 Essential (primary) hypertension: Secondary | ICD-10-CM | POA: Insufficient documentation

## 2024-05-08 DIAGNOSIS — Z7982 Long term (current) use of aspirin: Secondary | ICD-10-CM | POA: Insufficient documentation

## 2024-05-08 LAB — CBC WITH DIFFERENTIAL/PLATELET
Abs Immature Granulocytes: 0.01 K/uL (ref 0.00–0.07)
Basophils Absolute: 0 K/uL (ref 0.0–0.1)
Basophils Relative: 1 %
Eosinophils Absolute: 0.2 K/uL (ref 0.0–0.5)
Eosinophils Relative: 4 %
HCT: 30.9 % — ABNORMAL LOW (ref 36.0–46.0)
Hemoglobin: 10.5 g/dL — ABNORMAL LOW (ref 12.0–15.0)
Immature Granulocytes: 0 %
Lymphocytes Relative: 29 %
Lymphs Abs: 1.3 K/uL (ref 0.7–4.0)
MCH: 28.5 pg (ref 26.0–34.0)
MCHC: 34 g/dL (ref 30.0–36.0)
MCV: 83.7 fL (ref 80.0–100.0)
Monocytes Absolute: 0.5 K/uL (ref 0.1–1.0)
Monocytes Relative: 12 %
Neutro Abs: 2.4 K/uL (ref 1.7–7.7)
Neutrophils Relative %: 54 %
Platelets: 192 K/uL (ref 150–400)
RBC: 3.69 MIL/uL — ABNORMAL LOW (ref 3.87–5.11)
RDW: 12.9 % (ref 11.5–15.5)
WBC: 4.3 K/uL (ref 4.0–10.5)
nRBC: 0 % (ref 0.0–0.2)

## 2024-05-08 LAB — BASIC METABOLIC PANEL WITH GFR
Anion gap: 10 (ref 5–15)
BUN: 16 mg/dL (ref 6–20)
CO2: 26 mmol/L (ref 22–32)
Calcium: 9 mg/dL (ref 8.9–10.3)
Chloride: 103 mmol/L (ref 98–111)
Creatinine, Ser: 1.4 mg/dL — ABNORMAL HIGH (ref 0.44–1.00)
GFR, Estimated: 43 mL/min — ABNORMAL LOW (ref >60)
Glucose, Bld: 260 mg/dL — ABNORMAL HIGH (ref 70–99)
Potassium: 4.2 mmol/L (ref 3.5–5.1)
Sodium: 139 mmol/L (ref 135–145)

## 2024-05-08 MED ORDER — LACTATED RINGERS IV BOLUS
1000.0000 mL | Freq: Once | INTRAVENOUS | Status: AC
  Administered 2024-05-08: 1000 mL via INTRAVENOUS

## 2024-05-08 MED ORDER — CYCLOBENZAPRINE HCL 10 MG PO TABS: 10.0000 mg | ORAL_TABLET | Freq: Two times a day (BID) | ORAL | 0 refills | Status: AC | PRN

## 2024-05-08 MED ORDER — DOXYCYCLINE HYCLATE 100 MG PO CAPS: 100.0000 mg | ORAL_CAPSULE | Freq: Two times a day (BID) | ORAL | 0 refills | Status: AC

## 2024-05-08 MED ORDER — ONDANSETRON HCL 4 MG/2ML IJ SOLN
4.0000 mg | Freq: Once | INTRAMUSCULAR | Status: AC
  Administered 2024-05-08: 4 mg via INTRAVENOUS
  Filled 2024-05-08: qty 2

## 2024-05-08 MED ORDER — MORPHINE SULFATE (PF) 4 MG/ML IV SOLN
4.0000 mg | Freq: Once | INTRAVENOUS | Status: AC
  Administered 2024-05-08: 4 mg via INTRAVENOUS
  Filled 2024-05-08: qty 1

## 2024-05-08 MED ORDER — IOHEXOL 300 MG/ML  SOLN
100.0000 mL | Freq: Once | INTRAMUSCULAR | Status: AC | PRN
  Administered 2024-05-08: 100 mL via INTRAVENOUS

## 2024-05-08 MED ORDER — DOXYCYCLINE HYCLATE 100 MG PO TABS
100.0000 mg | ORAL_TABLET | Freq: Once | ORAL | Status: AC
  Administered 2024-05-08: 100 mg via ORAL
  Filled 2024-05-08: qty 1

## 2024-05-08 NOTE — ED Notes (Signed)
 Reviewed discharge instructions, medications and follow up. Pt states understanding. Ambulatory at discharge. Family to transport home

## 2024-05-08 NOTE — ED Provider Notes (Signed)
 [place] [facility] AT [facility] Provider Note   CSN: [id number] Arrival date & time: 05/08/24  9277     Patient presents with: Hip Pain (? abscess)   Brandy Lynn is a 58 y.o. female.   58 year old female with past medical history of poorly controlled diabetes and hypertension presenting to the emergency department today with pain in her back as well as to her left hip.  She also states she has been having some pain in her left labia over the past 2 to 3 days.  The patient denies any recent injuries.  Denies any bowel or bladder dysfunction and has not had any saddle anesthesia.  She came to the emergency department today for further evaluation regarding this.  She denies any fevers or chills.  Looking back through her most recent primary care notes looks like she does have relatively poorly controlled diabetes.   Hip Pain       Prior to Admission medications   Medication Sig Start Date End Date Taking? Authorizing Provider  cyclobenzaprine  (FLEXERIL ) 10 MG tablet Take 1 tablet (10 mg total) by mouth 2 (two) times daily as needed for muscle spasms. 05/08/24  Yes Ula Prentice SAUNDERS, MD  doxycycline  (VIBRAMYCIN ) 100 MG capsule Take 1 capsule (100 mg total) by mouth 2 (two) times daily. 05/08/24  Yes Ula Prentice SAUNDERS, MD  amLODipine  (NORVASC ) 5 MG tablet Take 1 tablet by mouth once daily 02/05/23   Beck, Taylor B, NP  aspirin  EC 81 MG tablet Take 81 mg by mouth daily.    [provider]  atorvastatin  (LIPITOR) 40 MG tablet Take 1 tablet (40 mg total) by mouth daily. 07/23/22   Almarie Waddell NOVAK, NP  azithromycin  (ZITHROMAX ) 250 MG tablet Take 2 tabs the first day and then 1 tab daily until you run out. 05/15/22   Frann, Mabel Mt, DO  Cholecalciferol  (VITAMIN D3) 125 MCG (5000 UT) CAPS Take 1 capsule (5,000 Units total) by mouth daily. 03/12/22   Almarie Waddell NOVAK, NP  Dulaglutide  (TRULICITY ) 0.75 MG/0.5ML SOPN Inject 0.75 mg into the skin once a week. 07/23/22    Almarie Waddell NOVAK, NP  fluconazole  (DIFLUCAN ) 150 MG tablet Take 1 tab, repeat in 72 hours if no improvement. 05/15/22   Frann Mabel Mt, DO  folic acid  (FOLVITE ) 1 MG tablet Take 1 tablet (1 mg total) by mouth daily. 09/23/21   Franchot Lauraine HERO, NP  lisinopril  (ZESTRIL ) 20 MG tablet Take 1 tablet (20 mg total) by mouth daily. 04/07/22   Almarie Waddell NOVAK, NP  magnesium  oxide (MAG-OX) 400 (240 Mg) MG tablet Take 1 tablet (400 mg total) by mouth daily. 09/10/22   Almarie Waddell NOVAK, NP  metFORMIN  (GLUCOPHAGE ) 1000 MG tablet TAKE 1 TABLET BY MOUTH TWICE DAILY WITH MEALS 02/05/23   Almarie Waddell B, NP  ondansetron  (ZOFRAN  ODT) 8 MG disintegrating tablet Take 1 tablet (8 mg total) by mouth every 8 (eight) hours as needed for nausea or vomiting. 09/02/20   Molpus, John, MD  pantoprazole  (PROTONIX ) 40 MG tablet Take 1 tablet (40 mg total) by mouth daily. 09/10/22   Almarie Waddell NOVAK, NP  promethazine -dextromethorphan  (PROMETHAZINE -DM) 6.25-15 MG/5ML syrup Take 5 mLs by mouth 4 (four) times daily as needed for cough. 05/15/22   Frann Mabel Mt, DO  tobramycin  (TOBREX ) 0.3 % ophthalmic solution Place 2 drops into both eyes every 6 (six) hours. 10/16/21   Frann Mabel Mt, DO    Allergies: Allopurinol, Ferumoxytol, Shellfish allergy, and Duloxetine  Review of Systems  Musculoskeletal:  Positive for back pain.  All other systems reviewed and are negative.   Updated Vital Signs BP 129/89 (BP Location: Left Arm)   Pulse 99   Temp 98.2 F (36.8 C) (Oral)   Resp 18   LMP 12/30/2013   SpO2 100%   Physical Exam Vitals and nursing note reviewed.   Gen: Appears uncomfortable Eyes: PERRL, EOMI HEENT: no oropharyngeal swelling Neck: trachea midline Resp: clear to auscultation bilaterally Card: RRR, no murmurs, rubs, or gallops Abd: nontender, nondistended Extremities: no calf tenderness, no edema MSK: Tender over the mid and lower lumbar spine in the midline with no step-offs or deformities GU:  Chaperoned by female nursing staff shows normal-appearing external genitalia with a raised lesion noted over the left labia with no induration or fluctuance, no surrounding erythema noted, no crepitus Vascular: 2+ radial pulses bilaterally, 2+ DP pulses bilaterally Neuro: Equal strength and sensation throughout bilateral lower extremities with normal patellar and Achilles reflexes noted Skin: no rashes Psyc: acting appropriately   (all labs ordered are listed, but only abnormal results are displayed) Labs Reviewed  CBC WITH DIFFERENTIAL/PLATELET - Abnormal; Notable for the following components:      Result Value   RBC 3.69 (*)    Hemoglobin 10.5 (*)    HCT 30.9 (*)    All other components within normal limits  BASIC METABOLIC PANEL WITH GFR - Abnormal; Notable for the following components:   Glucose, Bld 260 (*)    Creatinine, Ser 1.40 (*)    GFR, Estimated 43 (*)    All other components within normal limits    EKG: None  Radiology: DG Lumbar Spine Complete Result Date: 05/08/2024 CLINICAL DATA:  Pain Left hip pain and ? Abscess to labia EXAM: LUMBAR SPINE - COMPLETE 4+ VIEW COMPARISON:  CT pelvis 05/08/2024 FINDINGS: Limited evaluation due to overlapping osseous structures and overlying soft tissues. There is no evidence of lumbar spine fracture. Multilevel mild to moderate degenerative changes of the spine. No associated severe osseous neural foraminal or central canal stenosis. Alignment is normal. Intervertebral disc spaces are maintained. Calcified likely injection granulomas along the left gluteal soft tissues. IMPRESSION: No acute displaced fracture or traumatic listhesis of the lumbar spine. Electronically Signed   By: Morgane  Naveau M.D.   On: 05/08/2024 09:32   CT PELVIS W CONTRAST Result Date: 05/08/2024 CLINICAL DATA:  Soft tissue infection suspected, pelvis, xray done Labial pain, hx poorly controlled DM, no obvious abscess EXAM: CT PELVIS WITH CONTRAST TECHNIQUE:  Multidetector CT imaging of the pelvis was performed using the standard protocol following the bolus administration of intravenous contrast. RADIATION DOSE REDUCTION: This exam was performed according to the departmental dose-optimization program which includes automated exposure control, adjustment of the mA and/or kV according to patient size and/or use of iterative reconstruction technique. CONTRAST:  OMNIPAQUE  IOHEXOL  300 MG/ML  SOLN COMPARISON:  CT abdomen pelvis 12/31/2022 FINDINGS: Urinary Tract:  No abnormality visualized. Bowel: Colonic diverticulosis. Otherwise unremarkable visualized pelvic bowel loops. Vascular/Lymphatic: Slightly asymmetrically more prominent nonenlarged left inguinal lymph nodes. No pathologically enlarged lymph nodes. No significant vascular abnormality seen. Reproductive: The uterus and bilateral adnexal regions are unremarkable. No mass or other significant abnormality Other: No intraperitoneal free fluid. No intraperitoneal free gas. No organized fluid collection. Musculoskeletal: Bilateral gluteal tissue calcified granulomas. Trace left inguinal and vulvar subcutaneus soft tissue edema (10:60 2-80). No subcutaneus soft tissue emphysema. No organized fluid collection. No overlying dermal thickening. No acute displaced fracture  or dislocation of either wrist. No acute displaced fracture or diastasis of the bones of the pelvis. No aggressive appearing osseous lesion. IMPRESSION: 1. Trace left inguinal and vulvar subcutaneus soft tissue edema. Slightly asymmetrically more prominent nonenlarged left inguinal lymph nodes. No associated subcutaneus soft tissue edema organized fluid collection. No overlying dermal thickening. 2. No acute intrapelvic abnormality. Electronically Signed   By: Morgane  Naveau M.D.   On: 05/08/2024 09:31     Procedures   Medications Ordered in the ED  morphine  (PF) 4 MG/ML injection 4 mg (4 mg Intravenous Given 05/08/24 0802)  ondansetron  (ZOFRAN )  injection 4 mg (4 mg Intravenous Given 05/08/24 0802)  iohexol  (OMNIPAQUE ) 300 MG/ML solution 100 mL (100 mLs Intravenous Contrast Given 05/08/24 0901)  lactated ringers  bolus 1,000 mL (1,000 mLs Intravenous New Bag/Given 05/08/24 0953)  doxycycline  (VIBRA -TABS) tablet 100 mg (100 mg Oral Given 05/08/24 0950)                                    Medical Decision Making 58 year old female with past medical history of poorly controlled diabetes and hypertension presenting to the emergency department today with back pain as well as some pain in the left labia going into the left leg.  I do not appreciate a Bartholin cyst or abscess here.  Given her poorly controlled diabetes will obtain a CT scan to evaluate for deeper infection.  Will obtain basic labs here.  Will give her morphine  Zofran  check an x-ray of her lumbar spine further evaluation for bony abnormalities.  She does not have any red flag symptoms for cauda equina syndrome or cord compressing lesion at this time.  Neurovascular exam is reassuring.  I will reevaluate for ultimate disposition.  The patient's workup here is reassuring.  There is no deep space infection findings noted but findings consistent with cellulitis.  Patient is covered with doxycycline .  Does have mild AKI and is given IV fluids.  She will be discharged with symptomatic treatment with return precautions.  Amount and/or Complexity of Data Reviewed Labs: ordered. Radiology: ordered.  Risk Prescription drug management.        Final diagnoses:  Vulvar cellulitis  Lumbar radiculopathy    ED Discharge Orders          Ordered    doxycycline  (VIBRAMYCIN ) 100 MG capsule  2 times daily        05/08/24 1050    cyclobenzaprine  (FLEXERIL ) 10 MG tablet  2 times daily PRN        05/08/24 1050               Ula Prentice SAUNDERS, MD 05/08/24 1051

## 2024-05-08 NOTE — Discharge Instructions (Addendum)
 Your CT scan showed cellulitis.  Please take the doxycycline  as prescribed.  Take Tylenol  for pain and the Flexeril  as needed for the back pain.  Please follow-up with your doctor and return to the ER for worsening symptoms.

## 2024-05-08 NOTE — ED Triage Notes (Signed)
 Left hip pain and ? Abscess to labia

## 2024-05-09 ENCOUNTER — Other Ambulatory Visit: Payer: Self-pay

## 2024-05-09 ENCOUNTER — Encounter (HOSPITAL_BASED_OUTPATIENT_CLINIC_OR_DEPARTMENT_OTHER): Payer: Self-pay | Admitting: Emergency Medicine

## 2024-05-09 ENCOUNTER — Emergency Department (HOSPITAL_BASED_OUTPATIENT_CLINIC_OR_DEPARTMENT_OTHER)
Admission: EM | Admit: 2024-05-09 | Discharge: 2024-05-09 | Disposition: A | Discharge status: Home / Self Care | Payer: Self-pay | Attending: Emergency Medicine | Admitting: Emergency Medicine

## 2024-05-09 DIAGNOSIS — E119 Type 2 diabetes mellitus without complications: Secondary | ICD-10-CM | POA: Insufficient documentation

## 2024-05-09 DIAGNOSIS — Z7982 Long term (current) use of aspirin: Secondary | ICD-10-CM | POA: Insufficient documentation

## 2024-05-09 DIAGNOSIS — A6004 Herpesviral vulvovaginitis: Secondary | ICD-10-CM | POA: Insufficient documentation

## 2024-05-09 DIAGNOSIS — Z7984 Long term (current) use of oral hypoglycemic drugs: Secondary | ICD-10-CM | POA: Insufficient documentation

## 2024-05-09 MED ORDER — ACYCLOVIR 200 MG PO CAPS
400.0000 mg | ORAL_CAPSULE | Freq: Once | ORAL | Status: AC
  Administered 2024-05-09: 400 mg via ORAL
  Filled 2024-05-09: qty 2

## 2024-05-09 MED ORDER — HYDROCODONE-ACETAMINOPHEN 5-325 MG PO TABS: 1.0000 | ORAL_TABLET | Freq: Four times a day (QID) | ORAL | 0 refills | Status: AC | PRN

## 2024-05-09 MED ORDER — ACYCLOVIR 400 MG PO TABS: 400.0000 mg | ORAL_TABLET | Freq: Three times a day (TID) | ORAL | 0 refills | Status: AC

## 2024-05-09 NOTE — Discharge Instructions (Addendum)
 No sexual activity during an [place]

## 2024-05-09 NOTE — ED Provider Notes (Signed)
 [facility] [place] Provider Note   CSN: [id number] Arrival date & time: 05/09/24  9475     Patient presents with: Follow-up   Brandy Lynn is a 58 y.o. female.   The history is provided by the patient.  Rash Location:  Pelvis Pelvic rash location:  Vulva Quality: blistering and painful   Pain details:    Quality:  Stinging and burning   Severity:  Severe   Onset quality:  Gradual   Duration:  1 day   Timing:  Constant   Progression:  Worsening Onset quality:  Gradual Duration:  1 day Timing:  Constant Progression:  Worsening Chronicity:  New Context: not insect bite/sting and not medications   Associated symptoms: no fever   Patient with Diabetes Mellitus seen earlier for pain in hip and groin and started on doxycycline  which is not helping pain.       Prior to Admission medications   Medication Sig Start Date End Date Taking? Authorizing Provider  acyclovir  (ZOVIRAX ) 400 MG tablet Take 1 tablet (400 mg total) by mouth 3 (three) times daily. 05/09/24  Yes Rayford Williamsen, MD  HYDROcodone -acetaminophen  (NORCO/VICODIN) 5-325 MG tablet Take 1 tablet by mouth every 6 (six) hours as needed for severe pain (pain score 7-10). 05/09/24  Yes Reyne Falconi, MD  amLODipine  (NORVASC ) 5 MG tablet Take 1 tablet by mouth once daily 02/05/23   Beck, Taylor B, NP  aspirin  EC 81 MG tablet Take 81 mg by mouth daily.    [provider]  atorvastatin  (LIPITOR) 40 MG tablet Take 1 tablet (40 mg total) by mouth daily. 07/23/22   Almarie Waddell NOVAK, NP  azithromycin  (ZITHROMAX ) 250 MG tablet Take 2 tabs the first day and then 1 tab daily until you run out. 05/15/22   Frann, Mabel Mt, DO  Cholecalciferol  (VITAMIN D3) 125 MCG (5000 UT) CAPS Take 1 capsule (5,000 Units total) by mouth daily. 03/12/22   Almarie Waddell NOVAK, NP  cyclobenzaprine  (FLEXERIL ) 10 MG tablet Take 1 tablet (10 mg total) by mouth 2 (two) times daily as needed for muscle spasms.  05/08/24   Ula Prentice SAUNDERS, MD  doxycycline  (VIBRAMYCIN ) 100 MG capsule Take 1 capsule (100 mg total) by mouth 2 (two) times daily. 05/08/24   Ula Prentice SAUNDERS, MD  Dulaglutide  (TRULICITY ) 0.75 MG/0.5ML SOPN Inject 0.75 mg into the skin once a week. 07/23/22   Almarie Waddell NOVAK, NP  fluconazole  (DIFLUCAN ) 150 MG tablet Take 1 tab, repeat in 72 hours if no improvement. 05/15/22   Frann Mabel Mt, DO  folic acid  (FOLVITE ) 1 MG tablet Take 1 tablet (1 mg total) by mouth daily. 09/23/21   Franchot Lauraine HERO, NP  lisinopril  (ZESTRIL ) 20 MG tablet Take 1 tablet (20 mg total) by mouth daily. 04/07/22   Almarie Waddell NOVAK, NP  magnesium  oxide (MAG-OX) 400 (240 Mg) MG tablet Take 1 tablet (400 mg total) by mouth daily. 09/10/22   Almarie Waddell NOVAK, NP  metFORMIN  (GLUCOPHAGE ) 1000 MG tablet TAKE 1 TABLET BY MOUTH TWICE DAILY WITH MEALS 02/05/23   Almarie Waddell NOVAK, NP  ondansetron  (ZOFRAN  ODT) 8 MG disintegrating tablet Take 1 tablet (8 mg total) by mouth every 8 (eight) hours as needed for nausea or vomiting. 09/02/20   Molpus, John, MD  pantoprazole  (PROTONIX ) 40 MG tablet Take 1 tablet (40 mg total) by mouth daily. 09/10/22   Almarie Waddell NOVAK, NP  promethazine -dextromethorphan  (PROMETHAZINE -DM) 6.25-15 MG/5ML syrup Take 5 mLs by mouth 4 (four)  times daily as needed for cough. 05/15/22   Frann Mabel Mt, DO  tobramycin  (TOBREX ) 0.3 % ophthalmic solution Place 2 drops into both eyes every 6 (six) hours. 10/16/21   Frann Mabel Mt, DO    Allergies: Allopurinol, Ferumoxytol, Shellfish allergy, and Duloxetine    Review of Systems  Constitutional:  Negative for fever.  Skin:  Positive for rash.  All other systems reviewed and are negative.   Updated Vital Signs BP (!) 149/68   Pulse 81   Temp 98.8 F (37.1 C) (Oral)   Resp 18   Ht 5' 4 (1.626 m)   Wt 72.6 kg   LMP 12/30/2013   SpO2 100%   BMI 27.46 kg/m   Physical Exam Vitals and nursing note reviewed. Exam conducted with a chaperone present.   Constitutional:      General: She is not in acute distress.    Appearance: Normal appearance. She is well-developed.  HENT:     Head: Normocephalic and atraumatic.     Nose: Nose normal.  Eyes:     Pupils: Pupils are equal, round, and reactive to light.  Cardiovascular:     Rate and Rhythm: Normal rate and regular rhythm.     Pulses: Normal pulses.     Heart sounds: Normal heart sounds.  Pulmonary:     Effort: Pulmonary effort is normal. No respiratory distress.     Breath sounds: Normal breath sounds.  Abdominal:     General: Bowel sounds are normal. There is no distension.     Palpations: Abdomen is soft.     Tenderness: There is no abdominal tenderness. There is no guarding or rebound.  Genitourinary:  Musculoskeletal:        General: Normal range of motion.     Cervical back: Normal range of motion and neck supple.  Skin:    General: Skin is warm and dry.     Capillary Refill: Capillary refill takes less than 2 seconds.     Findings: No erythema or rash.  Neurological:     General: No focal deficit present.     Mental Status: She is alert and oriented to person, place, and time.     Deep Tendon Reflexes: Reflexes normal.  Psychiatric:        Mood and Affect: Mood normal.     (all labs ordered are listed, but only abnormal results are displayed) Labs Reviewed - No data to display  EKG: None  Radiology: DG Lumbar Spine Complete Result Date: 05/08/2024 CLINICAL DATA:  Pain Left hip pain and ? Abscess to labia EXAM: LUMBAR SPINE - COMPLETE 4+ VIEW COMPARISON:  CT pelvis 05/08/2024 FINDINGS: Limited evaluation due to overlapping osseous structures and overlying soft tissues. There is no evidence of lumbar spine fracture. Multilevel mild to moderate degenerative changes of the spine. No associated severe osseous neural foraminal or central canal stenosis. Alignment is normal. Intervertebral disc spaces are maintained. Calcified likely injection granulomas along the left  gluteal soft tissues. IMPRESSION: No acute displaced fracture or traumatic listhesis of the lumbar spine. Electronically Signed   By: Morgane  Naveau M.D.   On: 05/08/2024 09:32   CT PELVIS W CONTRAST Result Date: 05/08/2024 CLINICAL DATA:  Soft tissue infection suspected, pelvis, xray done Labial pain, hx poorly controlled DM, no obvious abscess EXAM: CT PELVIS WITH CONTRAST TECHNIQUE: Multidetector CT imaging of the pelvis was performed using the standard protocol following the bolus administration of intravenous contrast. RADIATION DOSE REDUCTION: This exam was performed according  to the departmental dose-optimization program which includes automated exposure control, adjustment of the mA and/or kV according to patient size and/or use of iterative reconstruction technique. CONTRAST:  OMNIPAQUE  IOHEXOL  300 MG/ML  SOLN COMPARISON:  CT abdomen pelvis 12/31/2022 FINDINGS: Urinary Tract:  No abnormality visualized. Bowel: Colonic diverticulosis. Otherwise unremarkable visualized pelvic bowel loops. Vascular/Lymphatic: Slightly asymmetrically more prominent nonenlarged left inguinal lymph nodes. No pathologically enlarged lymph nodes. No significant vascular abnormality seen. Reproductive: The uterus and bilateral adnexal regions are unremarkable. No mass or other significant abnormality Other: No intraperitoneal free fluid. No intraperitoneal free gas. No organized fluid collection. Musculoskeletal: Bilateral gluteal tissue calcified granulomas. Trace left inguinal and vulvar subcutaneus soft tissue edema (10:60 2-80). No subcutaneus soft tissue emphysema. No organized fluid collection. No overlying dermal thickening. No acute displaced fracture or dislocation of either wrist. No acute displaced fracture or diastasis of the bones of the pelvis. No aggressive appearing osseous lesion. IMPRESSION: 1. Trace left inguinal and vulvar subcutaneus soft tissue edema. Slightly asymmetrically more prominent nonenlarged  left inguinal lymph nodes. No associated subcutaneus soft tissue edema organized fluid collection. No overlying dermal thickening. 2. No acute intrapelvic abnormality. Electronically Signed   By: Morgane  Naveau M.D.   On: 05/08/2024 09:31     Procedures   Medications Ordered in the ED  acyclovir  (ZOVIRAX ) 200 MG capsule 400 mg (400 mg Oral Given 05/09/24 9447)                                    Medical Decision Making Patient with worsening vaginal pain   Amount and/or Complexity of Data Reviewed External Data Reviewed: labs, radiology and notes.    Details: Previous ED visit reviewed   Risk Prescription drug management. Risk Details: Chaperoned exam.  Multiple vesicular lesions on the left labia which are consistent with herpes.  Pain is consistent with this as well.  Will start Acyclovir  and pain medication.  [facility]  Stable for discharge.       Final diagnoses:  Herpes simplex vulvovaginitis   No signs of systemic illness or infection. The patient is nontoxic-appearing on exam and vital signs are within normal limits.  I have reviewed the triage vital signs and the nursing notes. Pertinent labs & imaging results that were available during my care of the patient were reviewed by me and considered in my medical decision making (see chart for details). After history, exam, and medical workup I feel the patient has been appropriately medically screened and is safe for discharge home. Pertinent diagnoses were discussed with the patient. Patient was given return precautions.  ED Discharge Orders          Ordered    acyclovir  (ZOVIRAX ) 400 MG tablet  3 times daily        05/09/24 0544    HYDROcodone -acetaminophen  (NORCO/VICODIN) 5-325 MG tablet  Every 6 hours PRN        05/09/24 0544               Elynore Dolinski, MD 05/09/24 [id number]

## 2024-05-09 NOTE — ED Triage Notes (Signed)
 Pt seen yesterday and reports no resolution of her symptoms.
